# Patient Record
Sex: Female | Born: 1985
Health system: Southern US, Community
[De-identification: ages and names within clinical notes are randomized; demographics above are authoritative.]

## PROBLEM LIST (undated history)

## (undated) DIAGNOSIS — N926 Irregular menstruation, unspecified: Secondary | ICD-10-CM

## (undated) DIAGNOSIS — J45909 Unspecified asthma, uncomplicated: Secondary | ICD-10-CM

## (undated) DIAGNOSIS — N941 Unspecified dyspareunia: Secondary | ICD-10-CM

## (undated) DIAGNOSIS — E559 Vitamin D deficiency, unspecified: Secondary | ICD-10-CM

## (undated) DIAGNOSIS — F419 Anxiety disorder, unspecified: Secondary | ICD-10-CM

## (undated) DIAGNOSIS — D649 Anemia, unspecified: Secondary | ICD-10-CM

## (undated) DIAGNOSIS — R0602 Shortness of breath: Secondary | ICD-10-CM

## (undated) DIAGNOSIS — G43909 Migraine, unspecified, not intractable, without status migrainosus: Secondary | ICD-10-CM

## (undated) DIAGNOSIS — D4959 Neoplasm of unspecified behavior of other genitourinary organ: Secondary | ICD-10-CM

## (undated) DIAGNOSIS — D219 Benign neoplasm of connective and other soft tissue, unspecified: Secondary | ICD-10-CM

## (undated) DIAGNOSIS — E538 Deficiency of other specified B group vitamins: Secondary | ICD-10-CM

## (undated) DIAGNOSIS — R51 Headache: Secondary | ICD-10-CM

## (undated) DIAGNOSIS — Z87898 Personal history of other specified conditions: Secondary | ICD-10-CM

## (undated) DIAGNOSIS — IMO0002 Reserved for concepts with insufficient information to code with codable children: Secondary | ICD-10-CM

## (undated) HISTORY — DX: Benign neoplasm of connective and other soft tissue, unspecified: D21.9

## (undated) HISTORY — DX: Shortness of breath: R06.02

## (undated) HISTORY — DX: Personal history of other specified conditions: Z87.898

## (undated) HISTORY — DX: Vitamin D deficiency, unspecified: E55.9

## (undated) HISTORY — DX: Unspecified dyspareunia: N94.10

## (undated) HISTORY — DX: Deficiency of other specified B group vitamins: E53.8

## (undated) HISTORY — DX: Unspecified asthma, uncomplicated: J45.909

## (undated) HISTORY — DX: Anxiety disorder, unspecified: F41.9

## (undated) HISTORY — DX: Migraine, unspecified, not intractable, without status migrainosus: G43.909

## (undated) HISTORY — PX: HYSTEROSCOPY: SHX211

## (undated) HISTORY — DX: Reserved for concepts with insufficient information to code with codable children: IMO0002

## (undated) HISTORY — DX: Anemia, unspecified: D64.9

## (undated) HISTORY — DX: Irregular menstruation, unspecified: N92.6

---

## 2006-05-10 ENCOUNTER — Emergency Department: Payer: Self-pay | Admitting: Emergency Medicine

## 2006-08-10 ENCOUNTER — Emergency Department: Payer: Self-pay | Admitting: Emergency Medicine

## 2007-03-16 ENCOUNTER — Emergency Department: Payer: Self-pay | Admitting: Emergency Medicine

## 2008-04-06 ENCOUNTER — Emergency Department: Payer: Self-pay | Admitting: Emergency Medicine

## 2010-11-15 DIAGNOSIS — IMO0002 Reserved for concepts with insufficient information to code with codable children: Secondary | ICD-10-CM

## 2010-11-15 HISTORY — DX: Reserved for concepts with insufficient information to code with codable children: IMO0002

## 2010-12-20 ENCOUNTER — Other Ambulatory Visit: Payer: Self-pay | Admitting: Internal Medicine

## 2010-12-20 ENCOUNTER — Other Ambulatory Visit: Payer: Self-pay | Admitting: Obstetrics and Gynecology

## 2010-12-20 DIAGNOSIS — R1900 Intra-abdominal and pelvic swelling, mass and lump, unspecified site: Secondary | ICD-10-CM

## 2011-01-07 ENCOUNTER — Ambulatory Visit
Admission: RE | Admit: 2011-01-07 | Discharge: 2011-01-07 | Disposition: A | Discharge status: Home / Self Care | Payer: 59 | Source: Ambulatory Visit | Attending: Obstetrics and Gynecology | Admitting: Obstetrics and Gynecology

## 2011-01-07 DIAGNOSIS — R1900 Intra-abdominal and pelvic swelling, mass and lump, unspecified site: Secondary | ICD-10-CM

## 2011-01-07 MED ORDER — GADOBENATE DIMEGLUMINE 529 MG/ML IV SOLN
14.0000 mL | Freq: Once | INTRAVENOUS | Status: AC | PRN
  Administered 2011-01-07: 14 mL via INTRAVENOUS

## 2011-01-27 ENCOUNTER — Other Ambulatory Visit: Payer: Self-pay

## 2011-01-31 ENCOUNTER — Inpatient Hospital Stay (HOSPITAL_COMMUNITY)
Admission: AD | Admit: 2011-01-31 | Discharge: 2011-01-31 | Disposition: A | Discharge status: Home / Self Care | Payer: 59 | Source: Ambulatory Visit | Attending: Obstetrics and Gynecology | Admitting: Obstetrics and Gynecology

## 2011-01-31 ENCOUNTER — Encounter (HOSPITAL_COMMUNITY): Payer: Self-pay | Admitting: *Deleted

## 2011-01-31 ENCOUNTER — Other Ambulatory Visit: Payer: Self-pay | Admitting: Obstetrics and Gynecology

## 2011-01-31 DIAGNOSIS — D649 Anemia, unspecified: Secondary | ICD-10-CM | POA: Insufficient documentation

## 2011-01-31 DIAGNOSIS — R5383 Other fatigue: Secondary | ICD-10-CM

## 2011-01-31 DIAGNOSIS — D259 Leiomyoma of uterus, unspecified: Secondary | ICD-10-CM | POA: Insufficient documentation

## 2011-01-31 HISTORY — DX: Headache: R51

## 2011-01-31 HISTORY — DX: Neoplasm of unspecified behavior of other genitourinary organ: D49.59

## 2011-01-31 LAB — LIPASE, BLOOD: Lipase: 17 U/L (ref 11–59)

## 2011-01-31 LAB — COMPREHENSIVE METABOLIC PANEL
ALT: 51 U/L — ABNORMAL HIGH (ref 0–35)
CO2: 26 mEq/L (ref 19–32)
Calcium: 9.1 mg/dL (ref 8.4–10.5)
Creatinine, Ser: 0.87 mg/dL (ref 0.50–1.10)
GFR calc Af Amer: >90 mL/min (ref >90)
GFR calc non Af Amer: >90 mL/min (ref >90)
Glucose, Bld: 105 mg/dL — ABNORMAL HIGH (ref 70–99)

## 2011-01-31 LAB — DIFFERENTIAL
Eosinophils Relative: 2 % (ref 0–5)
Lymphocytes Relative: 35 % (ref 12–46)
Lymphs Abs: 2.2 10*3/uL (ref 0.7–4.0)
Monocytes Absolute: 0.1 10*3/uL (ref 0.1–1.0)

## 2011-01-31 LAB — CBC
HCT: 19.7 % — ABNORMAL LOW (ref 36.0–46.0)
Hemoglobin: 6.8 g/dL — CL (ref 12.0–15.0)
MCV: 109.4 fL — ABNORMAL HIGH (ref 78.0–100.0)
RBC: 1.8 MIL/uL — ABNORMAL LOW (ref 3.87–5.11)
WBC: 6.4 10*3/uL (ref 4.0–10.5)

## 2011-01-31 LAB — POCT PREGNANCY, URINE: Preg Test, Ur: NEGATIVE

## 2011-01-31 LAB — FOLATE: Folate: 5.5 ng/mL

## 2011-01-31 MED ORDER — LEUPROLIDE ACETATE (3 MONTH) 11.25 MG IM KIT
11.2500 mg | PACK | Freq: Once | INTRAMUSCULAR | Status: AC
Start: 2011-01-31 — End: 2011-01-31
  Administered 2011-01-31: 11.25 mg via INTRAMUSCULAR
  Filled 2011-01-31: qty 11.25

## 2011-01-31 MED ORDER — LACTATED RINGERS IV BOLUS (SEPSIS)
500.0000 mL | Freq: Once | INTRAVENOUS | Status: AC
  Administered 2011-01-31: 500 mL via INTRAVENOUS

## 2011-01-31 MED ORDER — LACTATED RINGERS IV SOLN
INTRAVENOUS | Status: DC
  Administered 2011-01-31: 12:00:00 via INTRAVENOUS

## 2011-01-31 MED ORDER — LEUPROLIDE ACETATE (3 MONTH) 11.25 MG IM KIT: 11.2500 mg | PACK | Freq: Once | INTRAMUSCULAR | Status: DC

## 2011-01-31 NOTE — ED Provider Notes (Addendum)
History   25yo well known by me in the office presents today after being seeing yesterday in office by Henreitta Leber, PA with c/o body aches prior to hysteroscopy and fatigue, especially with exertion and prolonged bleeding since October.  Bleeding has not been heavy but has been chronic.  She normally changes her pad 2-3 times per day.  Pt has also felt a little nausea and reported constipation until yesterday when she had a normal BM.  She reports positive flatus and is eating ok.  Denies fever, chills, nausea today or any bleeding today.  She had a negative pregnancy test in the office about 1-2 wks ago and has not had sex since August.  She had a normal pap smear and negative GC/CT in October.  W/u in office revealed normal TSH in November and Hct of approx 30.  An MRI revealed a 6cm fundal fibroid.  She c/o pain when she is bleeding.  The last bleeding episode she had was over the weekend but still only reported changing her pad 2-3x each day.  Chief Complaint  Patient presents with  . Anemia   HPI  OB History    Grav Para Term Preterm Abortions TAB SAB Ect Mult Living   0               Past Medical History  Diagnosis Date  . 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 Headache (reports no aura but will confirm with Neuro in March)     Past Surgical History  Procedure Date  . Hysteroscopy 01/19/11    History reviewed. No pertinent family history.  History  Substance Use Topics  . Smoking status: Never Smoker   . Smokeless tobacco: Not on file  . Alcohol Use: No    Allergies: No Known Allergies  Prescriptions prior to admission  Medication Sig Dispense Refill  . baclofen (LIORESAL) 10 MG tablet Take 10 mg by mouth 2 (two) times daily as needed. Muscle spasms       . chlorproMAZINE (THORAZINE) 25 MG tablet Take 25-50 mg by mouth daily as needed. For severe migraines       . HYDROcodone-acetaminophen (VICODIN) 2.5-500 MG per tablet Take 1 tablet by  mouth every 6 (six) hours as needed. For pain       . ibuprofen (ADVIL,MOTRIN) 800 MG tablet Take 800 mg by mouth every 8 (eight) hours as needed. For pain       . levonorgestrel-ethinyl estradiol (SEASONALE,INTROVALE,JOLESSA) 0.15-0.03 MG tablet Take 1 tablet by mouth daily.        . promethazine (PHENERGAN) 25 MG tablet Take 25 mg by mouth every 6 (six) hours as needed. nausea       . topiramate (TOPAMAX) 25 MG tablet Take 75 mg by mouth daily.          ROS As above.  Denies CP or SOB.  Physical Exam   Blood pressure 119/74, pulse 108, temperature 99.3 F (37.4 C), temperature source Oral, resp. rate 18, height 5' 6.5" (1.689 m), weight 73.211 kg (161 lb 6.4 oz), SpO2 98.00%.  Physical Exam Lungs CTA bil CV RRR ABD soft, nt, nabs EXT no calf tenderness  MAU Course  Procedures IVF Check labs UPT Depo Lupron   Assessment and Plan  25yo G0 with Symptomatic Anemia and Fundal fibroid.  Pt offered blood transfusion and different options for management.  The risks, benefits and alternatives discussed.  Pt says she has a class today  at 5:30p and wants to go but she wants the transfusion because she is symptomatic but she wants to return in am for it.  I informed patient that I did not think it was a good idea to drive by herself and recommended against it.  I even offered getting one unit today and the remaining two units tomorrow.  However, she would rather just do all three tomorrow and says she will be fine.  I informed her with the symptoms she is having she could possibly become light headed or dizzy at the wheel but still declines any transfusions for today.  She would like to get depo lupron however and reeval in about 3months for possible surgical management.  Initially discussed micronor for mgmt but pt would like to optimize her condition to be able to take her courses for this semester and reeval in a few months.  Pt has f/u in office schedule for 02/15/11 and instructed to keep  that appt.  Side effects of depo lupron reviewed including but not limited to bleeding, headaches, hot flashes, blood clots, mood swings, amenorrhea.... Also, it is not birth control and she should use condoms or some other birth control method and stop her seasonale.  Rec OTC iron BID.  Barrett Holthaus Y 01/31/2011, 1:25 PM   02/01/11 vitamin B12 was low at 95. Folate was normal.  Hepatitis panel still pending.  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  When pt presents for blood transfusion, I will also give of vitamin B12 after discussion and recommendations by pharmacist for dosing until pt can be seen by hematologist.

## 2011-01-31 NOTE — Progress Notes (Signed)
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  Had hysteroscopy on 01/19/11.  Still having bleeding since then.  Called the office yesterday for weakness and fatigue, had blood work drawn, was called this am and told results were abnormal and she was anemic and to come to MAU.  Pt does not know any of the results.

## 2011-01-31 NOTE — Progress Notes (Signed)
Patient states she has had bleeding since October. Was seen at [facility] 1-7 for lab work. Patient states she was called and told to come to MAU. Patient has been feeling tired and weak, Has some cramping on and off but none today. Was diagnosed with an ovarian tumor but has not had a discussion with Dr. Su Hilt about it.

## 2011-02-01 ENCOUNTER — Observation Stay (HOSPITAL_COMMUNITY)
Admission: AD | Admit: 2011-02-01 | Discharge: 2011-02-02 | Disposition: A | Discharge status: Home / Self Care | Payer: 59 | Source: Ambulatory Visit | Attending: Obstetrics and Gynecology | Admitting: Obstetrics and Gynecology

## 2011-02-01 ENCOUNTER — Encounter (HOSPITAL_COMMUNITY): Payer: Self-pay | Admitting: *Deleted

## 2011-02-01 ENCOUNTER — Other Ambulatory Visit: Payer: Self-pay | Admitting: Obstetrics and Gynecology

## 2011-02-01 DIAGNOSIS — D649 Anemia, unspecified: Principal | ICD-10-CM | POA: Insufficient documentation

## 2011-02-01 DIAGNOSIS — D518 Other vitamin B12 deficiency anemias: Secondary | ICD-10-CM | POA: Insufficient documentation

## 2011-02-01 LAB — TYPE AND SCREEN
Antibody Screen: NEGATIVE
Unit division: 0
Unit division: 0

## 2011-02-01 LAB — HEPATITIS PANEL, ACUTE: Hep A IgM: NEGATIVE

## 2011-02-01 LAB — CBC
MCV: 108.6 fL — ABNORMAL HIGH (ref 78.0–100.0)
Platelets: 197 10*3/uL (ref 150–400)
RBC: 1.74 MIL/uL — ABNORMAL LOW (ref 3.87–5.11)
WBC: 7.6 10*3/uL (ref 4.0–10.5)

## 2011-02-01 LAB — PREPARE RBC (CROSSMATCH)

## 2011-02-01 MED ORDER — DIPHENHYDRAMINE HCL 25 MG PO CAPS
25.0000 mg | ORAL_CAPSULE | Freq: Once | ORAL | Status: AC
  Administered 2011-02-01: 25 mg via ORAL
  Filled 2011-02-01: qty 1

## 2011-02-01 MED ORDER — VITAMIN B-12 1000 MCG PO TABS
1000.0000 ug | ORAL_TABLET | Freq: Once | ORAL | Status: AC
  Administered 2011-02-01: 1000 ug via ORAL
  Filled 2011-02-01: qty 1

## 2011-02-01 MED ORDER — ACETAMINOPHEN 325 MG PO TABS
650.0000 mg | ORAL_TABLET | Freq: Once | ORAL | Status: AC
  Administered 2011-02-01: 650 mg via ORAL
  Filled 2011-02-01: qty 2

## 2011-02-01 NOTE — Progress Notes (Signed)
Pt began complaining about IV site burning 10 minutes after blood transfusion rate increased to 150cc/hr.  Heat pack applied to IV site, but did not relieve symptom.  IV site appeared normal.  Blood transfusion rate reduced to 100cc/hr.  30 minutes later pt was sleeping.

## 2011-02-02 ENCOUNTER — Other Ambulatory Visit (HOSPITAL_COMMUNITY): Payer: Self-pay | Admitting: Obstetrics and Gynecology

## 2011-02-02 NOTE — Progress Notes (Signed)
Pt discharged home... Condition stable... No equipment... Ambulated to car with Payden Bonus Toms, RN.  

## 2011-02-02 NOTE — Progress Notes (Signed)
Called Dr. Normand Sloop to report that the third unit of blood had finished transfusing. Orders given to D/C pt and have her follow up with Dr. Su Hilt as scheduled. Pt stated that she has an appointment with Dr. Su Hilt on January 23rd, 2013 at 0800. Pt verbalized understanding.

## 2011-02-06 ENCOUNTER — Telehealth: Payer: Self-pay | Admitting: Hematology and Oncology

## 2011-02-06 NOTE — Telephone Encounter (Signed)
lmonvm for pt re calling me for appt w/LO 

## 2011-02-07 ENCOUNTER — Telehealth: Payer: Self-pay | Admitting: Hematology and Oncology

## 2011-02-07 NOTE — Telephone Encounter (Signed)
lmonvm for pt re calling me for appt w/LO 

## 2011-02-08 ENCOUNTER — Telehealth: Payer: Self-pay | Admitting: Hematology and Oncology

## 2011-02-08 NOTE — Telephone Encounter (Signed)
S/w pt today re appt for 1/23 @ 10 am w/LO

## 2011-02-09 NOTE — H&P (Signed)
Patient presented to follow through with care from the previous day when she presented to [facility]U.  She was unable to stay yesterday evening.  26yo well known by me in the office presents today after being seeing yesterday in office by Henreitta Leber, PA with c/o body aches prior to hysteroscopy and fatigue, especially with exertion and prolonged bleeding since October.  Bleeding has not been heavy but has been chronic.  She normally changes her pad 2-3 times per day.  Pt has also felt a little nausea and reported constipation until yesterday when she had a normal BM.  She reports positive flatus and is eating ok.  Denies fever, chills, nausea today or any bleeding today.  She had a negative pregnancy test in the office about 1-2 wks ago and has not had sex since August.  She had a normal pap smear and negative GC/CT in October.  W/u in office revealed normal TSH in November and Hct of approx 30.  An MRI revealed a 6cm fundal fibroid.  She c/o pain when she is bleeding.  The last bleeding episode she had was over the weekend but still only reported changing her pad 2-3x each day.    Chief Complaint   Patient presents with   .  Anemia    HPI    OB History      Grav  Para  Term  Preterm  Abortions  TAB  SAB  Ect  Mult  Living     0                        Past Medical History   Diagnosis  Date   .  Possible Ovarian tumor found to be fundal fibroid on MRI  12/2010   .  Headache (reports no aura but will confirm with Neuro in March)      Past Surgical History   Procedure  Date   .  Hysteroscopy  01/19/11    History reviewed. No pertinent family history.    History   Substance Use Topics   .  Smoking status:  Never Smoker    .  Smokeless tobacco:  Not on file   .  Alcohol Use:  No    Allergies: No Known Allergies    Prescriptions prior to admission   Medication  Sig  Dispense  Refill   .  baclofen (LIORESAL) 10 MG tablet  Take 10 mg by mouth 2 (two) times daily as needed. Muscle spasms           .  chlorproMAZINE (THORAZINE) 25 MG tablet  Take 25-50 mg by mouth daily as needed. For severe migraines          .  HYDROcodone-acetaminophen (VICODIN) 2.5-500 MG per tablet  Take 1 tablet by mouth every 6 (six) hours as needed. For pain          .  ibuprofen (ADVIL,MOTRIN) 800 MG tablet  Take 800 mg by mouth every 8 (eight) hours as needed. For pain          .  levonorgestrel-ethinyl estradiol (SEASONALE,INTROVALE,JOLESSA) 0.15-0.03 MG tablet  Take 1 tablet by mouth daily.           .  promethazine (PHENERGAN) 25 MG tablet  Take 25 mg by mouth every 6 (six) hours as needed. nausea          .  topiramate (TOPAMAX) 25 MG tablet  Take 75 mg by mouth daily.  ROS As above.  Denies CP or SOB.    Physical Exam    Blood pressure 119/74, pulse 108, temperature 99.3 F (37.4 C), temperature source Oral, resp. rate 18, height 5' 6.5" (1.689 m), weight 73.211 kg (161 lb 6.4 oz), SpO2 98.00%.  Lungs CTA bil CV RRR ABD soft, nt, nabs EXT no calf tenderness    MAU Course    Procedures IVF Check labs UPT Depo Lupron  Assessment and Plan    26yo G0 with Symptomatic Anemia and Fundal fibroid.  Pt offered blood transfusion and different options for management.  The risks, benefits and alternatives discussed.  Pt says she has a class today at 5:30p and wants to go but she wants the transfusion because she is symptomatic but she wants to return in am for it.  I informed patient that I did not think it was a good idea to drive by herself and recommended against it.  I even offered getting one unit today and the remaining two units tomorrow.  However, she would rather just do all three tomorrow and says she will be fine.  I informed her with the symptoms she is having she could possibly become light headed or dizzy at the wheel but still declines any transfusions for today.  She would like to get depo lupron however and reeval in about 3months for possible surgical management.  Initially  discussed micronor for mgmt but pt would like to optimize her condition to be able to take her courses for this semester and reeval in a few months.  Pt has f/u in office schedule for 02/15/11 and instructed to keep that appt.  Side effects of depo lupron reviewed including but not limited to bleeding, headaches, hot flashes, blood clots, mood swings, amenorrhea.... Also, it is not birth control and she should use condoms or some other birth control method and stop her seasonale.  Rec OTC iron BID.   Riese Hellard Y 01/31/2011, 1:25 PM    02/01/11 vitamin B12 was low at 95. Folate was normal.  Hepatitis panel still pending.  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  When pt presents for blood transfusion, I will also give of vitamin B12 after discussion and recommendations by pharmacist for dosing until pt can be seen by hematologist.

## 2011-02-09 NOTE — Discharge Summary (Signed)
Physician Discharge Summary  Patient ID: Teresa Brennan MRN: [id number] DOB/AGE: 25/01/1985 25 y.o.  Admit date: 02/01/2011 Discharge date: 02/09/2011  Admission Diagnoses: Symptomatic Anemia and Vitamin B Deficiency  Discharge Diagnoses: s/p Blood Transfusion  Active Problems:  See admitting diagnoses   Discharged Condition: stable  Hospital Course: s/p blood transfusion  Consults: none  Significant Diagnostic Studies: none  Treatments: transfusion  Discharge Exam: Blood pressure 108/74, pulse 97, temperature 98.7 F (37.1 C), temperature source Oral, resp. rate 18, SpO2 100.00%. PE see admission note  Disposition: Home or Self Care  Discharge Orders    Future Appointments: Provider: Department: Dept Phone: Center:   02/15/2011 10:00 AM [facility] [phone] None   02/15/2011 10:30 AM Lauretta I. Odogwu, MD [facility] [phone] None     Medication List  As of 02/09/2011  3:32 PM   TAKE these medications         baclofen 10 MG tablet   Commonly known as: LIORESAL   Take 10 mg by mouth 2 (two) times daily as needed. Muscle spasms      chlorproMAZINE 25 MG tablet   Commonly known as: THORAZINE   Take 25-50 mg by mouth daily as needed. For severe migraines      HYDROcodone-acetaminophen 2.5-500 MG per tablet   Commonly known as: VICODIN   Take 1 tablet by mouth every 6 (six) hours as needed. For pain      ibuprofen 800 MG tablet   Commonly known as: ADVIL,MOTRIN   Take 800 mg by mouth every 8 (eight) hours as needed. For pain      levonorgestrel-ethinyl estradiol 0.15-0.03 MG tablet   Commonly known as: SEASONALE,INTROVALE,JOLESSA   Take 1 tablet by mouth daily.      promethazine 25 MG tablet   Commonly known as: PHENERGAN   Take 25 mg by mouth every 6 (six) hours as needed. nausea      topiramate 25 MG tablet   Commonly known as: TOPAMAX   Take 75 mg by mouth daily.             SignedPurcell Nails 02/09/2011, 3:32 PM

## 2011-02-10 ENCOUNTER — Telehealth: Payer: Self-pay | Admitting: Hematology and Oncology

## 2011-02-10 NOTE — Telephone Encounter (Signed)
Referred by Dr. Osborn Coho Dx- Anemia

## 2011-02-13 ENCOUNTER — Telehealth: Payer: Self-pay | Admitting: Hematology and Oncology

## 2011-02-13 NOTE — Telephone Encounter (Signed)
Pt called to r/s 1/23 new pt appt for 1/25 @ 1 pm due to conflicting appts.

## 2011-02-15 ENCOUNTER — Ambulatory Visit: Payer: 59

## 2011-02-15 ENCOUNTER — Ambulatory Visit: Payer: 59 | Admitting: Hematology and Oncology

## 2011-02-17 ENCOUNTER — Ambulatory Visit (HOSPITAL_BASED_OUTPATIENT_CLINIC_OR_DEPARTMENT_OTHER): Payer: 59

## 2011-02-17 ENCOUNTER — Ambulatory Visit: Payer: 59

## 2011-02-17 ENCOUNTER — Ambulatory Visit (HOSPITAL_BASED_OUTPATIENT_CLINIC_OR_DEPARTMENT_OTHER): Payer: 59 | Admitting: Hematology and Oncology

## 2011-02-17 ENCOUNTER — Telehealth: Payer: Self-pay | Admitting: Hematology and Oncology

## 2011-02-17 VITALS — BP 124/85 | HR 111 | Temp 99.7°F | Ht 66.5 in | Wt 161.7 lb

## 2011-02-17 DIAGNOSIS — D539 Nutritional anemia, unspecified: Secondary | ICD-10-CM

## 2011-02-17 DIAGNOSIS — E538 Deficiency of other specified B group vitamins: Secondary | ICD-10-CM

## 2011-02-17 DIAGNOSIS — D510 Vitamin B12 deficiency anemia due to intrinsic factor deficiency: Secondary | ICD-10-CM

## 2011-02-17 DIAGNOSIS — R7989 Other specified abnormal findings of blood chemistry: Secondary | ICD-10-CM

## 2011-02-17 DIAGNOSIS — D649 Anemia, unspecified: Secondary | ICD-10-CM

## 2011-02-17 DIAGNOSIS — R1011 Right upper quadrant pain: Secondary | ICD-10-CM

## 2011-02-17 LAB — CBC WITH DIFFERENTIAL/PLATELET
Eosinophils Absolute: 0.2 10*3/uL (ref 0.0–0.5)
LYMPH%: 33.5 % (ref 14.0–49.7)
MONO#: 0.3 10*3/uL (ref 0.1–0.9)
NEUT#: 6 10*3/uL (ref 1.5–6.5)
Platelets: 561 10*3/uL — ABNORMAL HIGH (ref 145–400)
RBC: 3.05 10*6/uL — ABNORMAL LOW (ref 3.70–5.45)
RDW: 26.2 % — ABNORMAL HIGH (ref 11.2–14.5)
WBC: 9.9 10*3/uL (ref 3.9–10.3)
lymph#: 3.3 10*3/uL (ref 0.9–3.3)

## 2011-02-17 LAB — COMPREHENSIVE METABOLIC PANEL
ALT: 19 U/L (ref 0–35)
AST: 19 U/L (ref 0–37)
CO2: 29 mEq/L (ref 19–32)
Calcium: 10 mg/dL (ref 8.4–10.5)
Chloride: 101 mEq/L (ref 96–112)
Sodium: 138 mEq/L (ref 135–145)
Total Protein: 8.2 g/dL (ref 6.0–8.3)

## 2011-02-17 LAB — MORPHOLOGY

## 2011-02-17 NOTE — Telephone Encounter (Signed)
Gv pt appt for [id number].  scheduled pt for u/s abdomen for 02/27/2011 @ 9:30am @ [facility]

## 2011-02-17 NOTE — Progress Notes (Signed)
This office note has been dictated.

## 2011-02-17 NOTE — Progress Notes (Signed)
CC:   Teresa Brennan, M.D. Teresa Glad, MD  REFERRING PHYSICIAN:  [name], M.D.  IDENTIFYING STATEMENT:  The patient is a 26 year old woman seen at request of Dr. Su Hilt with anemia.  HISTORY OF PRESENT ILLNESS:  The patient was seen by Dr. Su Hilt with complaints of generalized fatigue with progressive shortness of breath. She also had complaint of weight loss of approximately 10 pounds with lack in appetite, nausea and vomiting.  She also gives a history of right-sided upper quadrant pain, worse on inspiration.  She has had history of chronic vaginal bleeding for 3 months.  And she received an MRI of the pelvis followed by a hysteroscopy on 01/19/2011 that showed a fundal fibroid.  On presentation, hemoglobin and hematocrit was found to be 7.1 and 20.4 on November 30, 2011.  Her vitamin B12 was low at 95.  AST and ALT were increased to 72 and 45, respectively.  The patient received 3 units of packed red blood cells.  She also received a vitamin B12 injection.  The patient notes that most of her GI symptoms have resolved, and she has noted improvement in her overall well being since her blood transfusion. She was given Depo-Lupron to help control irregular period.  She also describes easy bruising. She is adopted and thus is not able to give a family history.  PAST MEDICAL HISTORY:  Anemia, fibroids.  ALLERGIES:  None.  MEDICATIONS:  Torsemide 2 tablets daily, vitamin D 1 tab 2 times a week, and ibuprofen 800 mg p.r.n.  FAMILY HISTORY:  Adopted. She has a twin sister.  SOCIAL HISTORY:  Denies alcohol or tobacco use.  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  REVIEW OF SYSTEMS:  Currently denies fever, chills, night sweats, or anorexia. Weight loss stabilized.  Denies significant pain.  GI: Denies nausea, vomiting, abdominal pain, diarrhea, melena, or hematochezia. GU: Denies dysuria, hematuria, nocturia, or frequency.  Cardiovascular: Denies chest pain, PND,  orthopnea, or ankle swelling.  Respirations: Denies cough, hemoptysis, wheeze, or shortness of breath.  The rest review of systems negative.  PHYSICAL EXAM:  General: The patient is alert and oriented x3.  Vitals: Pulse 111, blood pressure 124/85, temperature 99.7, respirations 20, and weight 261 pounds.  HEENT: Head is atraumatic, normocephalic.  Sclerae anicteric.  Pupils are equal, round, and reactive to light.  Mouth moist without ulcerations, thrush, or lesions.  Neck:  Supple without adenopathy and trachea center.  Chest: Demonstrates good entry bilaterally and clear to both percussion auscultation.  Cardiovascular exam: Reveals 1st and 2nd heart sounds are present.  No added sounds or murmurs.  ABDOMEN:  Soft, nontender.  No hepatosplenomegaly.  No masses. Bowel sounds present.  Extremities: No calf tenderness.  Pulses present and symmetrical.  CNS: Nonfocal.  IMPRESSION AND PLAN:  Teresa Brennan is a 26 year old woman with anemia. She is somewhat being worked up by Dr. Su Hilt and was found to be significantly B12 deficient.  According to the notes, vitamin B12 1000 mg was given a week-and-a-half ago.  I would like to have her worked up a little further.  I will have her return to lab to obtain a CBC with diff with morphology, and will repeat vitamin B12 level.  In addition, we will obtain a methylmalonic  acid level and serum folate levels.  We will also obtain an intrinsic factor antibody level.  Of some significance is a history for heavy menses and easy bruisability that we will need to rule out for Willebrand disease  with von Willebrand panel. She is not quite sure if she has history of sickle cell disease.  We will obtain a hemoglobin electrophoresis panel.  We will also obtain thyroid function tests as she does have a history for chronic a constipation.  We will also attach an anemia panel to patient's workup. Her liver panel tests were slightly elevated, and she does  have complaint of right-sided upper quadrant pain.  She will proceed with an ultrasound of the liver, and we will obtain hepatitis panel.  I agree with Depo-Lupron to help manage her periods.  She is here with her significant other, and they both had a number of questions which were answered as to the likely cause of her anemia and possible treatments. I have her returning in a week-and-a-half time to discuss results.  In the interim, I have asked that she begin vitamin B12 1000 mcg sublingually daily.    I spent more than half the time coordinating care.    ______________________________ Laurice Record, M.D. LIO/MEDQ  D:  02/17/2011  T:  02/17/2011  Job:  [id number]

## 2011-02-17 NOTE — Progress Notes (Signed)
NO    PRIMARY    -     Goes to  Urgent Care for medical issues. Dr.    Osborn Coho    -      [facility]   -     For   Migraine.  [facility]    [facility]armacy  On   [address].  Cell     Phone       [phone].

## 2011-02-21 LAB — INTRINSIC FACTOR ANTIBODIES: Intrinsic Factor: POSITIVE — AB

## 2011-02-26 LAB — HEPATITIS PANEL, ACUTE
HCV Ab: NEGATIVE
Hep A IgM: NEGATIVE

## 2011-02-26 LAB — IRON AND TIBC
%SAT: 14 % — ABNORMAL LOW (ref 20–55)
TIBC: 345 ug/dL (ref 250–470)
UIBC: 297 ug/dL (ref 125–400)

## 2011-02-26 LAB — METHYLMALONIC ACID, SERUM: Methylmalonic Acid, Quant: 12.64 umol/L — ABNORMAL HIGH (ref <0.40)

## 2011-02-26 LAB — HEMOGLOBINOPATHY EVALUATION
Hemoglobin Other: 0 %
Hgb A: 96.4 % — ABNORMAL LOW (ref 96.8–97.8)

## 2011-02-26 LAB — FERRITIN: Ferritin: 416 ng/mL — ABNORMAL HIGH (ref 10–291)

## 2011-02-27 ENCOUNTER — Ambulatory Visit (HOSPITAL_COMMUNITY)
Admission: RE | Admit: 2011-02-27 | Discharge: 2011-02-27 | Disposition: A | Discharge status: Home / Self Care | Payer: 59 | Source: Ambulatory Visit | Attending: Hematology and Oncology | Admitting: Hematology and Oncology

## 2011-02-27 DIAGNOSIS — R1011 Right upper quadrant pain: Secondary | ICD-10-CM | POA: Insufficient documentation

## 2011-03-10 ENCOUNTER — Other Ambulatory Visit: Payer: 59 | Admitting: Lab

## 2011-03-10 ENCOUNTER — Encounter: Payer: Self-pay | Admitting: Nurse Practitioner

## 2011-03-10 ENCOUNTER — Telehealth: Payer: Self-pay | Admitting: Hematology and Oncology

## 2011-03-10 ENCOUNTER — Ambulatory Visit (HOSPITAL_BASED_OUTPATIENT_CLINIC_OR_DEPARTMENT_OTHER): Payer: 59 | Admitting: Hematology and Oncology

## 2011-03-10 ENCOUNTER — Encounter: Payer: Self-pay | Admitting: Hematology and Oncology

## 2011-03-10 VITALS — BP 124/79 | HR 112 | Temp 98.6°F | Ht 66.5 in | Wt 159.0 lb

## 2011-03-10 DIAGNOSIS — E538 Deficiency of other specified B group vitamins: Secondary | ICD-10-CM

## 2011-03-10 DIAGNOSIS — D649 Anemia, unspecified: Secondary | ICD-10-CM | POA: Insufficient documentation

## 2011-03-10 DIAGNOSIS — D510 Vitamin B12 deficiency anemia due to intrinsic factor deficiency: Secondary | ICD-10-CM

## 2011-03-10 DIAGNOSIS — D539 Nutritional anemia, unspecified: Secondary | ICD-10-CM

## 2011-03-10 DIAGNOSIS — Z87898 Personal history of other specified conditions: Secondary | ICD-10-CM

## 2011-03-10 HISTORY — DX: Personal history of other specified conditions: Z87.898

## 2011-03-10 MED ORDER — CYANOCOBALAMIN 1000 MCG/ML IJ SOLN
1000.0000 ug | Freq: Once | INTRAMUSCULAR | Status: AC
  Administered 2011-03-10: 1000 ug via INTRAMUSCULAR

## 2011-03-10 NOTE — Progress Notes (Signed)
Forwarded FMLA papers to [employer] in case management dept for completion.

## 2011-03-10 NOTE — Progress Notes (Signed)
CC:   Osborn Coho, M.D. [name], MD  IDENTIFYING STATEMENT:  The patient is a 26 year old woman who presents to discuss results.  INTERVAL HISTORY:  The patient was referred for generalized fatigue and was found to have profound anemia.  Hemoglobin and hematocrit on presentation at Dr. Su Hilt office was 7.1 and 20.4.  Her B12 level was also low at 95.  She received 3 units of packed red blood cells.  She also had a history of irregular menses and is felt to have fibroids. She did describe easy bruising and right upper quadrant pain.  She was evaluated with the following results.  Hemoglobin and hematocrit 10.9 and 32.2 respectively following packed RBC transfusions; platelets 561, white cell count 9.9; peripheral smear reveals macrocytic red blood cells with a few teardrop cells and target cells.  There are few hypersegmented white blood cells; BUN and creatinine 13 and 0.87, respectively; B12 level 293; methylmalonic acid 12.64; ferritin 416; iron 48, TIBC 345, saturation 14%; Factor VII 192%. Von Willebrand antigen and activity levels were 130% and 99% respectively.  The patient received an ultrasound of the abdomen that showed no gallstones, gallbladder wall thickening or focal lesions within the liver.  MEDICATIONS:  Reviewed and updated.  PAST MEDICAL HISTORY/FAMILY HISTORY/SOCIAL HISTORY:  Unchanged.  REVIEW OF SYSTEMS:  Ten point review of systems negative.  PHYSICAL EXAM:  The patient is a well-appearing, well-nourished woman in no distress.  Vitals:  Pulse 112, blood pressure 124/79, temperature 98.6, respirations 20, weight 159 pounds.  HEENT:  Head is atraumatic, normocephalic.  Sclerae anicteric.  Mouth moist.  Neck:  Supple.  Chest: Clear.  CVS:  Unremarkable.  Abdomen:  Soft, nontender.  Bowel sounds present.  Extremities:  No calf tenderness.  Lymph:  There is no adenopathy.  CNS:  Nonfocal.  LAB DATA:  As above.  In addition, white cell count 9.9,  sodium 138, potassium 3.6, chloride 101, CO2 29, glucose 87, t bilirubin 0.5, alkaline phosphatase 79, AST 19, ALT 19, calcium 10.  Intrinsic factor antibody was positive.  Folate 8.  IMPRESSION/PLAN:  Ms. Kuehnle is a pleasant 26 year old woman who has pernicious anemia.  We spent more than half the time going through her results and discussing the management.  The patient is already taking oral vitamin B12, but I suggested she receive monthly B12 injections at dose of 1000 mg.  She will find a primary care physician.  Until she does so, her injections will be scheduled through this office.  I also will have her return in 4 months' time with labs.   ______________________________ Laurice Record, M.D. LIO/MEDQ  D:  03/10/2011  T:  03/10/2011  Job:  [id number]

## 2011-03-10 NOTE — Progress Notes (Signed)
This office note has been dictated.

## 2011-03-10 NOTE — Telephone Encounter (Signed)
Gv pt appt for [id number] °

## 2011-03-10 NOTE — Progress Notes (Signed)
Put fmla papers on nurse's desk °

## 2011-03-31 ENCOUNTER — Encounter: Payer: Self-pay | Admitting: Hematology and Oncology

## 2011-03-31 NOTE — Progress Notes (Signed)
A nurse found patient's signed fmla forms in the injection room and brought them to me; I faxed them to [employer] @ [phone].

## 2011-04-05 ENCOUNTER — Ambulatory Visit (HOSPITAL_BASED_OUTPATIENT_CLINIC_OR_DEPARTMENT_OTHER): Payer: 59

## 2011-04-05 ENCOUNTER — Ambulatory Visit: Payer: 59

## 2011-04-05 VITALS — BP 113/78 | HR 82 | Temp 97.5°F

## 2011-04-05 DIAGNOSIS — E538 Deficiency of other specified B group vitamins: Secondary | ICD-10-CM

## 2011-04-05 MED ORDER — CYANOCOBALAMIN 1000 MCG/ML IJ SOLN
1000.0000 ug | Freq: Once | INTRAMUSCULAR | Status: AC
  Administered 2011-04-05: 1000 ug via INTRAMUSCULAR

## 2011-05-10 ENCOUNTER — Ambulatory Visit (HOSPITAL_BASED_OUTPATIENT_CLINIC_OR_DEPARTMENT_OTHER): Payer: 59

## 2011-05-10 VITALS — BP 112/83 | HR 92 | Temp 98.5°F

## 2011-05-10 DIAGNOSIS — E538 Deficiency of other specified B group vitamins: Secondary | ICD-10-CM

## 2011-05-10 MED ORDER — CYANOCOBALAMIN 1000 MCG/ML IJ SOLN
1000.0000 ug | Freq: Once | INTRAMUSCULAR | Status: AC
  Administered 2011-05-10: 1000 ug via INTRAMUSCULAR

## 2011-06-07 ENCOUNTER — Telehealth: Payer: Self-pay | Admitting: *Deleted

## 2011-06-07 ENCOUNTER — Ambulatory Visit: Payer: 59

## 2011-06-07 NOTE — Telephone Encounter (Signed)
Pt  FTKA for B12 injection today.   Spoke with pt at home and was informed that pt forgot.  Gave pt appt at 845 am  06/08/11  For B12 injection.   Pt stated she would be coming for appt.

## 2011-06-08 ENCOUNTER — Ambulatory Visit (HOSPITAL_BASED_OUTPATIENT_CLINIC_OR_DEPARTMENT_OTHER): Payer: 59

## 2011-06-08 VITALS — BP 104/73 | HR 98 | Temp 97.5°F

## 2011-06-08 DIAGNOSIS — E538 Deficiency of other specified B group vitamins: Secondary | ICD-10-CM

## 2011-06-08 MED ORDER — CYANOCOBALAMIN 1000 MCG/ML IJ SOLN
1000.0000 ug | Freq: Once | INTRAMUSCULAR | Status: AC
  Administered 2011-06-08: 1000 ug via INTRAMUSCULAR

## 2011-07-05 ENCOUNTER — Other Ambulatory Visit (HOSPITAL_BASED_OUTPATIENT_CLINIC_OR_DEPARTMENT_OTHER): Payer: 59

## 2011-07-05 ENCOUNTER — Telehealth: Payer: Self-pay | Admitting: Obstetrics and Gynecology

## 2011-07-05 DIAGNOSIS — E538 Deficiency of other specified B group vitamins: Secondary | ICD-10-CM

## 2011-07-05 DIAGNOSIS — D539 Nutritional anemia, unspecified: Secondary | ICD-10-CM

## 2011-07-05 DIAGNOSIS — D510 Vitamin B12 deficiency anemia due to intrinsic factor deficiency: Secondary | ICD-10-CM

## 2011-07-05 LAB — CBC WITH DIFFERENTIAL/PLATELET
Basophils Absolute: 0.1 10*3/uL (ref 0.0–0.1)
EOS%: 1.6 % (ref 0.0–7.0)
Eosinophils Absolute: 0.1 10*3/uL (ref 0.0–0.5)
HGB: 14.1 g/dL (ref 11.6–15.9)
LYMPH%: 30.4 % (ref 14.0–49.7)
MCH: 27.9 pg (ref 25.1–34.0)
MCV: 82.7 fL (ref 79.5–101.0)
MONO%: 7.2 % (ref 0.0–14.0)
NEUT#: 3 10*3/uL (ref 1.5–6.5)
NEUT%: 59.8 % (ref 38.4–76.8)
Platelets: 271 10*3/uL (ref 145–400)

## 2011-07-05 LAB — BASIC METABOLIC PANEL
CO2: 25 mEq/L (ref 19–32)
Chloride: 104 mEq/L (ref 96–112)
Creatinine, Ser: 0.89 mg/dL (ref 0.50–1.10)
Potassium: 4.1 mEq/L (ref 3.5–5.3)

## 2011-07-05 LAB — IRON AND TIBC
%SAT: 19 % — ABNORMAL LOW (ref 20–55)
TIBC: 310 ug/dL (ref 250–470)
UIBC: 250 ug/dL (ref 125–400)

## 2011-07-05 LAB — FERRITIN: Ferritin: 106 ng/mL (ref 10–291)

## 2011-07-05 NOTE — Telephone Encounter (Signed)
TRIAGE/RETURN CALL °

## 2011-07-05 NOTE — Telephone Encounter (Signed)
Pt has not had a f/u appointment since depo lupron in January. Scheduled f/u with Dr Su Hilt. Pt voiced understanding.

## 2011-07-12 ENCOUNTER — Ambulatory Visit (HOSPITAL_BASED_OUTPATIENT_CLINIC_OR_DEPARTMENT_OTHER): Payer: 59 | Admitting: Hematology and Oncology

## 2011-07-12 ENCOUNTER — Telehealth: Payer: Self-pay | Admitting: Hematology and Oncology

## 2011-07-12 ENCOUNTER — Encounter: Payer: Self-pay | Admitting: Hematology and Oncology

## 2011-07-12 VITALS — BP 115/79 | HR 96 | Temp 98.8°F | Ht 66.5 in | Wt 170.3 lb

## 2011-07-12 DIAGNOSIS — D539 Nutritional anemia, unspecified: Secondary | ICD-10-CM

## 2011-07-12 DIAGNOSIS — E538 Deficiency of other specified B group vitamins: Secondary | ICD-10-CM

## 2011-07-12 MED ORDER — CYANOCOBALAMIN 1000 MCG/ML IJ SOLN
1000.0000 ug | Freq: Once | INTRAMUSCULAR | Status: AC
  Administered 2011-07-12: 1000 ug via INTRAMUSCULAR

## 2011-07-12 NOTE — Patient Instructions (Signed)
Teresa Brennan  [id number]  [facility] Discharge Instructions  RECOMMENDATIONS MADE BY THE CONSULTANT AND ANY TEST RESULTS WILL BE SENT TO YOUR REFERRING DOCTOR.   EXAM FINDINGS BY MD TODAY AND SIGNS AND SYMPTOMS TO REPORT TO CLINIC OR PRIMARY MD:   Your current list of medications are: Current Outpatient Prescriptions  Medication Sig Dispense Refill  . baclofen (LIORESAL) 10 MG tablet Take 10 mg by mouth 2 (two) times daily as needed. Muscle spasms       . chlorproMAZINE (THORAZINE) 25 MG tablet Take 25-50 mg by mouth daily as needed. For severe migraines       . HYDROcodone-acetaminophen (VICODIN) 5-500 MG per tablet Take 1 tablet by mouth every 6 (six) hours as needed. Take 1 - 2 tabs q  4 - 6 hrs PRN.      Marland Kitchen ibuprofen (ADVIL,MOTRIN) 800 MG tablet Take 800 mg by mouth every 8 (eight) hours as needed. For pain       . promethazine (PHENERGAN) 25 MG tablet Take 25 mg by mouth every 6 (six) hours as needed. nausea       . topiramate (TOPAMAX) 25 MG tablet Take 75 mg by mouth daily.           INSTRUCTIONS GIVEN AND DISCUSSED:   SPECIAL INSTRUCTIONS/FOLLOW-UP:  See above.  I acknowledge that I have been informed and understand all the instructions given to me and received a copy. I do not have any more questions at this time, but understand that I may call the [facility] at [phone] during business hours should I have any further questions or need assistance in obtaining follow-up care.

## 2011-07-12 NOTE — Telephone Encounter (Signed)
Gv pt appts for [id number]

## 2011-07-12 NOTE — Progress Notes (Signed)
CC:   Osborn Coho, M.D. [name], MD  IDENTIFYING STATEMENT:  The patient is Teresa 26 year old Brennan with Teresa vitamin B12 deficiency who presents for followup.  INTERVAL HISTORY:  The patient continues to receive vitamin B12 injections monthly here at [facility].  Notes marked improvement in energy levels.  She is also supplementing oral B12.  She has no current complaints.  Lab results are below.  MEDICATIONS:  Vitamin B12 1000 mcg q. month.  Rest of medicines reviewed and updated.  ALLERGIES:  None.  PHYSICAL EXAMINATION:  Alert and oriented x3.  Vitals:  Pulse 96, blood pressure 115/79, temperature 98.8, respirations 20, weight 170.3 pounds. HEENT:  Head is atraumatic, normocephalic.  Sclerae anicteric.  Mouth moist.  Neck:  Supple.  Chest:  Clear.  CVS:  Unremarkable.  Abdomen: Soft, nontender.  Bowel sounds present.  LABORATORY DATA:  07/05/2011:  White cell count 5.1, hemoglobin 14.1, hematocrit 41.6, platelets 271, iron 60, TIBC 310, saturation 19%, ferritin 106, sodium 137, potassium 4.1, chloride 104, CO2 of 25, BUN 11, creatinine 0.89, glucose 88, calcium 9.3, serum folate 5.3.  IMPRESSION AND PLAN:  Teresa Brennan  is Teresa 26 year old Brennan with pernicious anemia.  She will continue to receive monthly B12 injections.  To achieve better absorption, I will have the patient begin sublingual oral B12 at 1000 mg daily.  She follows up in 4 months' time.    ______________________________ Laurice Record, M.D. LIO/MEDQ  D:  07/12/2011  T:  07/12/2011  Job:  [id number]

## 2011-07-12 NOTE — Progress Notes (Signed)
This office note has been dictated.

## 2011-08-04 ENCOUNTER — Ambulatory Visit (INDEPENDENT_AMBULATORY_CARE_PROVIDER_SITE_OTHER): Payer: 59 | Admitting: Obstetrics and Gynecology

## 2011-08-04 ENCOUNTER — Encounter: Payer: Self-pay | Admitting: Obstetrics and Gynecology

## 2011-08-04 VITALS — BP 104/76 | Resp 14 | Ht 67.0 in | Wt 168.0 lb

## 2011-08-04 DIAGNOSIS — N839 Noninflammatory disorder of ovary, fallopian tube and broad ligament, unspecified: Secondary | ICD-10-CM

## 2011-08-04 DIAGNOSIS — N949 Unspecified condition associated with female genital organs and menstrual cycle: Secondary | ICD-10-CM

## 2011-08-04 DIAGNOSIS — N838 Other noninflammatory disorders of ovary, fallopian tube and broad ligament: Secondary | ICD-10-CM

## 2011-08-04 DIAGNOSIS — N926 Irregular menstruation, unspecified: Secondary | ICD-10-CM

## 2011-08-04 DIAGNOSIS — D259 Leiomyoma of uterus, unspecified: Secondary | ICD-10-CM

## 2011-08-04 DIAGNOSIS — N915 Oligomenorrhea, unspecified: Secondary | ICD-10-CM

## 2011-08-04 DIAGNOSIS — N898 Other specified noninflammatory disorders of vagina: Secondary | ICD-10-CM

## 2011-08-04 DIAGNOSIS — D219 Benign neoplasm of connective and other soft tissue, unspecified: Secondary | ICD-10-CM

## 2011-08-04 LAB — POCT WET PREP (WET MOUNT): pH: 5.5

## 2011-08-04 LAB — PROLACTIN: Prolactin: 9.1 ng/mL

## 2011-08-04 LAB — TSH: TSH: 0.543 u[IU]/mL (ref 0.350–4.500)

## 2011-08-04 LAB — POCT URINE PREGNANCY: Preg Test, Ur: NEGATIVE

## 2011-08-04 MED ORDER — TINIDAZOLE 500 MG PO TABS: 2.0000 g | ORAL_TABLET | Freq: Every day | ORAL | Status: AC

## 2011-08-04 MED ORDER — MEDROXYPROGESTERONE ACETATE 10 MG PO TABS: 10.0000 mg | ORAL_TABLET | Freq: Every day | ORAL | Status: DC

## 2011-08-04 NOTE — Progress Notes (Signed)
Pt reports no cycle since Depo lupron in January.  No other complaints.  Reports h/o nl cycles but she has been on some kind of hormonal mgmt since high school.  Filed Vitals:   08/04/11 0908  BP: 104/76  Resp: 14   ROS: noncontributory  Pelvic exam:  VULVA: normal appearing vulva with no masses, tenderness or lesions,  VAGINA: normal appearing vagina with normal color and discharge, no lesions, + discharge with odor CERVIX: normal appearing cervix without discharge or lesions,  UTERUS: uterus is normal size, shape, consistency and nontender,  ADNEXA: normal adnexa in size, nontender and no masses.  Results for orders placed in visit on 08/04/11  POCT WET PREP (WET MOUNT)      Component Value Range   Source Wet Prep POC       WBC, Wet Prep HPF POC       Bacteria Wet Prep HPF POC many     BACTERIA WET PREP MORPHOLOGY POC       Clue Cells Wet Prep HPF POC Many     CLUE CELLS WET PREP WHIFF POC Positive Whiff     Yeast Wet Prep HPF POC None     KOH Wet Prep POC       Trichomonas Wet Prep HPF POC none     pH 5.5    POCT URINE PREGNANCY      Component Value Range   Preg Test, Ur Negative      A/P Check UPT ?PCOs vs depo lupron still affecting cycle Check TSH, Prl, testosterone (free and total) Wet prep - BV -  tindamax

## 2011-08-16 ENCOUNTER — Ambulatory Visit: Payer: 59

## 2011-08-16 ENCOUNTER — Telehealth: Payer: Self-pay | Admitting: *Deleted

## 2011-08-16 NOTE — Telephone Encounter (Signed)
Attempted to call patient about missed appointment.  No answer machine to leave message.  Will let Dr Lonell Face nurse know of this.

## 2011-08-22 ENCOUNTER — Telehealth: Payer: Self-pay | Admitting: Nurse Practitioner

## 2011-08-22 NOTE — Telephone Encounter (Signed)
Spoke with patient's sister, Selena Batten.  Let her know we were trying to reach patient- but patient's phone just rings and then goes to a fast busy signal.  Selena Batten stated patient's phone was "weird and it just does that."  Requested that [employer] know that we are trying to get in touch with her.  Selena Batten stated she would text patient and let her know.  When patient calls back- need to inform patient per Dr. Dalene Carrow- pt needs to keep monthly Vit B12 appointments.  If she cannot keep appointments we would be happy to teach her to give injections to herself and prescribe B12 from pharmacy.

## 2011-08-23 ENCOUNTER — Telehealth: Payer: Self-pay | Admitting: Hematology and Oncology

## 2011-08-23 NOTE — Telephone Encounter (Signed)
Pt called to r/s 7/24 inj to 8/2 @ 8:15 am.

## 2011-08-25 ENCOUNTER — Ambulatory Visit: Payer: 59

## 2011-08-25 NOTE — Progress Notes (Signed)
Patient called @ 8am today and she is having trouble getting here today and trying to come in later.  Asked patient to call and let the injection RN know  when she could come in. At 1500 called twice to patient's home (only # on file).  Both times the phone rang approxmately  5-6 time then went to busy signal.

## 2011-08-30 ENCOUNTER — Encounter: Payer: 59 | Admitting: Obstetrics and Gynecology

## 2011-08-30 ENCOUNTER — Other Ambulatory Visit: Payer: 59

## 2011-09-13 ENCOUNTER — Ambulatory Visit: Payer: 59

## 2011-09-13 ENCOUNTER — Telehealth: Payer: Self-pay | Admitting: *Deleted

## 2011-09-13 NOTE — Telephone Encounter (Signed)
Called pt at home about missed injection appointment. Spoke to patient.  Rescheduled to Friday 09/15/2011.

## 2011-09-15 ENCOUNTER — Telehealth: Payer: Self-pay | Admitting: *Deleted

## 2011-09-15 ENCOUNTER — Ambulatory Visit: Payer: 59

## 2011-09-15 NOTE — Telephone Encounter (Signed)
Patient No Showed for injection appt today after being reschedule from missed appointment 2 days ago.  She has no showed for last 3-4 injection appointments.   Dr Dalene Carrow notified.

## 2011-09-30 ENCOUNTER — Other Ambulatory Visit: Payer: Self-pay | Admitting: Obstetrics and Gynecology

## 2011-10-03 ENCOUNTER — Ambulatory Visit (INDEPENDENT_AMBULATORY_CARE_PROVIDER_SITE_OTHER): Payer: 59

## 2011-10-03 ENCOUNTER — Other Ambulatory Visit: Payer: 59

## 2011-10-03 ENCOUNTER — Other Ambulatory Visit: Payer: Self-pay | Admitting: Obstetrics and Gynecology

## 2011-10-03 ENCOUNTER — Encounter: Payer: Self-pay | Admitting: Obstetrics and Gynecology

## 2011-10-03 ENCOUNTER — Encounter: Payer: 59 | Admitting: Obstetrics and Gynecology

## 2011-10-03 ENCOUNTER — Ambulatory Visit (INDEPENDENT_AMBULATORY_CARE_PROVIDER_SITE_OTHER): Payer: 59 | Admitting: Obstetrics and Gynecology

## 2011-10-03 VITALS — BP 108/70 | Wt 170.0 lb

## 2011-10-03 DIAGNOSIS — N838 Other noninflammatory disorders of ovary, fallopian tube and broad ligament: Secondary | ICD-10-CM

## 2011-10-03 DIAGNOSIS — D510 Vitamin B12 deficiency anemia due to intrinsic factor deficiency: Secondary | ICD-10-CM | POA: Insufficient documentation

## 2011-10-03 DIAGNOSIS — N926 Irregular menstruation, unspecified: Secondary | ICD-10-CM

## 2011-10-03 DIAGNOSIS — N898 Other specified noninflammatory disorders of vagina: Secondary | ICD-10-CM

## 2011-10-03 DIAGNOSIS — D259 Leiomyoma of uterus, unspecified: Secondary | ICD-10-CM

## 2011-10-03 DIAGNOSIS — D219 Benign neoplasm of connective and other soft tissue, unspecified: Secondary | ICD-10-CM

## 2011-10-03 DIAGNOSIS — N915 Oligomenorrhea, unspecified: Secondary | ICD-10-CM

## 2011-10-03 DIAGNOSIS — N949 Unspecified condition associated with female genital organs and menstrual cycle: Secondary | ICD-10-CM

## 2011-10-03 DIAGNOSIS — N839 Noninflammatory disorder of ovary, fallopian tube and broad ligament, unspecified: Secondary | ICD-10-CM

## 2011-10-03 LAB — POCT WET PREP (WET MOUNT)
Trichomonas Wet Prep HPF POC: NEGATIVE
pH: 5.5

## 2011-10-03 MED ORDER — TINIDAZOLE 500 MG PO TABS: 2.0000 g | ORAL_TABLET | Freq: Every day | ORAL | Status: AC

## 2011-10-03 NOTE — Progress Notes (Signed)
Ultrasound today uterus 4.2 x 4.9 x 3.9 cm endometrial thickness 4.1 cm resolution of right ovarian mass noted with a simple cystic structure in. Medial to the left ovary likely representing a parovarian cyst measuring 4.3 cm and a pedunculated fibroid measuring 6.6 cm which is stable no adnexal masses normal endometrium  C/o recurrent BV uses vaginal ph OTC insert  Filed Vitals:   10/03/11 1454  BP: 108/70    ROS: noncontributory  Pelvic exam:  VULVA: normal appearing vulva with no masses, tenderness or lesions,  VAGINA: normal appearing vagina with normal color and discharge, no lesions, CERVIX: normal appearing cervix without discharge or lesions,  UTERUS: uterus is normal size, shape, consistency and nontender,  ADNEXA: normal adnexa in size, nontender and no masses.  A/P Wet prep - BV - Rx Tindamax AEX Rec try rephresh and if no improvement try boric acid 600mg  capsules pv 2x/wk.

## 2011-10-03 NOTE — Addendum Note (Signed)
Addended by: Marla Roe A on: 10/03/2011 04:04 PM   Modules accepted: Orders

## 2011-10-03 NOTE — Addendum Note (Signed)
Addended by: Osborn Coho on: 10/03/2011 04:33 PM   Modules accepted: Orders

## 2011-10-11 ENCOUNTER — Ambulatory Visit (HOSPITAL_BASED_OUTPATIENT_CLINIC_OR_DEPARTMENT_OTHER): Payer: 59

## 2011-10-11 VITALS — BP 117/78 | HR 112 | Temp 99.4°F

## 2011-10-11 DIAGNOSIS — E538 Deficiency of other specified B group vitamins: Secondary | ICD-10-CM

## 2011-10-11 MED ORDER — CYANOCOBALAMIN 1000 MCG/ML IJ SOLN
1000.0000 ug | Freq: Once | INTRAMUSCULAR | Status: AC
  Administered 2011-10-11: 1000 ug via INTRAMUSCULAR

## 2011-11-08 ENCOUNTER — Telehealth: Payer: Self-pay | Admitting: *Deleted

## 2011-11-08 ENCOUNTER — Ambulatory Visit (HOSPITAL_BASED_OUTPATIENT_CLINIC_OR_DEPARTMENT_OTHER): Payer: 59

## 2011-11-08 VITALS — BP 109/67 | HR 96 | Temp 97.7°F

## 2011-11-08 DIAGNOSIS — E538 Deficiency of other specified B group vitamins: Secondary | ICD-10-CM

## 2011-11-08 MED ORDER — CYANOCOBALAMIN 1000 MCG/ML IJ SOLN
1000.0000 ug | Freq: Once | INTRAMUSCULAR | Status: AC
  Administered 2011-11-08: 1000 ug via INTRAMUSCULAR

## 2011-11-08 NOTE — Telephone Encounter (Signed)
Called patients home about missed appointment.  Left message for her to call back and reschedule appointment.

## 2011-11-08 NOTE — Telephone Encounter (Signed)
Teresa Brennan returned call.  Appointment rescheduled to later today.  Patient forgot appointment.

## 2011-12-06 ENCOUNTER — Other Ambulatory Visit (HOSPITAL_BASED_OUTPATIENT_CLINIC_OR_DEPARTMENT_OTHER): Payer: 59 | Admitting: Lab

## 2011-12-06 DIAGNOSIS — D539 Nutritional anemia, unspecified: Secondary | ICD-10-CM

## 2011-12-06 DIAGNOSIS — E538 Deficiency of other specified B group vitamins: Secondary | ICD-10-CM

## 2011-12-06 LAB — CBC WITH DIFFERENTIAL/PLATELET
BASO%: 1.4 % (ref 0.0–2.0)
MCHC: 34.5 g/dL (ref 31.5–36.0)
MONO#: 0.4 10*3/uL (ref 0.1–0.9)
RBC: 4.35 10*6/uL (ref 3.70–5.45)
WBC: 3.5 10*3/uL — ABNORMAL LOW (ref 3.9–10.3)
lymph#: 1.1 10*3/uL (ref 0.9–3.3)

## 2011-12-08 LAB — FERRITIN: Ferritin: 44 ng/mL (ref 10–291)

## 2011-12-08 LAB — IRON AND TIBC
%SAT: 33 % (ref 20–55)
Iron: 93 ug/dL (ref 42–145)
TIBC: 284 ug/dL (ref 250–470)
UIBC: 191 ug/dL (ref 125–400)

## 2011-12-08 LAB — VITAMIN B12: Vitamin B-12: 324 pg/mL (ref 211–911)

## 2011-12-12 ENCOUNTER — Ambulatory Visit (HOSPITAL_BASED_OUTPATIENT_CLINIC_OR_DEPARTMENT_OTHER): Payer: 59 | Admitting: Hematology and Oncology

## 2011-12-12 ENCOUNTER — Encounter: Payer: Self-pay | Admitting: Hematology and Oncology

## 2011-12-12 ENCOUNTER — Telehealth: Payer: Self-pay | Admitting: Hematology and Oncology

## 2011-12-12 VITALS — BP 105/71 | HR 97 | Temp 99.0°F | Resp 16 | Ht 67.0 in | Wt 164.7 lb

## 2011-12-12 DIAGNOSIS — D510 Vitamin B12 deficiency anemia due to intrinsic factor deficiency: Secondary | ICD-10-CM

## 2011-12-12 DIAGNOSIS — E538 Deficiency of other specified B group vitamins: Secondary | ICD-10-CM

## 2011-12-12 DIAGNOSIS — D539 Nutritional anemia, unspecified: Secondary | ICD-10-CM

## 2011-12-12 MED ORDER — POLYSACCHARIDE IRON COMPLEX 150 MG PO CAPS: 150.0000 mg | ORAL_CAPSULE | Freq: Every day | ORAL | Status: DC

## 2011-12-12 NOTE — Patient Instructions (Addendum)
Teresa Brennan  [id number]   [facility] - AFTER VISIT SUMMARY   **RECOMMENDATIONS MADE BY THE CONSULTANT AND ANY TEST    RESULTS WILL BE SENT TO YOUR REFERRING DOCTORS.   YOUR EXAM FINDINGS, LABS AND RESULTS WERE DISCUSSED BY YOUR MD TODAY.  YOU CAN GO TO THE [facility] WEB SITE FOR INSTRUCTIONS ON HOW TO ASSESS MY CHART FOR ADDITIONAL INFORMATION AS NEEDED.  Your Updated drug allergies are: Allergies as of 12/12/2011  . (No Known Allergies)    Your current list of medications are: Current Outpatient Prescriptions  Medication Sig Dispense Refill  . baclofen (LIORESAL) 10 MG tablet Take 10 mg by mouth 2 (two) times daily as needed. Muscle spasms       . chlorproMAZINE (THORAZINE) 25 MG tablet Take 25-50 mg by mouth daily as needed. For severe migraines       . HYDROcodone-acetaminophen (VICODIN) 5-500 MG per tablet Take 1 tablet by mouth every 6 (six) hours as needed. Take 1 - 2 tabs q  4 - 6 hrs PRN.      Marland Kitchen ibuprofen (ADVIL,MOTRIN) 800 MG tablet       . topiramate (TOPAMAX) 25 MG tablet Take 75 mg by mouth daily.        . vitamin B-12 (CYANOCOBALAMIN) 100 MCG tablet Take 50 mcg by mouth daily. Vit b -12 injections. JO         INSTRUCTIONS GIVEN AND DISCUSSED:  See attached schedule   SPECIAL INSTRUCTIONS/FOLLOW-UP:  See above.  I acknowledge that I have been informed and understand all the instructions given to me and received a copy.I know to contact the clinic, my physician, or go to the emergency Department if any problems should occur.   I do not have any more questions at this time, but understand that I may call the [facility] at [phone] during business hours should I have any further questions or need assistance in obtaining follow-up care.

## 2011-12-12 NOTE — Progress Notes (Signed)
This office note has been dictated.

## 2011-12-12 NOTE — Progress Notes (Signed)
CC:   Osborn Coho, M.D. [name], MD  IDENTIFYING STATEMENT:  The patient is a 26 year old woman with vitamin B12 deficiency who presents for followup.  HISTORY OF PRESENT ILLNESS:  The patient has no current concerns.  She reports that she is receiving B12 injections here at [facility].  In addition to that, she is also supplementing her diet with sublingual B12 1000 mg daily.  Her energy levels are great despite having to work full-time and go to school.  Dr. Su Hilt has her off the birth control and she has noted her periods are more sporadic.  No evidence of menorrhagia at this time.  MEDICATIONS: 1. Vitamin B12 1000 mcg q.monthly. 2. Oral sublingual vitamin B12. 3. Rest of medicines reviewed and updated.  ALLERGIES:  None.  PHYSICAL EXAM:  General:  The patient is alert and oriented x3.  Vitals: Pulse 97, blood pressure 105/31, temperature 99, respirations 16, weight 161.7 pounds.  HEENT:  Head is atraumatic, normocephalic.  Sclerae anicteric.  Mouth moist.  Chest:  Clear.  CVS:  Unremarkable.  Abdomen: Soft, nontender.  Bowel sounds present.  Extremities:  No edema.  LAB DATA:  On 12/06/2011 white cell count 3.5, hemoglobin 13, hematocrit 37.6, platelets 253.  Vitamin B12 324, iron 93, TIBC 284, saturation 33%.  Ferritin 44.  IMPRESSION AND PLAN:  Ms. Klemme is a 26 year old woman with: 1. Pernicious anemia for which she receives monthly vitamin B12     injections.  In addition, she is also taking sublingual B12 daily.     She will continue the current regimen. 2. Iron deficiency anemia.  I have asked her to begin prescription     iron in the form of NuIron daily. 3. She follows up in 4 months' time.    ______________________________ Laurice Record, M.D. LIO/MEDQ  D:  12/12/2011  T:  12/12/2011  Job:  [id number]

## 2011-12-12 NOTE — Telephone Encounter (Signed)
Gave pt appt for lab/ injection every month until March 2014 then see MD

## 2012-01-03 ENCOUNTER — Ambulatory Visit (HOSPITAL_BASED_OUTPATIENT_CLINIC_OR_DEPARTMENT_OTHER): Payer: 59

## 2012-01-03 ENCOUNTER — Other Ambulatory Visit (HOSPITAL_BASED_OUTPATIENT_CLINIC_OR_DEPARTMENT_OTHER): Payer: 59 | Admitting: Lab

## 2012-01-03 VITALS — BP 110/73 | HR 91 | Temp 97.8°F

## 2012-01-03 DIAGNOSIS — D539 Nutritional anemia, unspecified: Secondary | ICD-10-CM

## 2012-01-03 DIAGNOSIS — E538 Deficiency of other specified B group vitamins: Secondary | ICD-10-CM

## 2012-01-03 DIAGNOSIS — D510 Vitamin B12 deficiency anemia due to intrinsic factor deficiency: Secondary | ICD-10-CM

## 2012-01-03 LAB — CBC WITH DIFFERENTIAL/PLATELET
HCT: 41 % (ref 34.8–46.6)
LYMPH%: 34.6 % (ref 14.0–49.7)
MCH: 29.4 pg (ref 25.1–34.0)
MCHC: 34.1 g/dL (ref 31.5–36.0)
MONO#: 0.6 10*3/uL (ref 0.1–0.9)
MONO%: 9.4 % (ref 0.0–14.0)
NEUT%: 53.2 % (ref 38.4–76.8)
RBC: 4.76 10*6/uL (ref 3.70–5.45)
RDW: 12.8 % (ref 11.2–14.5)
WBC: 6 10*3/uL (ref 3.9–10.3)
lymph#: 2.1 10*3/uL (ref 0.9–3.3)

## 2012-01-03 LAB — BASIC METABOLIC PANEL (CC13)
BUN: 14 mg/dL (ref 7.0–26.0)
CO2: 26 mEq/L (ref 22–29)
Calcium: 9.5 mg/dL (ref 8.4–10.4)
Glucose: 85 mg/dl (ref 70–99)
Sodium: 141 mEq/L (ref 136–145)

## 2012-01-03 MED ORDER — CYANOCOBALAMIN 1000 MCG/ML IJ SOLN
1000.0000 ug | Freq: Once | INTRAMUSCULAR | Status: AC
  Administered 2012-01-03: 1000 ug via INTRAMUSCULAR

## 2012-01-30 ENCOUNTER — Other Ambulatory Visit: Payer: Self-pay | Admitting: Family

## 2012-01-30 DIAGNOSIS — D649 Anemia, unspecified: Secondary | ICD-10-CM

## 2012-01-30 DIAGNOSIS — E538 Deficiency of other specified B group vitamins: Secondary | ICD-10-CM

## 2012-01-30 DIAGNOSIS — D510 Vitamin B12 deficiency anemia due to intrinsic factor deficiency: Secondary | ICD-10-CM

## 2012-01-31 ENCOUNTER — Encounter: Payer: Self-pay | Admitting: Hematology and Oncology

## 2012-01-31 ENCOUNTER — Other Ambulatory Visit: Payer: 59 | Admitting: Lab

## 2012-01-31 ENCOUNTER — Telehealth: Payer: Self-pay

## 2012-01-31 ENCOUNTER — Ambulatory Visit: Payer: 59

## 2012-01-31 NOTE — Progress Notes (Signed)
Per Tiffany- Nurse, patient had cancelled because she no longer had insurance. I called and spoke with the patient and she is no longer employed. She has applied for unemployment but had heard nothing back. She gets help from her aunt right now. She has not applied for Medicaid. I advised her of our financial assistance, but she has to file for medicaid also. I advised her to come by here and pick up the forms to get filled out, so she can get her injections. She said she would. She would qualified for 100% indigent with letter of support until possible medicaid.

## 2012-01-31 NOTE — Telephone Encounter (Signed)
Called and spoke with pt re: FTKA today.  Pt states she informed the nurse last time that she would not have insurance coming up.  Informed pt will speak with financial counselors, and office will call her back.  Raquel, Artist,  will call and speak with pt.

## 2012-02-24 ENCOUNTER — Telehealth: Payer: Self-pay | Admitting: Hematology and Oncology

## 2012-03-06 ENCOUNTER — Ambulatory Visit: Payer: 59

## 2012-03-06 ENCOUNTER — Other Ambulatory Visit: Payer: 59 | Admitting: Lab

## 2012-04-03 ENCOUNTER — Other Ambulatory Visit: Payer: 59 | Admitting: Lab

## 2012-04-03 ENCOUNTER — Ambulatory Visit: Payer: 59

## 2012-04-05 ENCOUNTER — Other Ambulatory Visit: Payer: 59

## 2012-04-09 ENCOUNTER — Ambulatory Visit: Payer: 59 | Admitting: Hematology and Oncology

## 2012-09-27 ENCOUNTER — Telehealth: Payer: Self-pay | Admitting: Internal Medicine

## 2012-09-27 NOTE — Telephone Encounter (Signed)
Returned pt's call re getting back on schedule for lb/fu. Per pt she stopped coming last year due to no ins. Former LO pt scheduled for 9/24 lb/CP @ 2:30pm.

## 2012-10-16 ENCOUNTER — Other Ambulatory Visit: Payer: Self-pay | Admitting: Internal Medicine

## 2012-10-16 ENCOUNTER — Telehealth: Payer: Self-pay | Admitting: Internal Medicine

## 2012-10-16 ENCOUNTER — Encounter: Payer: Self-pay | Admitting: Internal Medicine

## 2012-10-16 ENCOUNTER — Other Ambulatory Visit (HOSPITAL_BASED_OUTPATIENT_CLINIC_OR_DEPARTMENT_OTHER): Payer: 59

## 2012-10-16 ENCOUNTER — Ambulatory Visit (HOSPITAL_BASED_OUTPATIENT_CLINIC_OR_DEPARTMENT_OTHER): Payer: 59 | Admitting: Internal Medicine

## 2012-10-16 VITALS — BP 95/71 | HR 76 | Temp 98.8°F | Resp 18 | Ht 67.0 in | Wt 148.8 lb

## 2012-10-16 DIAGNOSIS — D509 Iron deficiency anemia, unspecified: Secondary | ICD-10-CM

## 2012-10-16 DIAGNOSIS — D649 Anemia, unspecified: Secondary | ICD-10-CM

## 2012-10-16 DIAGNOSIS — E538 Deficiency of other specified B group vitamins: Secondary | ICD-10-CM

## 2012-10-16 DIAGNOSIS — D510 Vitamin B12 deficiency anemia due to intrinsic factor deficiency: Secondary | ICD-10-CM

## 2012-10-16 LAB — COMPREHENSIVE METABOLIC PANEL (CC13)
ALT: 10 U/L (ref 0–55)
AST: 18 U/L (ref 5–34)
Alkaline Phosphatase: 72 U/L (ref 40–150)
BUN: 6.5 mg/dL — ABNORMAL LOW (ref 7.0–26.0)
CO2: 23 mEq/L (ref 22–29)
Calcium: 9.1 mg/dL (ref 8.4–10.4)
Chloride: 108 mEq/L (ref 98–109)
Creatinine: 0.9 mg/dL (ref 0.6–1.1)
Glucose: 83 mg/dl (ref 70–140)

## 2012-10-16 LAB — CBC WITH DIFFERENTIAL/PLATELET
BASO%: 1.5 % (ref 0.0–2.0)
Basophils Absolute: 0.1 10*3/uL (ref 0.0–0.1)
EOS%: 1.8 % (ref 0.0–7.0)
HCT: 38.4 % (ref 34.8–46.6)
HGB: 12.6 g/dL (ref 11.6–15.9)
MCHC: 32.8 g/dL (ref 31.5–36.0)
MCV: 83.7 fL (ref 79.5–101.0)
MONO#: 0.5 10*3/uL (ref 0.1–0.9)
MONO%: 9.4 % (ref 0.0–14.0)
RBC: 4.59 10*6/uL (ref 3.70–5.45)
RDW: 12.5 % (ref 11.2–14.5)
WBC: 5.7 10*3/uL (ref 3.9–10.3)
lymph#: 2.8 10*3/uL (ref 0.9–3.3)

## 2012-10-16 LAB — IRON AND TIBC CHCC
Iron: 35 ug/dL — ABNORMAL LOW (ref 41–142)
TIBC: 374 ug/dL (ref 236–444)
UIBC: 339 ug/dL (ref 120–384)

## 2012-10-16 LAB — FERRITIN CHCC: Ferritin: 15 ng/ml (ref 9–269)

## 2012-10-16 LAB — FOLATE: Folate: 14.5 ng/mL

## 2012-10-16 MED ORDER — CYANOCOBALAMIN 1000 MCG/ML IJ SOLN
1000.0000 ug | Freq: Once | INTRAMUSCULAR | Status: AC
  Administered 2012-10-16: 1000 ug via INTRAMUSCULAR

## 2012-10-16 MED ORDER — CYANOCOBALAMIN 1000 MCG/ML IJ SOLN
INTRAMUSCULAR | Status: AC
  Filled 2012-10-16: qty 1

## 2012-10-16 NOTE — Telephone Encounter (Signed)
gv adn printeda ppt sched and avs fo rpt for Jan 2015...Marland Kitchenpt and I s/w robyn to see if mthly inj can be precribed for home. Robyn to advised pt after speaking with Dr. Rosie Fate

## 2012-10-16 NOTE — Telephone Encounter (Signed)
I called the patient to inform her of her low ferritin of 15 and iron level is low as well.  I recommended that she take ferrous sulfate or iron supplementation twice daily with orange juice.  We will recheck the labs in about one month.

## 2012-10-16 NOTE — Patient Instructions (Signed)
Iron Deficiency Anemia There are many types of anemia. Iron deficiency anemia is the most common. Iron deficiency anemia is a decrease in the number of red blood cells caused by too little iron. Without enough iron, your body does not produce enough hemoglobin. Hemoglobin is a substance in red blood cells that carries oxygen to the body's tissues. Iron deficiency anemia may leave you tired and short of breath. CAUSES   Lack of iron in the diet.  This may be seen in infants and children, because there is little iron in milk.  This may be seen in adults who do not eat enough iron-rich foods.  This may be seen in pregnant or breastfeeding women who do not take iron supplements. There is a much higher need for iron intake at these times.  Poor absorption of iron, as seen with intestinal disorders.  Intestinal bleeding.  Heavy periods. SYMPTOMS  Mild anemia may not be noticeable. Symptoms may include:  Fatigue.  Headache.  Pale skin.  Weakness.  Shortness of breath.  Dizziness.  Cold hands and feet.  Fast or irregular heartbeat. DIAGNOSIS  Diagnosis requires a thorough evaluation and physical exam by your caregiver.  Blood tests are generally used to confirm iron deficiency anemia.  Additional tests may be done to find the underlying cause of your anemia. These may include:  Testing for blood in the stool (fecal occult blood test).  A procedure to see inside the colon and rectum (colonoscopy).  A procedure to see inside the esophagus and stomach (endoscopy). TREATMENT   Correcting the cause of the iron deficiency is the first step.  Medicines, such as oral contraceptives, can make heavy menstrual flows lighter.  Antibiotics and other medicines can be used to treat peptic ulcers.  Surgery may be needed to remove a bleeding polyp, tumor, or fibroid.  Often, iron supplements (ferrous sulfate) are taken.  For the best iron absorption, take these supplements with an  empty stomach.  You may need to take the supplements with food if you cannot tolerate them on an empty stomach. Vitamin C improves the absorption of iron. Your caregiver may recommend taking your iron tablets with a glass of orange juice or vitamin C supplement.  Milk and antacids should not be taken at the same time as iron supplements. They may interfere with the absorption of iron.  Iron supplements can cause constipation. A stool softener is often recommended.  Pregnant and breastfeeding women will need to take extra iron, because their normal diet usually will not provide the required amount.  Patients who cannot tolerate iron by mouth can take it through a vein (intravenously) or by an injection into the muscle. HOME CARE INSTRUCTIONS   Ask your dietitian for help with diet questions.  Take iron and vitamins as directed by your caregiver.  Eat a diet rich in iron. Eat liver, lean beef, whole-grain bread, eggs, dried fruit, and dark green leafy vegetables. SEEK IMMEDIATE MEDICAL CARE IF:   You have a fainting episode. Do not drive yourself. Call your local emergency services (911 in [place] if no other help is available.  You have chest pain, nausea, or vomiting.  You develop severe or increased shortness of breath with activities.  You develop weakness or increased thirst.  You have a rapid heartbeat.  You develop unexplained sweating or become lightheaded when getting up from a chair or bed. MAKE SURE YOU:   Understand these instructions.  Will watch your condition.  Will get help right away   if you are not doing well or get worse. Document Released: 01/07/2000 Document Revised: 04/03/2011 Document Reviewed: 05/18/2009 [facility] Patient Information 2014 [place], [place]. Pernicious Anemia Pernicious anemia may be an immune system illness. It causes the production of antibodies to cells of the stomach (parietal cells), and proteins produced by the stomach, which are needed  to absorb vitamin B12. The result of this illness is the body does not absorb enough B12 from the diet. This leads to lessened red blood cell production which causes anemia. Vitamin B12 is needed for making red blood cells and keeping the nervous system healthy. Not enough vitamin B12 in your system slowly affects sensory and motor nerves. This causes problems with your nervous system (neurological) to develop over time. Neurological effects of vitamin B12 deficiency may be seen before anemia is diagnosed. This affects both men and women, between ages 72 and 67. The anemia also affects the bowel, the heart and vascular systems and cannot be prevented. CAUSES  Pernicious anemia is due to a lack of substance called intrinsic factor. This is a substance made by cells in the stomach. It makes it possible to absorb vitamin B12. The reason for the lack of this substance is unknown but it may be autoimmune, genetic, or both. SYMPTOMS  The following problems may be seen with this illness:  Problems develop slowly.  Rapid heart rate.  Nausea, appetite loss, and weight loss.  Difficulty maintaining proper balance.  Yellow eyes and skin.  Loss of deep tendon reflexes.  Depression.  Confusion, poor memory, and dementia.  Ringing in the ears (tinnitus).  Weakness, especially in the arms and legs.  Sore tongue.  Numbness or tingling in the hands and feet.  Pale lips, tongue, and gums.  Bleeding gums.  Shortness of breath.  Headache.  Fatigue. RISK OF VITAMIN B12 DEFICIENCY INCREASES WITH:  Diseases or surgery affecting the stomach.  Diabetes and autoimmune disorders. Autoimmune disorders are diseases where the body makes antibodies which attack your own body tissues.  Thyroid disorders.  Genetic factors, such as in people of Northern European ancestry. It is rare in African Americans and Asians.  Family history of pernicious anemia.  Age over 38.  Strict vegetarian diet or  infants breast-fed by a mother on a strict vegetarian diet.  Lack of stomach acid in older adults.  Parasitic infections and intestinal diseases.  Drugs such as H2 blockers, proton pump inhibitors, colchicine, neomycin, and aminosalicylic acid.  Alcoholism. PREVENTION  Pernicious anemia cannot be prevented but vitamin B12 deficiencies can be prevented.  For pernicious anemia, lifelong vitamin B12 therapy will help symptoms and prevent complications.  Dietary changes can prevent deficiency. B12 is mostly from animal sources so a deficiency is more likely in a vegetarian who does not eat eggs or dairy products. RELATED COMPLICATIONS  Heart failure.  Nerve damage that can not be reversed.  Gastric cancer. DIAGNOSIS  Your caregiver can determine what is wrong by:  Doing blood tests for vitamin B12 levels.  Checking for antibodies to the intrinsic factor.  Measuring the body's ability to absorb vitamin B12. TREATMENT   Life long treatment usually involves vitamin B12 replacement. Monthly vitamin B12 injections are the treatment of choice to correct vitamin B12 deficiency and may be given by the patient. This therapy corrects the anemia and it may correct the neurological complications if given early enough. About 1% of vitamin B12 is absorbed (even in the absence of intrinsic factor) so some caregivers recommend that elderly patients with  gastric atrophy take oral vitamin B12 supplements in addition to monthly injections.  Some symptoms should start to clear up in a few days after treatment begins but other symptoms may take several months.  Additionally, other conditions which may lead to a deficiency should be treated.  Stop drinking alcohol if alcoholism led to the vitamin B12 deficiency.  For other patients, the vitamin may be taken by mouth or as a nasal gel (or in addition to injections).  Iron supplements may be prescribed.  Avoid taking high amounts of folic acid. It  can mask the signs of vitamin B12 deficiency.  Activity may be limited until symptoms improve.  Eat a well-balanced diet.  People on strict vegetarian diets can change their diet or take vitamin B12 supplements for life. PROGNOSIS   When caught early, the prognosis is good. Most people will do well.  Patients with this illness have a higher incidence of cancer and polyps of the stomach.  Nervous system problems may not improve if treatment does not start soon enough. Document Released: 04/01/2003 Document Revised: 04/03/2011 Document Reviewed: 01/07/2008 [facility] Patient Information 2014 [place], [place].

## 2012-10-16 NOTE — Progress Notes (Signed)
[facility] OFFICE PROGRESS NOTE  Purcell Nails, MD [address] [place] [address]  DIAGNOSIS: Anemia - Plan: CBC with Differential, Comprehensive metabolic panel, Iron and TIBC, Ferritin  Vitamin B12 deficiency - Plan: CBC with Differential, Comprehensive metabolic panel, Iron and TIBC, Ferritin, SCHEDULING COMMUNICATION INJECTION, cyanocobalamin ((VITAMIN B-12)) injection 1,000 mcg  Pernicious anemia - Plan: CBC with Differential, Comprehensive metabolic panel, Iron and TIBC, Ferritin  CC: Anemia  CURRENT THERAPY: Monthly vitamin B12 shots; Ferrous sulfate 325 mg bid.   INTERVAL HISTORY: Teresa Brennan 27 y.o. female with a history of pernicious anemia and vitamin B12 deficiency who have been lost-to-follow up because of her change in her insurance.  She has gotten a new job that provides coverage and made a follow-up appointment.  Her last vitamin B12 shot was in November of 2013.  She report normal menses (5 tampons/ day for about 5-6 days).  She reports feeling tire more than usual.  She endorses occasional headaches as well.  She denies pica symptoms.  She also denies recent hospitalizations or emergency room visits.    MEDICAL HISTORY: Past Medical History  Diagnosis Date  . Ovarian tumor   . Headache(784.0)   . Anemia   . History of fatigue 03/10/11  . Fibroid   . Irregular menses   . Migraine   . Painful intercourse 11/15/10  . Dyspareunia, female     INTERIM HISTORY: has Vitamin B12 deficiency; Anemia; and Pernicious anemia on her problem list.    ALLERGIES:  has No Known Allergies.  MEDICATIONS: has a current medication list which includes the following prescription(s): multivitamin and cyanocobalamin.  SURGICAL HISTORY:  Past Surgical History  Procedure Laterality Date  . Hysteroscopy      REVIEW OF SYSTEMS:   Constitutional: Denies fevers, chills or abnormal weight loss Eyes: Denies blurriness of vision Ears, nose, mouth,  throat, and face: Denies mucositis or sore throat Respiratory: Denies cough, dyspnea or wheezes Cardiovascular: Denies palpitation, chest discomfort or lower extremity swelling Gastrointestinal:  Denies nausea, heartburn or change in bowel habits Skin: Denies abnormal skin rashes Lymphatics: Denies new lymphadenopathy or easy bruising Neurological:Denies numbness, tingling or new weaknesses Behavioral/Psych: Mood is stable, no new changes  All other systems were reviewed with the patient and are negative.  PHYSICAL EXAMINATION: ECOG PERFORMANCE STATUS: 0 - Asymptomatic  Blood pressure 95/71, pulse 76, temperature 98.8 F (37.1 C), temperature source Oral, resp. rate 18, height 5\' 7"  (1.702 m), weight 148 lb 12.8 oz (67.495 kg).  GENERAL:alert, no distress and comfortable SKIN: skin color, texture, turgor are normal, no rashes or significant lesions EYES: normal, Conjunctiva are pink and non-injected, sclera clear OROPHARYNX:no exudate, no erythema and lips, buccal mucosa, and tongue normal  NECK: supple, thyroid normal size, non-tender, without nodularity LYMPH:  no palpable lymphadenopathy in the cervical or supraclavicular LUNGS: clear to auscultation and percussion with normal breathing effort HEART: regular rate & rhythm and no murmurs and no lower extremity edema ABDOMEN:abdomen soft, non-tender and normal bowel sounds Musculoskeletal:no cyanosis of digits and no clubbing  NEURO: alert & oriented x 3 with fluent speech, no focal motor/sensory deficits  LABORATORY DATA: Results for orders placed in visit on 10/16/12 (from the past 48 hour(s))  CBC WITH DIFFERENTIAL     Status: Abnormal   Collection Time    10/16/12  2:55 PM      Result Value Range   WBC 5.7  3.9 - 10.3 10e3/uL   NEUT# 2.1  1.5 -  6.5 10e3/uL   HGB 12.6  11.6 - 15.9 g/dL   HCT 16.1  09.6 - 04.5 %   Platelets 282  145 - 400 10e3/uL   MCV 83.7  79.5 - 101.0 fL   MCH 27.5  25.1 - 34.0 pg   MCHC 32.8  31.5 -  36.0 g/dL   RBC 4.09  8.11 - 9.14 10e6/uL   RDW 12.5  11.2 - 14.5 %   lymph# 2.8  0.9 - 3.3 10e3/uL   MONO# 0.5  0.1 - 0.9 10e3/uL   Eosinophils Absolute 0.1  0.0 - 0.5 10e3/uL   Basophils Absolute 0.1  0.0 - 0.1 10e3/uL   NEUT% 37.4 (*) 38.4 - 76.8 %   LYMPH% 49.9 (*) 14.0 - 49.7 %   MONO% 9.4  0.0 - 14.0 %   EOS% 1.8  0.0 - 7.0 %   BASO% 1.5  0.0 - 2.0 %  FERRITIN [facility]     Status: None   Collection Time    10/16/12  2:55 PM      Result Value Range   Ferritin 15  9 - 269 ng/ml  IRON AND TIBC [facility]     Status: Abnormal   Collection Time    10/16/12  2:55 PM      Result Value Range   Iron 35 (*) 41 - 142 ug/dL   TIBC 782  956 - 213 ug/dL   UIBC 086  578 - 469 ug/dL   %SAT 9 (*) 21 - 57 %  COMPREHENSIVE METABOLIC PANEL (CC13)     Status: Abnormal   Collection Time    10/16/12  2:56 PM      Result Value Range   Sodium 140  136 - 145 mEq/L   Potassium 3.8  3.5 - 5.1 mEq/L   Chloride 108  98 - 109 mEq/L   CO2 23  22 - 29 mEq/L   Glucose 83  70 - 140 mg/dl   BUN 6.5 (*) 7.0 - 62.9 mg/dL   Creatinine 0.9  0.6 - 1.1 mg/dL   Total Bilirubin 5.28  0.20 - 1.20 mg/dL   Alkaline Phosphatase 72  40 - 150 U/L   AST 18  5 - 34 U/L   ALT 10  0 - 55 U/L   Total Protein 7.3  6.4 - 8.3 g/dL   Albumin 3.6  3.5 - 5.0 g/dL   Calcium 9.1  8.4 - 41.3 mg/dL  VITAMIN K44     Status: None   Collection Time    10/16/12  2:57 PM      Result Value Range   Vitamin B-12 486  211 - 911 pg/mL  FOLATE     Status: None   Collection Time    10/16/12  2:57 PM      Result Value Range   Folate 14.5     Comment:  Reference Ranges        Deficient:       0.4 - 3.3 ng/mL        Indeterminate:   3.4 - 5.4 ng/mL        Normal:              > 5.4 ng/mL     Iron/TIBC/Ferritin    Component Value Date/Time   IRON 35* 10/16/2012 1455   IRON 93 12/06/2011 0843   TIBC 374 10/16/2012 1455   TIBC 284 12/06/2011 0843   FERRITIN 15 10/16/2012 1455   FERRITIN 44 12/06/2011 0843  RADIOGRAPHIC STUDIES: No  results found.  ASSESSMENT: Pernicious anemia; Iron deficiency anemia.   PLAN:  1. Pernicious anemia for which she receives monthly vitamin B12 injections. She will receive a shot today and then monthly.She will continue the current regimen.  2. Iron deficiency anemia. She will resume  iron in the form of NuIron twice daily.  3. She follows up in 4 months' time.  All questions were answered. The patient knows to call the clinic with any problems, questions or concerns. We can certainly see the patient much sooner if necessary.  I spent 15 minutes counseling the patient face to face. The total time spent in the appointment was 30 minutes.    Shabazz Mckey, MD 10/17/2012 2:00 PM

## 2012-10-17 ENCOUNTER — Other Ambulatory Visit: Payer: Self-pay | Admitting: Medical Oncology

## 2012-10-17 ENCOUNTER — Encounter: Payer: Self-pay | Admitting: Medical Oncology

## 2012-10-17 MED ORDER — CYANOCOBALAMIN 1000 MCG/ML IJ SOLN: 1000.0000 ug | INTRAMUSCULAR | Status: DC

## 2012-10-17 NOTE — Telephone Encounter (Signed)
I called pt to inform her that  Dr.Chism is ok with her doing the Vit B 12 injections at home. ( pt had asked after her visit yesterday if she could do herself) Prescription was e-scribed to

## 2012-10-17 NOTE — Progress Notes (Signed)
I called pt to let her know that I e-scribed her Vit B 12 to [facility]. I asked her to call me if there is any issue and she can not afford it, we can schedule her appts to come in. She voiced understanding.

## 2012-11-21 LAB — OB RESULTS CONSOLE ANTIBODY SCREEN: Antibody Screen: NEGATIVE

## 2012-11-21 LAB — OB RESULTS CONSOLE ABO/RH: RH Type: POSITIVE

## 2012-11-21 LAB — OB RESULTS CONSOLE GC/CHLAMYDIA
CHLAMYDIA, DNA PROBE: NEGATIVE
GC PROBE AMP, GENITAL: NEGATIVE

## 2012-11-21 LAB — OB RESULTS CONSOLE HIV ANTIBODY (ROUTINE TESTING): HIV: NONREACTIVE

## 2012-11-21 LAB — OB RESULTS CONSOLE HEPATITIS B SURFACE ANTIGEN: HEP B S AG: NEGATIVE

## 2012-11-21 LAB — OB RESULTS CONSOLE RUBELLA ANTIBODY, IGM: RUBELLA: IMMUNE

## 2012-11-21 LAB — OB RESULTS CONSOLE RPR: RPR: NONREACTIVE

## 2013-02-05 ENCOUNTER — Other Ambulatory Visit: Payer: 59

## 2013-02-05 ENCOUNTER — Other Ambulatory Visit: Payer: Self-pay

## 2013-02-05 ENCOUNTER — Ambulatory Visit: Payer: 59

## 2013-02-07 ENCOUNTER — Telehealth: Payer: Self-pay | Admitting: Internal Medicine

## 2013-02-07 ENCOUNTER — Ambulatory Visit (INDEPENDENT_AMBULATORY_CARE_PROVIDER_SITE_OTHER): Payer: 59 | Admitting: Family Medicine

## 2013-02-07 VITALS — BP 108/70 | HR 93 | Temp 98.8°F | Resp 17 | Ht 67.5 in | Wt 148.0 lb

## 2013-02-07 DIAGNOSIS — Z3400 Encounter for supervision of normal first pregnancy, unspecified trimester: Secondary | ICD-10-CM

## 2013-02-07 DIAGNOSIS — B9689 Other specified bacterial agents as the cause of diseases classified elsewhere: Secondary | ICD-10-CM

## 2013-02-07 DIAGNOSIS — R1032 Left lower quadrant pain: Secondary | ICD-10-CM

## 2013-02-07 DIAGNOSIS — N760 Acute vaginitis: Secondary | ICD-10-CM

## 2013-02-07 LAB — POCT URINALYSIS DIPSTICK
BILIRUBIN UA: NEGATIVE
Glucose, UA: NEGATIVE
Ketones, UA: NEGATIVE
LEUKOCYTES UA: NEGATIVE
Nitrite, UA: NEGATIVE
PH UA: 7.5
PROTEIN UA: NEGATIVE
Spec Grav, UA: 1.015
Urobilinogen, UA: 0.2

## 2013-02-07 LAB — POCT WET PREP WITH KOH
Clue Cells Wet Prep HPF POC: 75
KOH Prep POC: NEGATIVE
RBC Wet Prep HPF POC: NEGATIVE
TRICHOMONAS UA: NEGATIVE
Yeast Wet Prep HPF POC: NEGATIVE

## 2013-02-07 LAB — POCT UA - MICROSCOPIC ONLY
Casts, Ur, LPF, POC: NEGATIVE
Crystals, Ur, HPF, POC: NEGATIVE
Mucus, UA: NEGATIVE
WBC, UR, HPF, POC: NEGATIVE
Yeast, UA: NEGATIVE

## 2013-02-07 LAB — POCT CBC
GRANULOCYTE PERCENT: 82.9 % — AB (ref 37–80)
HCT, POC: 38.5 % (ref 37.7–47.9)
Hemoglobin: 12.3 g/dL (ref 12.2–16.2)
Lymph, poc: 1.5 (ref 0.6–3.4)
MCH, POC: 27.8 pg (ref 27–31.2)
MCHC: 31.9 g/dL (ref 31.8–35.4)
MCV: 86.8 fL (ref 80–97)
MID (CBC): 0.4 (ref 0–0.9)
MPV: 8.4 fL (ref 0–99.8)
PLATELET COUNT, POC: 291 10*3/uL (ref 142–424)
POC GRANULOCYTE: 9.6 — AB (ref 2–6.9)
POC LYMPH %: 13.3 % (ref 10–50)
POC MID %: 3.8 %M (ref 0–12)
RBC: 4.43 M/uL (ref 4.04–5.48)
RDW, POC: 13.3 %
WBC: 11.6 10*3/uL — AB (ref 4.6–10.2)

## 2013-02-07 MED ORDER — METRONIDAZOLE 500 MG PO TABS: 500.0000 mg | ORAL_TABLET | Freq: Two times a day (BID) | ORAL | Status: DC

## 2013-02-07 NOTE — Patient Instructions (Addendum)
It appears that you are having round ligament pain- this is pain caused by stretching of the ligament that holds your uterus in place.  Use the flagyl twice a day for 7 days for let me know if you are not feeling better soon!

## 2013-02-07 NOTE — Progress Notes (Addendum)
[facility] [address], [place] [address] [phone]  Date:  02/07/2013   Name:  Teresa Brennan   DOB:  [birth year]   MRN:  [id number]  PCP:  Delice Lesch, MD    Chief Complaint: Abdominal Pain   History of Present Illness:  Teresa Brennan is a 28 y.o. very pleasant female patient who presents with the following:  She is here today with LLQ pain for the last few hours. She is pregnant and is due June 18th- all is going well so far with her pregnancy.  She is 18 +1 today.  G1P0.  She noted onset of the pain about 3 hours prior to her evaluation.  For the first hour it was quite severe but is now nearly gone.  No bleeding.  No loss of fluids.  She has not yet felt her baby move She has had some nausea but is taking diclegis for this.  No vomiting with this medication She is generally healthy She has had one ultrasound so far during her pregnancy.  She has noted a vague strange feeling of possible discomfort during urination but otherwise no dysuria, no hematuria No fever or chills No vaginal discharge.   She is a pt at [facility]  Patient Active Problem List   Diagnosis Date Noted  . Pernicious anemia 10/03/2011  . Vitamin B12 deficiency 03/10/2011  . Anemia 03/10/2011    Past Medical History  Diagnosis Date  . Ovarian tumor   . Headache(784.0)   . Anemia   . History of fatigue 03/10/11  . Fibroid   . Irregular menses   . Migraine   . Painful intercourse 11/15/10  . Dyspareunia, female     Past Surgical History  Procedure Laterality Date  . Hysteroscopy      History  Substance Use Topics  . Smoking status: Never Smoker   . Smokeless tobacco: Not on file  . Alcohol Use: No    No family history on file.  No Known Allergies  Medication list has been reviewed and updated.  Current Outpatient Prescriptions on File Prior to Visit  Medication Sig Dispense Refill  . cyanocobalamin (,VITAMIN B-12,) 1000 MCG/ML injection  Inject 1 mL (1,000 mcg total) into the muscle every 30 (thirty) days.  10 mL  1  . Multiple Vitamin (MULTIVITAMIN) tablet Take 1 tablet by mouth daily.       No current facility-administered medications on file prior to visit.    Review of Systems:  As per HPI- otherwise negative.   Physical Examination: Filed Vitals:   02/07/13 1429  BP: 126/78  Pulse: 93  Temp: 98.8 F (37.1 C)  Resp: 17   Filed Vitals:   02/07/13 1429  Height: 5' 7.5" (1.715 m)  Weight: 148 lb (67.132 kg)   Body mass index is 22.82 kg/(m^2). Ideal Body Weight: Weight in (lb) to have BMI = 25: 161.7  GEN: WDWN, NAD, Non-toxic, A & O x 3, looks well.  Here with a friend today HEENT: Atraumatic, Normocephalic. Neck supple. No masses, No LAD.  Bilateral TM wnl, oropharynx normal.  PEERL,EOMI.   Ears and Nose: No external deformity. CV: RRR, No M/G/R. No JVD. No thrill. No extra heart sounds. PULM: CTA B, no wheezes, crackles, rhonchi. No retractions. No resp. distress. No accessory muscle use. ABD: S, ND, +BS. No rebound. No HSM. Gravid adomen with fundus just under umbilicus.  Minimal tenderness in left adnexal region EXTR: No c/c/e NEURO Normal  gait.  PSYCH: Normally interactive. Conversant. Not depressed or anxious appearing.  Calm demeanor.  Gu: cervix is visually closed.  No CMT.  Some white discharge in vaginal vault.  FHT auscultated with doppler at 130- 140 BPM   Assessment and Plan: Supervision of normal first pregnancy - Plan: POCT UA - Microscopic Only  LLQ pain - Plan: POCT urinalysis dipstick, POCT CBC, POCT Wet Prep with KOH, GC/Chlamydia Probe Amp, Urine culture  BV (bacterial vaginosis) - Plan: metroNIDAZOLE (FLAGYL) 500 MG tablet  Spoke with Donnel Saxon, the NM on call for [facility] Ok to treat for BV with flagyl 500 BID for 7 days.  Otherwise at this time exam appears reassuring.  Suspect round ligament pain.   Precautions regarding when to seek care again: if any  recurrent severe pain or if she has any other worrisome sx she can call us, call her OB office or to to [place], [place]  Added amoxicillin for enterococcus UTI on 1/18.  She is aware.    Results for orders placed in visit on 02/07/13  GC/CHLAMYDIA PROBE AMP      Result Value Range   CT Probe RNA NEGATIVE     GC Probe RNA NEGATIVE    URINE CULTURE      Result Value Range   Culture ENTEROCOCCUS SPECIES     Colony Count >=100,000 COLONIES/ML     Organism ID, Bacteria ENTEROCOCCUS SPECIES    POCT URINALYSIS DIPSTICK      Result Value Range   Color, UA yellow     Clarity, UA clear     Glucose, UA neg     Bilirubin, UA neg     Ketones, UA neg     Spec Grav, UA 1.015     Blood, UA trace-lysed     pH, UA 7.5     Protein, UA neg     Urobilinogen, UA 0.2     Nitrite, UA neg     Leukocytes, UA Negative    POCT CBC      Result Value Range   WBC 11.6 (*) 4.6 - 10.2 K/uL   Lymph, poc 1.5  0.6 - 3.4   POC LYMPH PERCENT 13.3  10 - 50 %L   MID (cbc) 0.4  0 - 0.9   POC MID % 3.8  0 - 12 %M   POC Granulocyte 9.6 (*) 2 - 6.9   Granulocyte percent 82.9 (*) 37 - 80 %G   RBC 4.43  4.04 - 5.48 M/uL   Hemoglobin 12.3  12.2 - 16.2 g/dL   HCT, POC 38.5  37.7 - 47.9 %   MCV 86.8  80 - 97 fL   MCH, POC 27.8  27 - 31.2 pg   MCHC 31.9  31.8 - 35.4 g/dL   RDW, POC 13.3     Platelet Count, POC 291  142 - 424 K/uL   MPV 8.4  0 - 99.8 fL  POCT UA - MICROSCOPIC ONLY      Result Value Range   WBC, Ur, HPF, POC neg     RBC, urine, microscopic 1-3     Bacteria, U Microscopic trace     Mucus, UA neg     Epithelial cells, urine per micros 1-3     Crystals, Ur, HPF, POC neg     Casts, Ur, LPF, POC neg     Yeast, UA neg    POCT WET PREP WITH KOH  Result Value Range   Trichomonas, UA Negative     Clue Cells Wet Prep HPF POC 75%     Epithelial Wet Prep HPF POC TNTC     Yeast Wet Prep HPF POC negative     Bacteria Wet Prep HPF POC moderate     RBC Wet Prep HPF POC negative      WBC Wet Prep HPF POC 0-3     KOH Prep POC Negative

## 2013-02-07 NOTE — Telephone Encounter (Signed)
, °

## 2013-02-08 LAB — GC/CHLAMYDIA PROBE AMP
CT Probe RNA: NEGATIVE
GC Probe RNA: NEGATIVE

## 2013-02-09 ENCOUNTER — Telehealth: Payer: Self-pay | Admitting: Family Medicine

## 2013-02-09 DIAGNOSIS — N390 Urinary tract infection, site not specified: Principal | ICD-10-CM

## 2013-02-09 DIAGNOSIS — B952 Enterococcus as the cause of diseases classified elsewhere: Secondary | ICD-10-CM

## 2013-02-09 MED ORDER — AMOXICILLIN 875 MG PO TABS: 875.0000 mg | ORAL_TABLET | Freq: Two times a day (BID) | ORAL | Status: DC

## 2013-02-09 NOTE — Telephone Encounter (Signed)
Called and LMOM.  Her preliminary urine culture does show a UTI.  By tomorrow we should have the sensitivities back.  I will call her in the am and plan to call in abx for her.  Called her back and was able to reach her.  Went ahead and called in amox which should be an effective and also safe choice for this infection.  However she is feeling quite well and prefers to wait until the am to pick up her medication; hopefully sen will be back then as well.  Plan to call her in the morning.

## 2013-02-10 ENCOUNTER — Encounter: Payer: Self-pay | Admitting: Family Medicine

## 2013-02-10 LAB — URINE CULTURE

## 2013-02-13 ENCOUNTER — Ambulatory Visit: Payer: 59

## 2013-02-13 ENCOUNTER — Other Ambulatory Visit: Payer: 59

## 2013-02-14 ENCOUNTER — Other Ambulatory Visit: Payer: Self-pay

## 2013-02-17 ENCOUNTER — Telehealth: Payer: Self-pay | Admitting: Internal Medicine

## 2013-02-27 ENCOUNTER — Other Ambulatory Visit: Payer: 59

## 2013-02-27 ENCOUNTER — Ambulatory Visit: Payer: 59

## 2013-02-27 ENCOUNTER — Telehealth: Payer: Self-pay

## 2013-02-27 NOTE — Telephone Encounter (Signed)
lvm that today is second missed appt. Please call us so we can help her make her appt.

## 2013-02-28 ENCOUNTER — Telehealth: Payer: Self-pay | Admitting: Internal Medicine

## 2013-02-28 ENCOUNTER — Other Ambulatory Visit: Payer: Self-pay

## 2013-02-28 NOTE — Telephone Encounter (Signed)
s.w. pt and advised on Feb appt....pt ok adn aware °

## 2013-03-19 ENCOUNTER — Ambulatory Visit (HOSPITAL_BASED_OUTPATIENT_CLINIC_OR_DEPARTMENT_OTHER): Payer: 59 | Admitting: Internal Medicine

## 2013-03-19 ENCOUNTER — Telehealth: Payer: Self-pay | Admitting: Internal Medicine

## 2013-03-19 ENCOUNTER — Other Ambulatory Visit: Payer: 59

## 2013-03-19 ENCOUNTER — Other Ambulatory Visit (HOSPITAL_BASED_OUTPATIENT_CLINIC_OR_DEPARTMENT_OTHER): Payer: 59

## 2013-03-19 ENCOUNTER — Ambulatory Visit: Payer: 59

## 2013-03-19 ENCOUNTER — Encounter (INDEPENDENT_AMBULATORY_CARE_PROVIDER_SITE_OTHER): Payer: Self-pay

## 2013-03-19 VITALS — BP 110/52 | HR 102 | Temp 98.2°F | Resp 20 | Ht 67.5 in | Wt 161.1 lb

## 2013-03-19 DIAGNOSIS — D510 Vitamin B12 deficiency anemia due to intrinsic factor deficiency: Secondary | ICD-10-CM

## 2013-03-19 DIAGNOSIS — D509 Iron deficiency anemia, unspecified: Secondary | ICD-10-CM

## 2013-03-19 DIAGNOSIS — D649 Anemia, unspecified: Secondary | ICD-10-CM

## 2013-03-19 DIAGNOSIS — E538 Deficiency of other specified B group vitamins: Secondary | ICD-10-CM

## 2013-03-19 LAB — COMPREHENSIVE METABOLIC PANEL (CC13)
ALK PHOS: 64 U/L (ref 40–150)
ALT: 7 U/L (ref 0–55)
AST: 16 U/L (ref 5–34)
Albumin: 3.3 g/dL — ABNORMAL LOW (ref 3.5–5.0)
Anion Gap: 10 mEq/L (ref 3–11)
BUN: 8 mg/dL (ref 7.0–26.0)
CO2: 23 mEq/L (ref 22–29)
CREATININE: 0.7 mg/dL (ref 0.6–1.1)
Calcium: 9.4 mg/dL (ref 8.4–10.4)
Chloride: 106 mEq/L (ref 98–109)
Glucose: 85 mg/dl (ref 70–140)
POTASSIUM: 3.7 meq/L (ref 3.5–5.1)
Sodium: 138 mEq/L (ref 136–145)
Total Bilirubin: <0.2 mg/dL (ref 0.20–1.20)
Total Protein: 7 g/dL (ref 6.4–8.3)

## 2013-03-19 LAB — CBC WITH DIFFERENTIAL/PLATELET
BASO%: 0.7 % (ref 0.0–2.0)
Basophils Absolute: 0.1 10*3/uL (ref 0.0–0.1)
EOS%: 1.2 % (ref 0.0–7.0)
Eosinophils Absolute: 0.1 10*3/uL (ref 0.0–0.5)
HCT: 33.9 % — ABNORMAL LOW (ref 34.8–46.6)
HGB: 11.2 g/dL — ABNORMAL LOW (ref 11.6–15.9)
LYMPH#: 2.5 10*3/uL (ref 0.9–3.3)
LYMPH%: 22.1 % (ref 14.0–49.7)
MCH: 27.6 pg (ref 25.1–34.0)
MCHC: 32.9 g/dL (ref 31.5–36.0)
MCV: 83.8 fL (ref 79.5–101.0)
MONO#: 0.6 10*3/uL (ref 0.1–0.9)
MONO%: 5.2 % (ref 0.0–14.0)
NEUT#: 8 10*3/uL — ABNORMAL HIGH (ref 1.5–6.5)
NEUT%: 70.8 % (ref 38.4–76.8)
Platelets: 278 10*3/uL (ref 145–400)
RBC: 4.05 10*6/uL (ref 3.70–5.45)
RDW: 12.7 % (ref 11.2–14.5)
WBC: 11.3 10*3/uL — ABNORMAL HIGH (ref 3.9–10.3)

## 2013-03-19 NOTE — Telephone Encounter (Signed)
gave pt appt for labs and MD for July 2015

## 2013-03-19 NOTE — Patient Instructions (Signed)
High-Fiber Diet Fiber is found in fruits, vegetables, and grains. A high-fiber diet encourages the addition of more whole grains, legumes, fruits, and vegetables in your diet. The recommended amount of fiber for adult males is 38 g per day. For adult females, it is 25 g per day. Pregnant and lactating women should get 28 g of fiber per day. If you have a digestive or bowel problem, ask your caregiver for advice before adding high-fiber foods to your diet. Eat a variety of high-fiber foods instead of only a select few type of foods.  PURPOSE  To increase stool bulk.  To make bowel movements more regular to prevent constipation.  To lower cholesterol.  To prevent overeating. WHEN IS THIS DIET USED?  It may be used if you have constipation and hemorrhoids.  It may be used if you have uncomplicated diverticulosis (intestine condition) and irritable bowel syndrome.  It may be used if you need help with weight management.  It may be used if you want to add it to your diet as a protective measure against atherosclerosis, diabetes, and cancer. SOURCES OF FIBER  Whole-grain breads and cereals.  Fruits, such as apples, oranges, bananas, berries, prunes, and pears.  Vegetables, such as green peas, carrots, sweet potatoes, beets, broccoli, cabbage, spinach, and artichokes.  Legumes, such split peas, soy, lentils.  Almonds. FIBER CONTENT IN FOODS Starches and Grains / Dietary Fiber (g)  Cheerios, 1 cup / 3 g  Corn Flakes cereal, 1 cup / 0.7 g  Rice crispy treat cereal, 1 cup / 0.3 g  Instant oatmeal (cooked),  cup / 2 g  Frosted wheat cereal, 1 cup / 5.1 g  Brown, long-grain rice (cooked), 1 cup / 3.5 g  White, long-grain rice (cooked), 1 cup / 0.6 g  Enriched macaroni (cooked), 1 cup / 2.5 g Legumes / Dietary Fiber (g)  Baked beans (canned, plain, or vegetarian),  cup / 5.2 g  Kidney beans (canned),  cup / 6.8 g  Pinto beans (cooked),  cup / 5.5 g Breads and Crackers  / Dietary Fiber (g)  Plain or honey graham crackers, 2 squares / 0.7 g  Saltine crackers, 3 squares / 0.3 g  Plain, salted pretzels, 10 pieces / 1.8 g  Whole-wheat bread, 1 slice / 1.9 g  White bread, 1 slice / 0.7 g  Raisin bread, 1 slice / 1.2 g  Plain bagel, 3 oz / 2 g  Flour tortilla, 1 oz / 0.9 g  Corn tortilla, 1 small / 1.5 g  Hamburger or hotdog bun, 1 small / 0.9 g Fruits / Dietary Fiber (g)  Apple with skin, 1 medium / 4.4 g  Sweetened applesauce,  cup / 1.5 g  Banana,  medium / 1.5 g  Grapes, 10 grapes / 0.4 g  Orange, 1 small / 2.3 g  Raisin, 1.5 oz / 1.6 g  Melon, 1 cup / 1.4 g Vegetables / Dietary Fiber (g)  Green beans (canned),  cup / 1.3 g  Carrots (cooked),  cup / 2.3 g  Broccoli (cooked),  cup / 2.8 g  Peas (cooked),  cup / 4.4 g  Mashed potatoes,  cup / 1.6 g  Lettuce, 1 cup / 0.5 g  Corn (canned),  cup / 1.6 g  Tomato,  cup / 1.1 g Document Released: 01/09/2005 Document Revised: 07/11/2011 Document Reviewed: 04/13/2011 [facility] Patient Information 2014 [place], [place]. Fiber Content in Foods Drinking plenty of fluids and consuming foods high in fiber can help  with constipation. See the list below for the fiber content of some common foods. Starches and Grains / Dietary Fiber (g)  Cheerios, 1 cup / 3 g  Kellogg's Corn Flakes, 1 cup / 0.7 g  Rice Krispies, 1  cup / 0.3 g  Quaker Oat Life Cereal,  cup / 2.1 g  Oatmeal, instant (cooked),  cup / 2 g  Kellogg's Frosted Mini Wheats, 1 cup / 5.1 g  Rice, brown, long-grain (cooked), 1 cup / 3.5 g  Rice, white, long-grain (cooked), 1 cup / 0.6 g  Macaroni, cooked, enriched, 1 cup / 2.5 g Legumes / Dietary Fiber (g)  Beans, baked, canned, plain or vegetarian,  cup / 5.2 g  Beans, kidney, canned,  cup / 6.8 g  Beans, pinto, dried (cooked),  cup / 7.7 g  Beans, pinto, canned,  cup / 5.5 g Breads and Crackers / Dietary Fiber (g)  Graham crackers, plain or  honey, 2 squares / 0.7 g  Saltine crackers, 3 squares / 0.3 g  Pretzels, plain, salted, 10 pieces / 1.8 g  Bread, whole-wheat, 1 slice / 1.9 g  Bread, white, 1 slice / 0.7 g  Bread, raisin, 1 slice / 1.2 g  Bagel, plain, 3 oz / 2 g  Tortilla, flour, 1 oz / 0.9 g  Tortilla, corn, 1 small / 1.5 g  Bun, hamburger or hotdog, 1 small / 0.9 g Fruits / Dietary Fiber (g)  Apple, raw with skin, 1 medium / 4.4 g  Applesauce, sweetened,  cup / 1.5 g  Banana,  medium / 1.5 g  Grapes, 10 grapes / 0.4 g  Orange, 1 small / 2.3 g  Raisin, 1.5 oz / 1.6 g  Melon, 1 cup / 1.4 g Vegetables / Dietary Fiber (g)  Green beans, canned,  cup / 1.3 g  Carrots (cooked),  cup / 2.3 g  Broccoli (cooked),  cup / 2.8 g  Peas, frozen (cooked),  cup / 4.4 g  Potatoes, mashed,  cup / 1.6 g  Lettuce, 1 cup / 0.5 g  Corn, canned,  cup / 1.6 g  Tomato,  cup / 1.1 g Document Released: 05/28/2006 Document Revised: 04/03/2011 Document Reviewed: 07/23/2006 [employer] Patient Information 2014 [place], [place].

## 2013-03-20 LAB — IRON AND TIBC CHCC
%SAT: 7 % — ABNORMAL LOW (ref 21–57)
Iron: 31 ug/dL — ABNORMAL LOW (ref 41–142)
TIBC: 434 ug/dL (ref 236–444)
UIBC: 403 ug/dL — AB (ref 120–384)

## 2013-03-20 LAB — FERRITIN CHCC: FERRITIN: 14 ng/mL (ref 9–269)

## 2013-03-20 NOTE — Progress Notes (Signed)
[place] [address] [place] [address]  DIAGNOSIS: Anemia - Plan: CBC with Differential, Ferritin, Iron and TIBC, CBC with Differential, Ferritin, Iron and TIBC  Pernicious anemia  Chief Complaint: Anemia  CURRENT THERAPY: Monthly vitamin B12 shots; Ferrous sulfate 325 mg daily.   INTERVAL HISTORY: Teresa Brennan 28 y.o. female with a history of pernicious anemia and vitamin B12 deficiency who have been lost-to-follow up because of her change in her insurance.  She has gotten a new job that provides coverage and made a follow-up appointment.  Her last vitamin B12 shot was in September 24,2014.  She report that she is now 84-months prenant and expecting in mid-June. She reports worsening constipation while on her daily oral iron.   She stopped taking iron nearly one month ago. She denies pica symptoms.  She also denies recent hospitalizations or emergency room visits.    MEDICAL HISTORY: Past Medical History  Diagnosis Date  . Ovarian tumor   . Headache(784.0)   . Anemia   . History of fatigue 03/10/11  . Fibroid   . Irregular menses   . Migraine   . Painful intercourse 11/15/10  . Dyspareunia, female     INTERIM HISTORY: has Vitamin B12 deficiency; Anemia; and Pernicious anemia on her problem list.    ALLERGIES:  has No Known Allergies.  MEDICATIONS: has a current medication list which includes the following prescription(s): cyanocobalamin and prenatal vitamin w/fe, fa.  SURGICAL HISTORY:  Past Surgical History  Procedure Laterality Date  . Hysteroscopy      REVIEW OF SYSTEMS:   Constitutional: Denies fevers, chills or abnormal weight loss Eyes: Denies blurriness of vision Ears, nose, mouth, throat, and face: Denies mucositis or sore throat Respiratory: Denies cough, dyspnea or wheezes Cardiovascular: Denies palpitation, chest discomfort or lower extremity swelling Gastrointestinal:  Denies  nausea, heartburn or change in bowel habits Skin: Denies abnormal skin rashes Lymphatics: Denies new lymphadenopathy or easy bruising Neurological:Denies numbness, tingling or new weaknesses Behavioral/Psych: Mood is stable, no new changes  All other systems were reviewed with the patient and are negative.  PHYSICAL EXAMINATION: ECOG PERFORMANCE STATUS: 0 - Asymptomatic  Blood pressure 110/52, pulse 102, temperature 98.2 F (36.8 C), temperature source Oral, resp. rate 20, height 5' 7.5" (1.715 m), weight 161 lb 1.6 oz (73.074 kg), last menstrual period 10/08/2012, SpO2 100.00%.  GENERAL:alert, no distress and comfortable; well developed and well nourished.  SKIN: skin color, texture, turgor are normal, no rashes or significant lesions; Minor facial scarring from acne.  EYES: normal, Conjunctiva are pink and non-injected, sclera clear OROPHARYNX:no exudate, no erythema and lips, buccal mucosa, and tongue normal  NECK: supple, thyroid normal size, non-tender, without nodularity LYMPH:  no palpable lymphadenopathy in the cervical or supraclavicular LUNGS: clear to auscultation and percussion with normal breathing effort HEART: regular rate & rhythm and no murmurs and no lower extremity edema ABDOMEN:abdomen soft, non-tender and normal bowel sounds; Protuberant with a 5 month pregnancy.  Musculoskeletal:no cyanosis of digits and no clubbing  NEURO: alert & oriented x 3 with fluent speech, no focal motor/sensory deficits  LABORATORY DATA: Results for orders placed in visit on 03/19/13 (from the past 48 hour(s))  CBC WITH DIFFERENTIAL     Status: Abnormal   Collection Time    03/19/13  3:42 PM      Result Value Ref Range   WBC 11.3 (*) 3.9 - 10.3 10e3/uL   NEUT# 8.0 (*) 1.5 -  6.5 10e3/uL   HGB 11.2 (*) 11.6 - 15.9 g/dL   HCT 33.9 (*) 34.8 - 46.6 %   Platelets 278  145 - 400 10e3/uL   MCV 83.8  79.5 - 101.0 fL   MCH 27.6  25.1 - 34.0 pg   MCHC 32.9  31.5 - 36.0 g/dL   RBC 4.05  3.70  - 5.45 10e6/uL   RDW 12.7  11.2 - 14.5 %   lymph# 2.5  0.9 - 3.3 10e3/uL   MONO# 0.6  0.1 - 0.9 10e3/uL   Eosinophils Absolute 0.1  0.0 - 0.5 10e3/uL   Basophils Absolute 0.1  0.0 - 0.1 10e3/uL   NEUT% 70.8  38.4 - 76.8 %   LYMPH% 22.1  14.0 - 49.7 %   MONO% 5.2  0.0 - 14.0 %   EOS% 1.2  0.0 - 7.0 %   BASO% 0.7  0.0 - 2.0 %  COMPREHENSIVE METABOLIC PANEL (VO35)     Status: Abnormal   Collection Time    03/19/13  3:42 PM      Result Value Ref Range   Sodium 138  136 - 145 mEq/L   Potassium 3.7  3.5 - 5.1 mEq/L   Chloride 106  98 - 109 mEq/L   CO2 23  22 - 29 mEq/L   Glucose 85  70 - 140 mg/dl   BUN 8.0  7.0 - 26.0 mg/dL   Creatinine 0.7  0.6 - 1.1 mg/dL   Total Bilirubin <0.20  0.20 - 1.20 mg/dL   Alkaline Phosphatase 64  40 - 150 U/L   AST 16  5 - 34 U/L   ALT 7  0 - 55 U/L   Total Protein 7.0  6.4 - 8.3 g/dL   Albumin 3.3 (*) 3.5 - 5.0 g/dL   Calcium 9.4  8.4 - 10.4 mg/dL   Anion Gap 10  3 - 11 mEq/L    Iron/TIBC/Ferritin    Component Value Date/Time   IRON 35* 10/16/2012 1455   IRON 93 12/06/2011 0843   TIBC 374 10/16/2012 1455   TIBC 284 12/06/2011 0843   FERRITIN 15 10/16/2012 1455   FERRITIN 44 12/06/2011 0843    RADIOGRAPHIC STUDIES: No results found.  ASSESSMENT: Pernicious anemia; Iron deficiency anemia.   PLAN:  1. Pernicious anemia for which she receives monthly vitamin B12 injections. She will receive a shot  monthly. She will continue the current regimen. We will check her vitamin B12 level. Her MCV remains normacytic.  2. Iron deficiency anemia. She will resume  iron in the form of NuIron twice daily. We counseled her extensively to use fiber supplementation and metamucil while on iron.  Her constipation is also a common problem associated with her pregnancy.  Her iron studies are pending. We will repeat her iron indices in 2 months. She was provided a handout on symptoms of anemia and high fiber diet.  3. She follows up in 4 months' time for iron studies  post delivery of her first child.   All questions were answered. The patient knows to call the clinic with any problems, questions or concerns. We can certainly see the patient much sooner if necessary.  I spent 10 minutes counseling the patient face to face. The total time spent in the appointment was 15 minutes.    Demarqus Jocson, MD 03/20/2013 8:42 AM

## 2013-04-21 ENCOUNTER — Ambulatory Visit (INDEPENDENT_AMBULATORY_CARE_PROVIDER_SITE_OTHER): Payer: 59 | Admitting: Family Medicine

## 2013-04-21 VITALS — BP 108/64 | HR 104 | Temp 98.5°F | Resp 20 | Ht 66.0 in | Wt 165.0 lb

## 2013-04-21 DIAGNOSIS — Z331 Pregnant state, incidental: Secondary | ICD-10-CM

## 2013-04-21 DIAGNOSIS — J3489 Other specified disorders of nose and nasal sinuses: Secondary | ICD-10-CM

## 2013-04-21 DIAGNOSIS — R509 Fever, unspecified: Secondary | ICD-10-CM

## 2013-04-21 DIAGNOSIS — R0981 Nasal congestion: Secondary | ICD-10-CM

## 2013-04-21 DIAGNOSIS — Z3490 Encounter for supervision of normal pregnancy, unspecified, unspecified trimester: Secondary | ICD-10-CM

## 2013-04-21 DIAGNOSIS — D649 Anemia, unspecified: Secondary | ICD-10-CM

## 2013-04-21 LAB — POCT INFLUENZA A/B
Influenza A, POC: NEGATIVE
Influenza B, POC: NEGATIVE

## 2013-04-21 MED ORDER — AMOXICILLIN 875 MG PO TABS: 875.0000 mg | ORAL_TABLET | Freq: Two times a day (BID) | ORAL | Status: DC

## 2013-04-21 NOTE — Progress Notes (Addendum)
Subjective:   This chart was scribed for Teresa Haber, MD by Forrestine Him, [facility] Scribe. This patient was seen in room 1 and the patient's care was started 8:00 PM.    Patient ID: Teresa Brennan, female    DOB: Apr 01, 1985, 28 y.o.   MRN: [id number]  Chief Complaint  Patient presents with   Cough    3 days, around a kid who had flu   Headache   Shortness of Breath   Sore Throat   Nasal Congestion     HPI  HPI Comments: Teresa Brennan 6 months gestation is a 28 y.o. Female who presents to [facility] complaining of a cough x 3 days that is progressively worsening. She also reports sore throat, HA, SOB, nasal congestion, and a fever (this has now resolved). She has tried OTC pregnancy safe medications with mild temporary improvement. Pt states she has been around her little cousin this past weekend and states he was recently diagnosed with the Flu. She denies getting a Flu vaccination this season. At this time she denies any abdominal pain, nausea, vomiting, or diarrhea.  A fetal heartbeat was detected using a fetal heart doppler.  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  Past Medical History  Diagnosis Date   Ovarian tumor    Headache(784.0)    Anemia    History of fatigue 03/10/11   Fibroid    Irregular menses    Migraine    Painful intercourse 11/15/10   Dyspareunia, female     History   Social History   Marital Status: Single    Spouse Name: N/A    Number of Children: N/A   Years of Education: N/A   Occupational History   Not on file.   Social History Main Topics   Smoking status: Never Smoker    Smokeless tobacco: Not on file   Alcohol Use: No   Drug Use: No   Sexual Activity: Yes    Birth Control/ Protection: Pill   Other Topics Concern   Not on file   Social History Narrative   No narrative on file    Review of Systems  Constitutional: Positive for fever. Negative for  chills.  HENT: Positive for congestion and sore throat.   Respiratory: Positive for shortness of breath.   Gastrointestinal: Negative for nausea, vomiting and diarrhea.  Skin: Negative for rash.  Neurological: Positive for headaches.  Psychiatric/Behavioral: Negative for confusion.    Triage Vitals: BP 108/64   Pulse 104   Temp(Src) 98.5 F (36.9 C)   Resp 20   Ht 5\' 6"  (1.676 m)   Wt 165 lb (74.844 kg)   BMI 26.64 kg/m2   SpO2 100%   LMP 10/08/2012   Objective:  Physical Exam  Nursing note and vitals reviewed. Constitutional: She is oriented to person, place, and time. She appears well-developed and well-nourished.  HENT:  Head: Normocephalic and atraumatic.  Eyes: EOM are normal.  Neck: Normal range of motion.  Cardiovascular: Normal rate.   Pulmonary/Chest: Effort normal.  Musculoskeletal: Normal range of motion.  Neurological: She is alert and oriented to person, place, and time.  Skin: Skin is warm and dry.  Psychiatric: She has a normal mood and affect. Her behavior is normal.   Using Doppler we identified the fetal heart beat which is about 1:30 beats per minute.  Results for orders placed in visit on 03/19/13  CBC WITH DIFFERENTIAL  Result Value Ref Range   WBC 11.3 (*) 3.9 - 10.3 10e3/uL   NEUT# 8.0 (*) 1.5 - 6.5 10e3/uL   HGB 11.2 (*) 11.6 - 15.9 g/dL   HCT 33.9 (*) 34.8 - 46.6 %   Platelets 278  145 - 400 10e3/uL   MCV 83.8  79.5 - 101.0 fL   MCH 27.6  25.1 - 34.0 pg   MCHC 32.9  31.5 - 36.0 g/dL   RBC 4.05  3.70 - 5.45 10e6/uL   RDW 12.7  11.2 - 14.5 %   lymph# 2.5  0.9 - 3.3 10e3/uL   MONO# 0.6  0.1 - 0.9 10e3/uL   Eosinophils Absolute 0.1  0.0 - 0.5 10e3/uL   Basophils Absolute 0.1  0.0 - 0.1 10e3/uL   NEUT% 70.8  38.4 - 76.8 %   LYMPH% 22.1  14.0 - 49.7 %   MONO% 5.2  0.0 - 14.0 %   EOS% 1.2  0.0 - 7.0 %   BASO% 0.7  0.0 - 2.0 %  COMPREHENSIVE METABOLIC PANEL (KN39)      Result Value Ref Range   Sodium 138  136 - 145 mEq/L   Potassium 3.7  3.5  - 5.1 mEq/L   Chloride 106  98 - 109 mEq/L   CO2 23  22 - 29 mEq/L   Glucose 85  70 - 140 mg/dl   BUN 8.0  7.0 - 26.0 mg/dL   Creatinine 0.7  0.6 - 1.1 mg/dL   Total Bilirubin <0.20  0.20 - 1.20 mg/dL   Alkaline Phosphatase 64  40 - 150 U/L   AST 16  5 - 34 U/L   ALT 7  0 - 55 U/L   Total Protein 7.0  6.4 - 8.3 g/dL   Albumin 3.3 (*) 3.5 - 5.0 g/dL   Calcium 9.4  8.4 - 10.4 mg/dL   Anion Gap 10  3 - 11 mEq/L  IRON AND TIBC [facility]      Result Value Ref Range   Iron 31 (*) 41 - 142 ug/dL   TIBC 434  236 - 444 ug/dL   UIBC 403 (*) 120 - 384 ug/dL   %SAT 7 (*) 21 - 57 %  FERRITIN [facility]      Result Value Ref Range   Ferritin 14  9 - 269 ng/ml   Results for orders placed in visit on 04/21/13  POCT INFLUENZA A/B      Result Value Ref Range   Influenza A, POC Negative     Influenza B, POC Negative       Assessment & Plan:  Fever, unspecified - Plan: POCT Influenza A/B  Sinus congestion - Plan: POCT Influenza A/B  Pregnancy  Anemia  amox 875 bid x 10 days for sinus infection  Signed, Teresa Haber, MD   I personally performed the services described in this documentation, which was scribed in my presence. The recorded information has been reviewed and is accurate.

## 2013-04-21 NOTE — Patient Instructions (Signed)

## 2013-05-16 ENCOUNTER — Other Ambulatory Visit: Payer: 59

## 2013-05-16 ENCOUNTER — Telehealth: Payer: Self-pay | Admitting: Internal Medicine

## 2013-05-16 NOTE — Telephone Encounter (Signed)
returned pt call and s.w. pt to r/s todays lab to 4.27.14...done...pt aware of new d.t

## 2013-05-19 ENCOUNTER — Telehealth: Payer: Self-pay | Admitting: Internal Medicine

## 2013-05-19 ENCOUNTER — Other Ambulatory Visit: Payer: 59

## 2013-05-19 NOTE — Telephone Encounter (Signed)
returned pt call to r/s appt...lvm for pt to call back with d.t she need to r/s to

## 2013-06-18 LAB — OB RESULTS CONSOLE GBS: GBS: POSITIVE

## 2013-07-14 ENCOUNTER — Encounter (HOSPITAL_COMMUNITY): Payer: Self-pay | Admitting: *Deleted

## 2013-07-14 ENCOUNTER — Telehealth (HOSPITAL_COMMUNITY): Payer: Self-pay | Admitting: *Deleted

## 2013-07-14 NOTE — Telephone Encounter (Signed)
Preadmission screen  

## 2013-07-17 ENCOUNTER — Inpatient Hospital Stay (HOSPITAL_COMMUNITY)
Admission: RE | Admit: 2013-07-17 | Discharge: 2013-07-21 | DRG: 765 | Disposition: A | Discharge status: Home / Self Care | Payer: 59 | Source: Ambulatory Visit | Attending: Obstetrics and Gynecology | Admitting: Obstetrics and Gynecology

## 2013-07-17 VITALS — BP 100/64 | HR 74 | Temp 98.4°F | Resp 19 | Ht 66.0 in | Wt 176.0 lb

## 2013-07-17 DIAGNOSIS — D649 Anemia, unspecified: Secondary | ICD-10-CM | POA: Diagnosis present

## 2013-07-17 DIAGNOSIS — O3410 Maternal care for benign tumor of corpus uteri, unspecified trimester: Secondary | ICD-10-CM

## 2013-07-17 DIAGNOSIS — O480 Post-term pregnancy: Secondary | ICD-10-CM | POA: Diagnosis present

## 2013-07-17 DIAGNOSIS — O99892 Other specified diseases and conditions complicating childbirth: Secondary | ICD-10-CM | POA: Diagnosis present

## 2013-07-17 DIAGNOSIS — D689 Coagulation defect, unspecified: Secondary | ICD-10-CM | POA: Diagnosis present

## 2013-07-17 DIAGNOSIS — Z22330 Carrier of Group B streptococcus: Secondary | ICD-10-CM

## 2013-07-17 DIAGNOSIS — R87610 Atypical squamous cells of undetermined significance on cytologic smear of cervix (ASC-US): Secondary | ICD-10-CM | POA: Insufficient documentation

## 2013-07-17 DIAGNOSIS — D259 Leiomyoma of uterus, unspecified: Secondary | ICD-10-CM | POA: Diagnosis present

## 2013-07-17 DIAGNOSIS — O34599 Maternal care for other abnormalities of gravid uterus, unspecified trimester: Secondary | ICD-10-CM | POA: Diagnosis present

## 2013-07-17 DIAGNOSIS — Z98891 History of uterine scar from previous surgery: Secondary | ICD-10-CM

## 2013-07-17 DIAGNOSIS — D279 Benign neoplasm of unspecified ovary: Secondary | ICD-10-CM | POA: Diagnosis present

## 2013-07-17 DIAGNOSIS — R87810 Cervical high risk human papillomavirus (HPV) DNA test positive: Secondary | ICD-10-CM | POA: Diagnosis present

## 2013-07-17 DIAGNOSIS — O9902 Anemia complicating childbirth: Secondary | ICD-10-CM | POA: Diagnosis present

## 2013-07-17 DIAGNOSIS — O9912 Other diseases of the blood and blood-forming organs and certain disorders involving the immune mechanism complicating childbirth: Secondary | ICD-10-CM | POA: Diagnosis present

## 2013-07-17 DIAGNOSIS — D4959 Neoplasm of unspecified behavior of other genitourinary organ: Secondary | ICD-10-CM | POA: Diagnosis present

## 2013-07-17 DIAGNOSIS — O9989 Other specified diseases and conditions complicating pregnancy, childbirth and the puerperium: Secondary | ICD-10-CM

## 2013-07-17 LAB — CBC
HEMATOCRIT: 30.2 % — AB (ref 36.0–46.0)
HEMOGLOBIN: 10 g/dL — AB (ref 12.0–15.0)
MCH: 24.4 pg — ABNORMAL LOW (ref 26.0–34.0)
MCHC: 33.1 g/dL (ref 30.0–36.0)
MCV: 73.8 fL — ABNORMAL LOW (ref 78.0–100.0)
Platelets: 258 10*3/uL (ref 150–400)
RBC: 4.09 MIL/uL (ref 3.87–5.11)
RDW: 14.7 % (ref 11.5–15.5)
WBC: 11.2 10*3/uL — AB (ref 4.0–10.5)

## 2013-07-17 LAB — TYPE AND SCREEN
ABO/RH(D): O POS
Antibody Screen: NEGATIVE

## 2013-07-17 MED ORDER — MISOPROSTOL 25 MCG QUARTER TABLET
25.0000 ug | ORAL_TABLET | ORAL | Status: DC | PRN
Start: 2013-07-17 — End: 2013-07-18
  Administered 2013-07-17 – 2013-07-18 (×2): 25 ug via VAGINAL
  Filled 2013-07-17 (×2): qty 0.25

## 2013-07-17 MED ORDER — OXYCODONE-ACETAMINOPHEN 5-325 MG PO TABS: 1.0000 | ORAL_TABLET | ORAL | Status: DC | PRN

## 2013-07-17 MED ORDER — LACTATED RINGERS IV SOLN
INTRAVENOUS | Status: DC
  Administered 2013-07-17 – 2013-07-18 (×4): via INTRAVENOUS

## 2013-07-17 MED ORDER — ACETAMINOPHEN 325 MG PO TABS: 650.0000 mg | ORAL_TABLET | ORAL | Status: DC | PRN

## 2013-07-17 MED ORDER — TERBUTALINE SULFATE 1 MG/ML IJ SOLN: 0.2500 mg | Freq: Once | INTRAMUSCULAR | Status: AC | PRN

## 2013-07-17 MED ORDER — LACTATED RINGERS IV SOLN: 500.0000 mL | INTRAVENOUS | Status: DC | PRN

## 2013-07-17 MED ORDER — OXYTOCIN 40 UNITS IN LACTATED RINGERS INFUSION - SIMPLE MED: 62.5000 mL/h | INTRAVENOUS | Status: DC

## 2013-07-17 MED ORDER — PENICILLIN G POTASSIUM 5000000 UNITS IJ SOLR
5.0000 10*6.[IU] | Freq: Once | INTRAVENOUS | Status: AC
  Administered 2013-07-18: 5 10*6.[IU] via INTRAVENOUS
  Filled 2013-07-17: qty 5

## 2013-07-17 MED ORDER — ONDANSETRON HCL 4 MG/2ML IJ SOLN: 4.0000 mg | Freq: Four times a day (QID) | INTRAMUSCULAR | Status: DC | PRN

## 2013-07-17 MED ORDER — ZOLPIDEM TARTRATE 5 MG PO TABS
5.0000 mg | ORAL_TABLET | Freq: Every evening | ORAL | Status: DC | PRN
  Administered 2013-07-18: 5 mg via ORAL
  Filled 2013-07-17: qty 1

## 2013-07-17 MED ORDER — CITRIC ACID-SODIUM CITRATE 334-500 MG/5ML PO SOLN
30.0000 mL | ORAL | Status: DC | PRN
  Administered 2013-07-18: 30 mL via ORAL
  Filled 2013-07-17: qty 15

## 2013-07-17 MED ORDER — PENICILLIN G POTASSIUM 5000000 UNITS IJ SOLR
2.5000 10*6.[IU] | INTRAVENOUS | Status: DC
  Administered 2013-07-18: 2.5 10*6.[IU] via INTRAVENOUS
  Filled 2013-07-17 (×5): qty 2.5

## 2013-07-17 MED ORDER — IBUPROFEN 600 MG PO TABS: 600.0000 mg | ORAL_TABLET | Freq: Four times a day (QID) | ORAL | Status: DC | PRN

## 2013-07-17 MED ORDER — LIDOCAINE HCL (PF) 1 % IJ SOLN: 30.0000 mL | INTRAMUSCULAR | Status: DC | PRN

## 2013-07-17 MED ORDER — OXYTOCIN BOLUS FROM INFUSION: 500.0000 mL | INTRAVENOUS | Status: DC

## 2013-07-17 NOTE — H&P (Signed)
Teresa Brennan is a 28 y.o. female, G1P0 at [redacted]w[redacted]d, presenting for IOL for PD. Pt reports some mild cramping.  Denies VB, LOF, recent fever, resp or GI c/o's, UTI or PIH s/s. GFM. Desires epidural.  Patient Active Problem List   Diagnosis Date Noted  . Uterine fibroid 07/17/2013  . 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 Cervical high risk HPV (human papillomavirus) test positive 07/17/2013  . Post-dates pregnancy 07/17/2013  . Pernicious anemia 10/03/2011  . Vitamin B12 deficiency 03/10/2011  . Anemia 03/10/2011    History of present pregnancy: Patient entered care at 8 weeks.   EDC of 07/10/13 was established by LMP.   Anatomy scan:  19 weeks, with normal findings and an anterior placenta.  Uterine fibroid noted. Additional [place] evaluations:   [redacted]w[redacted]d for lagging [facility] - BPP = 8/8, Fluid 60th%ile [redacted]w[redacted]d for S<D - EFW 16.9%ile, vtx, AFI 35th%ile, abd circ <2.3%ile.   Significant prenatal events:  none   Last evaluation:  07/17/13 at [redacted]w[redacted]d   OB History   Grav Para Term Preterm Abortions TAB SAB Ect Mult Living   1              Past Medical History  Diagnosis Date  . Ovarian tumor   . Headache(784.0)   . Anemia   . History of fatigue 03/10/11  . Fibroid   . Irregular menses   . Migraine   . Painful intercourse 11/15/10  . Dyspareunia, female   . Asthma    Past Surgical History  Procedure Laterality Date  . Hysteroscopy     Family History: family history is not on file. Social History:  reports that she has never smoked. She does not have any smokeless tobacco history on file. She reports that she does not drink alcohol or use illicit drugs.   Prenatal Transfer Tool  Maternal Diabetes: No Genetic Screening: Normal Maternal Ultrasounds/Referrals: Normal Fetal Ultrasounds or other Referrals:  None Maternal Substance Abuse:  No Significant Maternal Medications:  None Significant Maternal Lab Results: Lab values include: Group B Strep  positive    ROS: see HPI above, all other systems are negative   No Known Allergies     Temperature 98.6 F (37 C), temperature source Oral, resp. rate 18, last menstrual period 10/03/2012.  Chest clear Heart RRR without murmur Abd gravid, NT Ext: WNL  FHR: Cat I UCs:  Irregular  Prenatal labs: ABO, Rh: O/Positive/-- (10/30 0000) Antibody: Negative (10/30 0000) Rubella:   Immune RPR: Nonreactive (10/30 0000)  HBsAg: Negative (10/30 0000)  HIV: Non-reactive (10/30 0000)  GBS: Positive (05/27 0000) Sickle cell/Hgb electrophoresis:  n/a Pap:  01/29/13 ASCUS with HRHPV - Colpo done 04/14/13 GC:  neg Chlamydia:  neg Genetic screenings:  Quand WNL Glucola:  123 Other:  04/14/13 Amylase, lipase and Hepatic function panel WNL    Assessment IUP at [redacted]w[redacted]d IOL for PD GBS pos Bishop score - 4 AC lag noted on 37 week u/s  Admit to BS per c/w Dr. Mancel Bale Routine L&D orders PCN for GBS prophylaxis per protocol Cytotec IOL per protocol   Emilee Hero, MSN 07/17/2013, 8:09 PM

## 2013-07-17 NOTE — Plan of Care (Signed)
Problem: Consults Goal: Birthing Suites Patient Information Press F2 to bring up selections list  Outcome: Completed/Met Date Met:  07/17/13  Pt > [redacted] weeks EGA

## 2013-07-17 NOTE — Plan of Care (Signed)
Problem: Consults Goal: Birthing Suites Patient Information Press F2 to bring up selections list   Pt > [redacted] weeks EGA     

## 2013-07-18 ENCOUNTER — Inpatient Hospital Stay (HOSPITAL_COMMUNITY): Payer: 59 | Admitting: Anesthesiology

## 2013-07-18 ENCOUNTER — Encounter (HOSPITAL_COMMUNITY)
Admission: RE | Disposition: A | Discharge status: Home / Self Care | Payer: Self-pay | Source: Ambulatory Visit | Attending: Obstetrics and Gynecology

## 2013-07-18 ENCOUNTER — Other Ambulatory Visit: Payer: 59

## 2013-07-18 ENCOUNTER — Encounter (HOSPITAL_COMMUNITY): Payer: 59 | Admitting: Anesthesiology

## 2013-07-18 ENCOUNTER — Encounter (HOSPITAL_COMMUNITY): Payer: Self-pay

## 2013-07-18 DIAGNOSIS — O480 Post-term pregnancy: Secondary | ICD-10-CM | POA: Diagnosis not present

## 2013-07-18 LAB — RPR

## 2013-07-18 SURGERY — Surgical Case
Anesthesia: Epidural

## 2013-07-18 MED ORDER — FENTANYL CITRATE 0.05 MG/ML IJ SOLN
100.0000 ug | INTRAMUSCULAR | Status: DC | PRN
  Administered 2013-07-18 (×2): 100 ug via INTRAVENOUS
  Filled 2013-07-18 (×2): qty 2

## 2013-07-18 MED ORDER — SCOPOLAMINE 1 MG/3DAYS TD PT72: 1.0000 | MEDICATED_PATCH | Freq: Once | TRANSDERMAL | Status: DC

## 2013-07-18 MED ORDER — TETANUS-DIPHTH-ACELL PERTUSSIS 5-2.5-18.5 LF-MCG/0.5 IM SUSP: 0.5000 mL | Freq: Once | INTRAMUSCULAR | Status: DC

## 2013-07-18 MED ORDER — SIMETHICONE 80 MG PO CHEW
80.0000 mg | CHEWABLE_TABLET | Freq: Three times a day (TID) | ORAL | Status: DC
  Administered 2013-07-19 – 2013-07-21 (×6): 80 mg via ORAL
  Filled 2013-07-18 (×7): qty 1

## 2013-07-18 MED ORDER — NALBUPHINE HCL 10 MG/ML IJ SOLN
5.0000 mg | INTRAMUSCULAR | Status: DC | PRN
  Administered 2013-07-18 – 2013-07-19 (×2): 10 mg via SUBCUTANEOUS
  Filled 2013-07-18: qty 1

## 2013-07-18 MED ORDER — PHENYLEPHRINE 40 MCG/ML (10ML) SYRINGE FOR IV PUSH (FOR BLOOD PRESSURE SUPPORT)
PREFILLED_SYRINGE | INTRAVENOUS | Status: AC
  Filled 2013-07-18: qty 5

## 2013-07-18 MED ORDER — SENNOSIDES-DOCUSATE SODIUM 8.6-50 MG PO TABS
2.0000 | ORAL_TABLET | ORAL | Status: DC
  Administered 2013-07-20 (×2): 2 via ORAL
  Filled 2013-07-18 (×2): qty 2

## 2013-07-18 MED ORDER — FENTANYL 2.5 MCG/ML BUPIVACAINE 1/10 % EPIDURAL INFUSION (WH - ANES)
14.0000 mL/h | INTRAMUSCULAR | Status: DC | PRN
  Administered 2013-07-18: 14 mL/h via EPIDURAL
  Filled 2013-07-18: qty 125

## 2013-07-18 MED ORDER — KETOROLAC TROMETHAMINE 30 MG/ML IJ SOLN: 30.0000 mg | Freq: Four times a day (QID) | INTRAMUSCULAR | Status: AC | PRN

## 2013-07-18 MED ORDER — LACTATED RINGERS IV SOLN
INTRAVENOUS | Status: DC
Start: 2013-07-18 — End: 2013-07-21
  Administered 2013-07-18 – 2013-07-19 (×2): via INTRAVENOUS

## 2013-07-18 MED ORDER — CEFAZOLIN SODIUM-DEXTROSE 2-3 GM-% IV SOLR
INTRAVENOUS | Status: AC
  Filled 2013-07-18: qty 50

## 2013-07-18 MED ORDER — MORPHINE SULFATE 0.5 MG/ML IJ SOLN
INTRAMUSCULAR | Status: AC
  Filled 2013-07-18: qty 10

## 2013-07-18 MED ORDER — PHENYLEPHRINE 40 MCG/ML (10ML) SYRINGE FOR IV PUSH (FOR BLOOD PRESSURE SUPPORT)
80.0000 ug | PREFILLED_SYRINGE | INTRAVENOUS | Status: DC | PRN
  Filled 2013-07-18: qty 10

## 2013-07-18 MED ORDER — TERBUTALINE SULFATE 1 MG/ML IJ SOLN
0.2500 mg | Freq: Once | INTRAMUSCULAR | Status: AC
  Administered 2013-07-18: 0.25 mg via SUBCUTANEOUS

## 2013-07-18 MED ORDER — DIPHENHYDRAMINE HCL 25 MG PO CAPS
25.0000 mg | ORAL_CAPSULE | ORAL | Status: DC | PRN
  Administered 2013-07-19: 25 mg via ORAL
  Filled 2013-07-18 (×2): qty 1

## 2013-07-18 MED ORDER — MORPHINE SULFATE (PF) 0.5 MG/ML IJ SOLN
INTRAMUSCULAR | Status: DC | PRN
  Administered 2013-07-18: 1 mg via INTRAVENOUS
  Administered 2013-07-18: 4 mg via EPIDURAL

## 2013-07-18 MED ORDER — LACTATED RINGERS IV SOLN
INTRAVENOUS | Status: DC
  Administered 2013-07-18: 11:00:00 via INTRAUTERINE

## 2013-07-18 MED ORDER — EPHEDRINE 5 MG/ML INJ: 10.0000 mg | INTRAVENOUS | Status: DC | PRN

## 2013-07-18 MED ORDER — METOCLOPRAMIDE HCL 5 MG/ML IJ SOLN
10.0000 mg | Freq: Once | INTRAMUSCULAR | Status: DC | PRN
Start: 2013-07-18 — End: 2013-07-18

## 2013-07-18 MED ORDER — TERBUTALINE SULFATE 1 MG/ML IJ SOLN
INTRAMUSCULAR | Status: AC
  Filled 2013-07-18: qty 1

## 2013-07-18 MED ORDER — LANOLIN HYDROUS EX OINT: 1.0000 "application " | TOPICAL_OINTMENT | CUTANEOUS | Status: DC | PRN

## 2013-07-18 MED ORDER — MEASLES, MUMPS & RUBELLA VAC ~~LOC~~ INJ: 0.5000 mL | INJECTION | Freq: Once | SUBCUTANEOUS | Status: DC

## 2013-07-18 MED ORDER — SIMETHICONE 80 MG PO CHEW: 80.0000 mg | CHEWABLE_TABLET | ORAL | Status: DC | PRN

## 2013-07-18 MED ORDER — ONDANSETRON HCL 4 MG/2ML IJ SOLN
INTRAMUSCULAR | Status: DC | PRN
  Administered 2013-07-18: 4 mg via INTRAVENOUS

## 2013-07-18 MED ORDER — IBUPROFEN 600 MG PO TABS
600.0000 mg | ORAL_TABLET | Freq: Four times a day (QID) | ORAL | Status: DC
  Administered 2013-07-19 – 2013-07-21 (×9): 600 mg via ORAL
  Filled 2013-07-18 (×9): qty 1

## 2013-07-18 MED ORDER — KETOROLAC TROMETHAMINE 60 MG/2ML IM SOLN
60.0000 mg | Freq: Once | INTRAMUSCULAR | Status: AC | PRN
  Administered 2013-07-18: 60 mg via INTRAMUSCULAR

## 2013-07-18 MED ORDER — METOCLOPRAMIDE HCL 5 MG/ML IJ SOLN
10.0000 mg | Freq: Three times a day (TID) | INTRAMUSCULAR | Status: DC | PRN
  Administered 2013-07-18: 10 mg via INTRAVENOUS
  Filled 2013-07-18: qty 2

## 2013-07-18 MED ORDER — FENTANYL CITRATE 0.05 MG/ML IJ SOLN
25.0000 ug | INTRAMUSCULAR | Status: DC | PRN
  Administered 2013-07-18: 50 ug via INTRAVENOUS

## 2013-07-18 MED ORDER — NALOXONE HCL 0.4 MG/ML IJ SOLN: 0.4000 mg | INTRAMUSCULAR | Status: DC | PRN

## 2013-07-18 MED ORDER — TERBUTALINE SULFATE 1 MG/ML IJ SOLN
INTRAMUSCULAR | Status: AC
  Administered 2013-07-18: 0.25 mg
  Filled 2013-07-18: qty 1

## 2013-07-18 MED ORDER — MEDROXYPROGESTERONE ACETATE 150 MG/ML IM SUSP: 150.0000 mg | INTRAMUSCULAR | Status: DC | PRN

## 2013-07-18 MED ORDER — WITCH HAZEL-GLYCERIN EX PADS: 1.0000 "application " | MEDICATED_PAD | CUTANEOUS | Status: DC | PRN

## 2013-07-18 MED ORDER — KETOROLAC TROMETHAMINE 30 MG/ML IJ SOLN
30.0000 mg | Freq: Four times a day (QID) | INTRAMUSCULAR | Status: AC | PRN
  Administered 2013-07-18: 30 mg via INTRAVENOUS
  Filled 2013-07-18: qty 1

## 2013-07-18 MED ORDER — OXYTOCIN 10 UNIT/ML IJ SOLN
40.0000 [IU] | INTRAVENOUS | Status: DC | PRN
  Administered 2013-07-18: 40 [IU] via INTRAVENOUS

## 2013-07-18 MED ORDER — PHENYLEPHRINE HCL 10 MG/ML IJ SOLN
INTRAMUSCULAR | Status: DC | PRN
  Administered 2013-07-18 (×4): 80 ug via INTRAVENOUS
  Administered 2013-07-18 (×2): 40 ug via INTRAVENOUS

## 2013-07-18 MED ORDER — DIPHENHYDRAMINE HCL 50 MG/ML IJ SOLN: 25.0000 mg | INTRAMUSCULAR | Status: DC | PRN

## 2013-07-18 MED ORDER — MEPERIDINE HCL 25 MG/ML IJ SOLN: 6.2500 mg | INTRAMUSCULAR | Status: DC | PRN

## 2013-07-18 MED ORDER — CEFAZOLIN SODIUM-DEXTROSE 2-3 GM-% IV SOLR
INTRAVENOUS | Status: DC | PRN
  Administered 2013-07-18: 2 g via INTRAVENOUS

## 2013-07-18 MED ORDER — LIDOCAINE HCL (PF) 1 % IJ SOLN
INTRAMUSCULAR | Status: DC | PRN
  Administered 2013-07-18 (×4): 4 mL

## 2013-07-18 MED ORDER — SODIUM CHLORIDE 0.9 % IR SOLN
Status: DC | PRN
  Administered 2013-07-18: 1000 mL

## 2013-07-18 MED ORDER — MENTHOL 3 MG MT LOZG: 1.0000 | LOZENGE | OROMUCOSAL | Status: DC | PRN

## 2013-07-18 MED ORDER — PHENYLEPHRINE 40 MCG/ML (10ML) SYRINGE FOR IV PUSH (FOR BLOOD PRESSURE SUPPORT): 80.0000 ug | PREFILLED_SYRINGE | INTRAVENOUS | Status: DC | PRN

## 2013-07-18 MED ORDER — KETOROLAC TROMETHAMINE 60 MG/2ML IM SOLN
INTRAMUSCULAR | Status: AC
  Administered 2013-07-18: 60 mg via INTRAMUSCULAR
  Filled 2013-07-18: qty 2

## 2013-07-18 MED ORDER — SIMETHICONE 80 MG PO CHEW
80.0000 mg | CHEWABLE_TABLET | ORAL | Status: DC
  Administered 2013-07-19 – 2013-07-20 (×3): 80 mg via ORAL
  Filled 2013-07-18 (×3): qty 1

## 2013-07-18 MED ORDER — FENTANYL CITRATE 0.05 MG/ML IJ SOLN
INTRAMUSCULAR | Status: AC
  Filled 2013-07-18: qty 2

## 2013-07-18 MED ORDER — SODIUM BICARBONATE 8.4 % IV SOLN
INTRAVENOUS | Status: DC | PRN
  Administered 2013-07-18 (×3): 5 mL via EPIDURAL

## 2013-07-18 MED ORDER — ONDANSETRON HCL 4 MG/2ML IJ SOLN
INTRAMUSCULAR | Status: AC
  Filled 2013-07-18: qty 2

## 2013-07-18 MED ORDER — NALBUPHINE HCL 10 MG/ML IJ SOLN
5.0000 mg | INTRAMUSCULAR | Status: DC | PRN
  Administered 2013-07-18: 10 mg via INTRAVENOUS
  Filled 2013-07-18: qty 1

## 2013-07-18 MED ORDER — LACTATED RINGERS IV SOLN: 500.0000 mL | Freq: Once | INTRAVENOUS | Status: DC

## 2013-07-18 MED ORDER — DIBUCAINE 1 % RE OINT: 1.0000 "application " | TOPICAL_OINTMENT | RECTAL | Status: DC | PRN

## 2013-07-18 MED ORDER — OXYTOCIN 10 UNIT/ML IJ SOLN
INTRAMUSCULAR | Status: AC
  Filled 2013-07-18: qty 4

## 2013-07-18 MED ORDER — SODIUM CHLORIDE 0.9 % IJ SOLN: 3.0000 mL | INTRAMUSCULAR | Status: DC | PRN

## 2013-07-18 MED ORDER — EPHEDRINE 5 MG/ML INJ
10.0000 mg | INTRAVENOUS | Status: DC | PRN
  Filled 2013-07-18: qty 4

## 2013-07-18 MED ORDER — TERBUTALINE SULFATE 1 MG/ML IJ SOLN: 0.2500 mg | Freq: Once | INTRAMUSCULAR | Status: DC

## 2013-07-18 MED ORDER — DIPHENHYDRAMINE HCL 50 MG/ML IJ SOLN: 12.5000 mg | INTRAMUSCULAR | Status: DC | PRN

## 2013-07-18 MED ORDER — PRENATAL MULTIVITAMIN CH
1.0000 | ORAL_TABLET | Freq: Every day | ORAL | Status: DC
  Administered 2013-07-19 – 2013-07-21 (×3): 1 via ORAL
  Filled 2013-07-18 (×3): qty 1

## 2013-07-18 MED ORDER — MEPERIDINE HCL 25 MG/ML IJ SOLN
INTRAMUSCULAR | Status: AC
  Filled 2013-07-18: qty 1

## 2013-07-18 MED ORDER — LACTATED RINGERS IV SOLN
INTRAVENOUS | Status: DC | PRN
  Administered 2013-07-18 (×2): via INTRAVENOUS

## 2013-07-18 MED ORDER — NALBUPHINE HCL 10 MG/ML IJ SOLN
INTRAMUSCULAR | Status: AC
  Administered 2013-07-19: 10 mg via SUBCUTANEOUS
  Filled 2013-07-18: qty 1

## 2013-07-18 MED ORDER — NALOXONE HCL 1 MG/ML IJ SOLN
1.0000 ug/kg/h | INTRAVENOUS | Status: DC | PRN
  Filled 2013-07-18: qty 2

## 2013-07-18 MED ORDER — ONDANSETRON HCL 4 MG/2ML IJ SOLN
4.0000 mg | Freq: Three times a day (TID) | INTRAMUSCULAR | Status: DC | PRN
  Administered 2013-07-18: 4 mg via INTRAVENOUS
  Filled 2013-07-18: qty 2

## 2013-07-18 SURGICAL SUPPLY — 40 items
CLAMP CORD UMBIL (MISCELLANEOUS) IMPLANT
CLOTH BEACON ORANGE TIMEOUT ST (SAFETY) ×3 IMPLANT
CONTAINER PREFILL 10% NBF 15ML (MISCELLANEOUS) IMPLANT
DRAIN JACKSON PRT FLT 7MM (DRAIN) IMPLANT
DRAPE LG THREE QUARTER DISP (DRAPES) IMPLANT
DRSG OPSITE POSTOP 4X10 (GAUZE/BANDAGES/DRESSINGS) ×3 IMPLANT
DURAPREP 26ML APPLICATOR (WOUND CARE) ×3 IMPLANT
ELECT REM PT RETURN 9FT ADLT (ELECTROSURGICAL) ×3
ELECTRODE REM PT RTRN 9FT ADLT (ELECTROSURGICAL) ×1 IMPLANT
EVACUATOR SILICONE 100CC (DRAIN) IMPLANT
EXTRACTOR VACUUM M CUP 4 TUBE (SUCTIONS) IMPLANT
EXTRACTOR VACUUM M CUP 4' TUBE (SUCTIONS)
GLOVE BIOGEL PI IND STRL 8.5 (GLOVE) ×1 IMPLANT
GLOVE BIOGEL PI INDICATOR 8.5 (GLOVE) ×2
GLOVE ECLIPSE 8.0 STRL XLNG CF (GLOVE) ×6 IMPLANT
GOWN STRL REUS W/TWL LRG LVL3 (GOWN DISPOSABLE) ×9 IMPLANT
KIT ABG SYR 3ML LUER SLIP (SYRINGE) IMPLANT
NEEDLE HYPO 25X1 1.5 SAFETY (NEEDLE) ×3 IMPLANT
NEEDLE HYPO 25X5/8 SAFETYGLIDE (NEEDLE) IMPLANT
PACK C SECTION WH (CUSTOM PROCEDURE TRAY) ×3 IMPLANT
PAD ABD 7.5X8 STRL (GAUZE/BANDAGES/DRESSINGS) ×3 IMPLANT
PAD OB MATERNITY 4.3X12.25 (PERSONAL CARE ITEMS) ×3 IMPLANT
RINGERS IRRIG 1000ML POUR BTL (IV SOLUTION) ×3 IMPLANT
SPONGE GAUZE 4X4 12PLY (GAUZE/BANDAGES/DRESSINGS) ×3 IMPLANT
STAPLER VISISTAT 35W (STAPLE) IMPLANT
SUT MNCRL AB 3-0 PS2 27 (SUTURE) IMPLANT
SUT PLAIN 0 NONE (SUTURE) IMPLANT
SUT SILK 3 0 FS 1X18 (SUTURE) IMPLANT
SUT VIC AB 0 CT1 27 (SUTURE) ×4
SUT VIC AB 0 CT1 27XBRD ANBCTR (SUTURE) ×2 IMPLANT
SUT VIC AB 2-0 CTX 36 (SUTURE) ×6 IMPLANT
SUT VIC AB 3-0 CT1 27 (SUTURE)
SUT VIC AB 3-0 CT1 TAPERPNT 27 (SUTURE) IMPLANT
SUT VIC AB 3-0 SH 27 (SUTURE)
SUT VIC AB 3-0 SH 27X BRD (SUTURE) IMPLANT
SYR CONTROL 10ML LL (SYRINGE) ×3 IMPLANT
TAPE CLOTH SURG 4X10 WHT LF (GAUZE/BANDAGES/DRESSINGS) ×3 IMPLANT
TOWEL OR 17X24 6PK STRL BLUE (TOWEL DISPOSABLE) ×3 IMPLANT
TRAY FOLEY CATH 14FR (SET/KITS/TRAYS/PACK) ×3 IMPLANT
WATER STERILE IRR 1000ML POUR (IV SOLUTION) ×3 IMPLANT

## 2013-07-18 NOTE — Lactation Note (Signed)
This note was copied from the chart of Teresa Brennan. Lactation Consultation Note  Patient Name: Teresa Brennan [id number] Date: 07/18/2013 Reason for consult: Initial assessment of this mom and baby at 6 hours postpartum. Patient is not feeling well at time of [place] visit.  FOB assisting with baby's care and LC provided [facility] brochure and reviewed information with him, but complete "initial visit" will be needed when mom is feeling better.   LC provided [employer] [facility] brochure and reviewed [facility] services and list of community and web site resources.     Maternal Data Formula Feeding for Exclusion: No Infant to breast within first hour of birth: No Breastfeeding delayed due to:: Maternal status Has patient been taught Hand Expression?:  (not yet documented; mom not feeling well at time of [place] visit) Does the patient have breastfeeding experience prior to this delivery?: No  Feeding Feeding Type: Breast Milk  LATCH Score/Interventions Latch: Repeated attempts needed to sustain latch, nipple held in mouth throughout feeding, stimulation needed to elicit sucking reflex. Intervention(s): Adjust position;Assist with latch;Breast compression  Audible Swallowing: None  Type of Nipple: Everted at rest and after stimulation  Comfort (Breast/Nipple): Soft / non-tender     Hold (Positioning): No assistance needed to correctly position infant at breast.  LATCH Score: 7 (previous feeding assessment, per RN)  Lactation Tools Discussed/Used    [facility] Resource information provided to FOB  Consult Status Consult Status: Follow-up Date: 07/19/13 Follow-up type: In-patient    Junious Dresser [facility] 07/18/2013, 7:34 PM

## 2013-07-18 NOTE — Transfer of Care (Signed)
Immediate Anesthesia Transfer of Care Note  Patient: Teresa Brennan  Procedure(s) Performed: Procedure(s): CESAREAN SECTION (N/A)  Patient Location: PACU  Anesthesia Type:Epidural  Level of Consciousness: awake, alert  and oriented  Airway & Oxygen Therapy: Patient Spontanous Breathing  Post-op Assessment: Report given to PACU RN and Post -op Vital signs reviewed and stable  Post vital signs: Reviewed and stable  Complications: No apparent anesthesia complications

## 2013-07-18 NOTE — Consult Note (Signed)
[facility] [place]  Delivery Note:  C-section       07/18/2013  12:53 PM  I was called to the operating room at the request of the patient's obstetrician (Dr. Raphael Gibney) due to nonreassuring fetal heart tones in the setting of labor.  PRENATAL HX:  28 y/o G1P0 at 45 and 1/[redacted] weeks gestation admitted yesterday for IOL.  Her pregnancy has been complicated by a uterine fibroid and she is GBS +.  She received adequate penicillin treatment prior to delivery.  During labor the fetus had heart rate decelerations and was then delivered by a stat c-section when fetal HR declined to 70 bpm then was undetectable.  At delivery, a nuchal cord was noted.    DELIVERY:   Infant was vigorous at delivery, requiring no resuscitation other than standard warming, drying and stimulation.  APGARs 8 and 9.  Exam notable for a cut along the right ear but is otherwise within normal limits.  After 5 minutes, baby left with nurse to assist parents with skin-to-skin care.   _____________________ Electronically Signed By: Clinton Gallant, MD Neonatology

## 2013-07-18 NOTE — Progress Notes (Signed)
cx 2/60/0, vertex FHT: Cat 2 Terb given again. AROM: Clear. IUPC placed. Begin amnioinfusion. CS discussed. R and B reviewed.  Dr. Raphael Gibney

## 2013-07-18 NOTE — Anesthesia Postprocedure Evaluation (Signed)
  Anesthesia Post-op Note  Anesthesia Post Note  Patient: Teresa Brennan  Procedure(s) Performed: Procedure(s) (LRB): CESAREAN SECTION (N/A)  Anesthesia type: Epidural  Patient location: PACU  Post pain: Pain level controlled  Post assessment: Post-op Vital signs reviewed  Last Vitals:  Filed Vitals:   07/18/13 1515  BP:   Pulse:   Temp:   Resp: 22    Post vital signs: stable  Level of consciousness: awake  Complications: No apparent anesthesia complications

## 2013-07-18 NOTE — Anesthesia Preprocedure Evaluation (Addendum)
Anesthesia Evaluation  Patient identified by MRN, date of birth, ID band Patient awake    Reviewed: Allergy & Precautions, H&P , NPO status , Patient's Chart, lab work & pertinent test results, reviewed documented beta blocker date and time   History of Anesthesia Complications Negative for: history of anesthetic complications  Airway Mallampati: III TM Distance: >3 FB Neck ROM: full    Dental  (+) Teeth Intact   Pulmonary asthma ,  breath sounds clear to auscultation        Cardiovascular negative cardio ROS  Rhythm:regular Rate:Normal     Neuro/Psych  Headaches, negative psych ROS   GI/Hepatic negative GI ROS, Neg liver ROS,   Endo/Other  negative endocrine ROS  Renal/GU negative Renal ROS  Female GU complaint     Musculoskeletal   Abdominal   Peds  Hematology  (+) Blood dyscrasia (pernicious anemia), anemia ,   Anesthesia Other Findings   Reproductive/Obstetrics (+) Pregnancy (fetal bradycardia --> STAT C/S)                          Anesthesia Physical Anesthesia Plan  ASA: II and emergent  Anesthesia Plan: Epidural   Post-op Pain Management:    Induction:   Airway Management Planned:   Additional Equipment:   Intra-op Plan:   Post-operative Plan:   Informed Consent: I have reviewed the patients History and Physical, chart, labs and discussed the procedure including the risks, benefits and alternatives for the proposed anesthesia with the patient or authorized representative who has indicated his/her understanding and acceptance.     Plan Discussed with: Surgeon and CRNA  Anesthesia Plan Comments:        Anesthesia Quick Evaluation

## 2013-07-18 NOTE — Progress Notes (Addendum)
  Subjective: Pt is more uncomfortable with UCs.  Asking for IV pain medication.  FOB at bedside and supportive.   Objective: BP 122/84  Pulse 81  Temp(Src) 98.2 F (36.8 C) (Oral)  Resp 18  Ht 5\' 6"  (1.676 m)  Wt 176 lb (79.833 kg)  BMI 28.42 kg/m2  LMP 10/03/2012      FHT: Category II - bolus, position change UC:   regular, every 1.5-2.5 minutes  SVE:   Dilation: 1 Effacement (%): 50 Station: -2 Exam by:: Oxley CNM   Induction of labor due to Post term,  2nd dose of Cytotec at [employer]  Labor: UCs Q 1.5-2.5 min, will hold 3rd dose of Cytotec at this time Preeclampsia: BPs WNL  Fetal Wellbeing: Category II  Pain Control: Fentanyl  I/D: GBS pos; PCN; Intact; Afebrile    Anticipated MOD: NSVD    OXLEY, JENNIFER 04/26/2013, 7:23 AM

## 2013-07-18 NOTE — Op Note (Signed)
OPERATIVE NOTE  Patient's Name: Teresa Brennan  Date of Birth: 1985/03/07   Medical Records Number: [id number]   Date of Operation: 07/18/2013   Preoperative diagnosis:  [redacted]w[redacted]d weeks gestation  primary cesarean section for fetal intolerance to labor  Anemia  Postoperative diagnosis:  [redacted]w[redacted]d weeks gestation  primary cesarean section for fetal intolerance to labor  Anemia  Nuchal cord  Right ovarian solid mass  Procedure:  Stat Low Transverse Cesarean Section  Removal of right ovarian mass  Surgeon:  Gildardo Cranker, M.D.  Assistant:  Farrel Gordon, certified nurse midwife  Anesthesia:  Epidural  Disposition:  Teresa Brennan is a 28 y.o. female who presents at [redacted]w[redacted]d weeks gestation. The patient has been followed at the [facility]. The patient was admitted for induction of labor. The patient was noted to have a category 2 fetal heart rate tracing. An intrauterine pressure catheter was placed and an amnioinfusion was performed. She was given terbutaline. The patient continued to have decelerations that were concerning in spite of our resuscitation efforts. The decision was made to proceed with a stat cesarean section. Fetal heart tones were noted to be 70 beats per minute in the operating room. She understands the indications for her procedure and she accepts the risk of, but not limited to, anesthetic complications, bleeding, infections, and possible damage to the surrounding organs.  Findings:  A  female (Teresa Brennan) was delivered from a occiput anterior position.  The Apgar scores were 8/9. A nuchal cord was present. The uterus, fallopian tubes, and left ovary was normal for the gravid state. The right ovary contained a 5 cm solid mass of uncertain etiology.  Procedure:  The patient was taken to the operating room where it was determined that the epidural she had for her labor would  be adequate for cesarean delivery. The patient's abdomen was prepped with Betadine.  A Foley catheter was previously placed in the bladder. The patient was sterilely draped. A low transverse incision was made in the abdomen and carried sharply through the subcutaneous tissue, the fascia, and the anterior peritoneum. An incision was made in the lower uterine segment. The incision was extended in a low transverse fashion. The membranes were ruptured. The fetal head was delivered without difficulty. The mouth and nose were suctioned. The remainder of the infant was then delivered. The cord was clamped and cut. The infant was handed to the awaiting pediatric team. The placenta was removed. The uterine cavity was cleaned of amniotic fluid, clotted blood, and membranes. The uterine incision was closed using a running locking suture of 2-0 Vicryl. An imbricating suture of 2-0 Vicryl was placed. The pelvis was vigorously irrigated. Hemostasis was adequate. A 5 cm solid mass was noted in the right ovary. An incision was made in the capsule of the ovary. Sharp and blunt dissection were used to remove the solid mass completely. The base of the ovary and then the capsule of the ovary were closed using 4-0 Monocryl. Hemostasis was adequate. The pelvis was irrigated. The anterior peritoneum and the abdominal musculature were closed using 2-0 Vicryl. The fascia was closed using a running suture of 0 Vicryl followed by 3 interrupted sutures of 0 Vicryl. The subcutaneous layer was closed using interrupted sutures of 2-0 Vicryl. The skin was reapproximated using a subcuticular suture of 3-0 Monocryl. Sponge, needle, and instrument counts were correct on 2 occasions. The estimated blood loss for the procedure was 1000 cc's. The  patient tolerated her procedure well. She was transported to the recovery room in stable condition. The infant was taken to the full-term nursery in stable condition. The placenta and the mass in the right  ovary were sent to pathology.  Gildardo Cranker, M.D.

## 2013-07-18 NOTE — Anesthesia Procedure Notes (Signed)
Epidural Patient location during procedure: OB Start time: 07/18/2013 11:08 AM  Staffing Performed by: anesthesiologist   Preanesthetic Checklist Completed: patient identified, site marked, surgical consent, pre-op evaluation, timeout performed, IV checked, risks and benefits discussed and monitors and equipment checked  Epidural Patient position: sitting Prep: site prepped and draped and DuraPrep Patient monitoring: continuous pulse ox and blood pressure Approach: midline Injection technique: LOR air  Needle:  Needle type: Tuohy  Needle gauge: 17 G Needle length: 9 cm and 9 Needle insertion depth: 6 cm Catheter type: closed end flexible Catheter size: 19 Gauge Catheter at skin depth: 11 cm Test dose: negative  Assessment Events: blood not aspirated, injection not painful, no injection resistance, negative IV test and no paresthesia  Additional Notes Discussed risk of headache, infection, bleeding, nerve injury and failed or incomplete block.  Patient voices understanding and wishes to proceed.  Epidural placed on first attempt with mild difficulty due to positioning. No paresthesia.  Patient tolerated procedure well with no apparent complications.  Charlton Haws, MDReason for block:procedure for pain

## 2013-07-18 NOTE — Progress Notes (Signed)
Tachysytole noted with Cat II tracing - terbutaline given

## 2013-07-18 NOTE — Progress Notes (Signed)
  Subjective: Pt is comfortable just noting minor cramping.  Ambien just given, pt trying to sleep.  2nd dose of Cytotec placed.  Family at bedside and supportive.  Objective: BP 101/51  Pulse 79  Temp(Src) 97.9 F (36.6 C) (Oral)  Resp 18  Ht 5\' 6"  (1.676 m)  Wt 176 lb (79.833 kg)  BMI 28.42 kg/m2  LMP 10/03/2012      FHT: Category I UC:   irregular, mild  SVE:   Deferred   Induction of labor due to postterm,  2nd dose of Cytotec at 0101  Labor: Progressing normally  Preeclampsia: BPs WNL  Fetal Wellbeing: Category I  Pain Control: Labor support without medications  I/D: GBS pos; PCN; Intact; Afebrile  Anticipated MOD: NSVD    OXLEY, JENNIFER 04/26/2013, 7:23 AM

## 2013-07-19 LAB — CBC
HCT: 23.5 % — ABNORMAL LOW (ref 36.0–46.0)
Hemoglobin: 7.7 g/dL — ABNORMAL LOW (ref 12.0–15.0)
MCH: 24.4 pg — ABNORMAL LOW (ref 26.0–34.0)
MCHC: 32.8 g/dL (ref 30.0–36.0)
MCV: 74.4 fL — ABNORMAL LOW (ref 78.0–100.0)
Platelets: 205 10*3/uL (ref 150–400)
RBC: 3.16 MIL/uL — ABNORMAL LOW (ref 3.87–5.11)
RDW: 14.6 % (ref 11.5–15.5)
WBC: 11.5 10*3/uL — ABNORMAL HIGH (ref 4.0–10.5)

## 2013-07-19 MED ORDER — OXYCODONE-ACETAMINOPHEN 5-325 MG PO TABS
1.0000 | ORAL_TABLET | ORAL | Status: DC | PRN
  Administered 2013-07-19 – 2013-07-20 (×4): 1 via ORAL
  Filled 2013-07-19 (×4): qty 1

## 2013-07-19 NOTE — Lactation Note (Signed)
This note was copied from the chart of Teresa Brennan. Lactation Consultation Note  Follow up consult:  Difficulty latching. Mother has some swelling around areola but recently was able to latch with assistance from [facility]. Reviewed breast massage and hand expression with teachback. Able to express small drops of colostrum on both breasts. Setup DEBP to stimulate mother's milk supply.  Reviewed cleaning, milk storage, foley cup and syringe. Encouraged mother to post pump 4-6 times a day during waking hours and give baby back what is pumped. Suggested mother call if she needs further assistance with bf.   Patient Name: Teresa Brennan KPTWS'F Date: 07/19/2013 Reason for consult: Follow-up assessment   Maternal Data    Feeding Feeding Type: Breast Fed Length of feed: 8 min  LATCH Score/Interventions Latch: Grasps breast easily, tongue down, lips flanged, rhythmical sucking. Intervention(s): Skin to skin;Teach feeding cues;Waking techniques Intervention(s): Adjust position;Assist with latch;Breast massage;Breast compression  Audible Swallowing: None Intervention(s): Skin to skin;Hand expression Intervention(s): Hand expression;Skin to skin;Alternate breast massage  Type of Nipple: Everted at rest and after stimulation  Comfort (Breast/Nipple): Soft / non-tender     Hold (Positioning): Assistance needed to correctly position infant at breast and maintain latch. Intervention(s): Breastfeeding basics reviewed;Support Pillows;Skin to skin  LATCH Score: 7  Lactation Tools Discussed/Used     Consult Status Consult Status: Follow-up Date: 07/20/13 Follow-up type: In-patient    Vivianne Master [facility] 07/19/2013, 5:15 PM

## 2013-07-19 NOTE — Addendum Note (Signed)
Addendum created 07/19/13 0933 by Lenox Ponds, CRNA   Modules edited: Notes Section   Notes Section:  File: [id number]

## 2013-07-19 NOTE — Progress Notes (Signed)
Subjective: Postpartum Day 1: Cesarean Delivery due to NRFHRT. Right ovarian mass removed during c-section. Patient up ad lib, reports no syncope or dizziness. Feeling much better today; no N/V, pain well controlled. +Flatus, no BM. Feeding:  Trying to breastfeed. Waiting to see lactation consultant. Contraceptive plan: Nor-Q-D.  Objective: Vital signs in last 24 hours: Temp:  [97.4 F (36.3 C)-98.6 F (37 C)] 98.6 F (37 C) (06/27 0958) Pulse Rate:  [72-114] 83 (06/27 1110) Resp:  [17-26] 17 (06/27 0958) BP: (94-128)/(53-82) 120/81 mmHg (06/27 1129) SpO2:  [98 %-100 %] 100 % (06/27 0540)  Physical Exam:  General: alert Lungs: CTAB CV: RRR Lochia: appropriate Uterine Fundus: firm Abdomen:  + bowel sounds, NTND Incision: no significant drainage, honeycomb dressing in place and c/d/i DVT Evaluation: No evidence of DVT seen on physical exam. Negative Homan's sign.    Recent Labs  07/17/13 2010 07/19/13 0648  HGB 10.0* 7.7*  HCT 30.2* 23.5*  WBC 11.2* 11.5*    Assessment/Plan: Status post Cesarean section day 1. Doing well postoperatively.  Anemia - hemodynamically stable. Continue current care. Anticipate d/c in 24-48 hrs.    Farrel Gordon CNM, MS 07/19/2013, 2:37 PM

## 2013-07-19 NOTE — Anesthesia Postprocedure Evaluation (Signed)
Anesthesia Post Note  Patient: Teresa Brennan  Procedure(s) Performed: Procedure(s) (LRB): CESAREAN SECTION (N/A)  Anesthesia type: Epidural  Patient location: Mother/Baby  Post pain: Pain level controlled  Post assessment: Post-op Vital signs reviewed  Last Vitals:  Filed Vitals:   07/19/13 0540  BP: 102/72  Pulse: 76  Temp: 36.6 C  Resp: 18    Post vital signs: Reviewed  Level of consciousness:alert  Complications: No apparent anesthesia complications

## 2013-07-20 DIAGNOSIS — Z98891 History of uterine scar from previous surgery: Secondary | ICD-10-CM

## 2013-07-20 LAB — CBC
HEMATOCRIT: 23.7 % — AB (ref 36.0–46.0)
Hemoglobin: 7.7 g/dL — ABNORMAL LOW (ref 12.0–15.0)
MCH: 24.4 pg — ABNORMAL LOW (ref 26.0–34.0)
MCHC: 32.5 g/dL (ref 30.0–36.0)
MCV: 75.2 fL — AB (ref 78.0–100.0)
PLATELETS: 201 10*3/uL (ref 150–400)
RBC: 3.15 MIL/uL — ABNORMAL LOW (ref 3.87–5.11)
RDW: 14.9 % (ref 11.5–15.5)
WBC: 10.4 10*3/uL (ref 4.0–10.5)

## 2013-07-20 MED ORDER — FERROUS SULFATE 325 (65 FE) MG PO TABS
325.0000 mg | ORAL_TABLET | Freq: Two times a day (BID) | ORAL | Status: DC
  Administered 2013-07-21: 325 mg via ORAL
  Filled 2013-07-20: qty 1

## 2013-07-20 NOTE — Progress Notes (Signed)
Subjective:  Postpartum Day 2: Cesarean Delivery  Patient reports incisional pain, tolerating PO and no problems voiding.    Objective: Vital signs in last 24 hours: Temp:  [98.2 F (36.8 C)-98.6 F (37 C)] 98.2 F (36.8 C) (06/27 1810) Pulse Rate:  [83-93] 93 (06/27 1810) Resp:  [16-18] 18 (06/27 1810) BP: (96-121)/(63-81) 121/73 mmHg (06/27 1810)  Physical Exam:  General: no distress Lochia: appropriate Uterine Fundus: firm Incision: healing well DVT Evaluation: No evidence of DVT seen on physical exam.   Recent Labs  07/17/13 2010 07/19/13 0648  HGB 10.0* 7.7*  HCT 30.2* 23.5*    Assessment/Plan: Status post Cesarean section. Doing well postoperatively.  Continue current care.  STRINGER,ARTHUR V 07/20/2013, 10:07 AM

## 2013-07-20 NOTE — Progress Notes (Addendum)
In to see patient--doing well. Up without syncope or dizziness. Hx anemia, requiring blood transfusion in past--"I remember how I felt before transfusion, don't feel that way now". Declines transfusion today.  Plan: Check orthostatics CBC today Fe BID.  Donnel Saxon, CNM 07/20/13 9a

## 2013-07-21 ENCOUNTER — Encounter (HOSPITAL_COMMUNITY): Payer: Self-pay | Admitting: Obstetrics and Gynecology

## 2013-07-21 MED ORDER — NORETHINDRONE 0.35 MG PO TABS
1.0000 | ORAL_TABLET | Freq: Every day | ORAL | Status: DC
Start: 2013-07-21 — End: 2014-10-21

## 2013-07-21 MED ORDER — OXYCODONE-ACETAMINOPHEN 5-325 MG PO TABS: 1.0000 | ORAL_TABLET | ORAL | Status: DC | PRN

## 2013-07-21 MED ORDER — DOCUSATE SODIUM 100 MG PO CAPS
100.0000 mg | ORAL_CAPSULE | Freq: Two times a day (BID) | ORAL | Status: DC
Start: 2013-07-21 — End: 2014-02-01

## 2013-07-21 MED ORDER — IBUPROFEN 600 MG PO TABS: 600.0000 mg | ORAL_TABLET | Freq: Four times a day (QID) | ORAL | Status: DC | PRN

## 2013-07-21 MED ORDER — FERROUS SULFATE 325 (65 FE) MG PO TABS: 325.0000 mg | ORAL_TABLET | Freq: Two times a day (BID) | ORAL | Status: DC

## 2013-07-21 MED ORDER — SIMETHICONE 80 MG PO CHEW: 80.0000 mg | CHEWABLE_TABLET | Freq: Three times a day (TID) | ORAL | Status: DC

## 2013-07-21 NOTE — Discharge Instructions (Addendum)
Anemia, Nonspecific Anemia is a condition in which the concentration of red blood cells or hemoglobin in the blood is below normal. Hemoglobin is a substance in red blood cells that carries oxygen to the tissues of the body. Anemia results in not enough oxygen reaching these tissues.  CAUSES  Common causes of anemia include:   Excessive bleeding. Bleeding may be internal or external. This includes excessive bleeding from periods (in women) or from the intestine.   Poor nutrition.   Chronic kidney, thyroid, and liver disease.  Bone marrow disorders that decrease red blood cell production.  Cancer and treatments for cancer.  HIV, AIDS, and their treatments.  Spleen problems that increase red blood cell destruction.  Blood disorders.  Excess destruction of red blood cells due to infection, medicines, and autoimmune disorders. SIGNS AND SYMPTOMS   Minor weakness.   Dizziness.   Headache.  Palpitations.   Shortness of breath, especially with exercise.   Paleness.  Cold sensitivity.  Indigestion.  Nausea.  Difficulty sleeping.  Difficulty concentrating. Symptoms may occur suddenly or they may develop slowly.  DIAGNOSIS  Additional blood tests are often needed. These help your health care provider determine the best treatment. Your health care provider will check your stool for blood and look for other causes of blood loss.  TREATMENT  Treatment varies depending on the cause of the anemia. Treatment can include:   Supplements of iron, vitamin D14, or folic acid.   Hormone medicines.   A blood transfusion. This may be needed if blood loss is severe.   Hospitalization. This may be needed if there is significant continual blood loss.   Dietary changes.  Spleen removal. HOME CARE INSTRUCTIONS Keep all follow-up appointments. It often takes many weeks to correct anemia, and having your health care provider check on your condition and your response to  treatment is very important. SEEK IMMEDIATE MEDICAL CARE IF:   You develop extreme weakness, shortness of breath, or chest pain.   You become dizzy or have trouble concentrating.  You develop heavy vaginal bleeding.   You develop a rash.   You have bloody or black, tarry stools.   You faint.   You vomit up blood.   You vomit repeatedly.   You have abdominal pain.  You have a fever or persistent symptoms for more than 2-3 days.   You have a fever and your symptoms suddenly get worse.   You are dehydrated.  MAKE SURE YOU:  Understand these instructions.  Will watch your condition.  Will get help right away if you are not doing well or get worse. Document Released: 02/17/2004 Document Revised: 09/11/2012 Document Reviewed: 07/05/2012 [facility] Patient Information 2015 [place], [place]. This information is not intended to replace advice given to you by your health care provider. Make sure you discuss any questions you have with your health care provider. Postpartum Care After Cesarean Delivery After you deliver your newborn (postpartum period), the usual stay in the hospital is 24-72 hours. If there were problems with your labor or delivery, or if you have other medical problems, you might be in the hospital longer.  While you are in the hospital, you will receive help and instructions on how to care for yourself and your newborn during the postpartum period.  While you are in the hospital:  It is normal for you to have pain or discomfort from the incision in your abdomen. Be sure to tell your nurses when you are having pain, where the pain is  located, and what makes the pain worse.  If you are breastfeeding, you may feel uncomfortable contractions of your uterus for a couple of weeks. This is normal. The contractions help your uterus get back to normal size.  It is normal to have some bleeding after delivery.  For the first 1-3 days after delivery, the flow is red  and the amount may be similar to a period.  It is common for the flow to start and stop.  In the first few days, you may pass some small clots. Let your nurses know if you begin to pass large clots or your flow increases.  Do not  flush blood clots down the toilet before having the nurse look at them.  During the next 3-10 days after delivery, your flow should become more watery and pink or brown-tinged in color.  Ten to fourteen days after delivery, your flow should be a small amount of yellowish-white discharge.  The amount of your flow will decrease over the first few weeks after delivery. Your flow may stop in 6-8 weeks. Most women have had their flow stop by 12 weeks after delivery.  You should change your sanitary pads frequently.  Wash your hands thoroughly with soap and water for at least 20 seconds after changing pads, using the toilet, or before holding or feeding your newborn.  Your intravenous (IV) tubing will be removed when you are drinking enough fluids.  The urine drainage tube (urinary catheter) that was inserted before delivery may be removed within 6-8 hours after delivery or when feeling returns to your legs. You should feel like you need to empty your bladder within the first 6-8 hours after the catheter has been removed.  In case you become weak, lightheaded, or faint, call your nurse before you get out of bed for the first time and before you take a shower for the first time.  Within the first few days after delivery, your breasts may begin to feel tender and full. This is called engorgement. Breast tenderness usually goes away within 48-72 hours after engorgement occurs. You may also notice milk leaking from your breasts. If you are not breastfeeding, do not stimulate your breasts. Breast stimulation can make your breasts produce more milk.  Spending as much time as possible with your newborn is very important. During this time, you and your newborn can feel close  and get to know each other. Having your newborn stay in your room (rooming in) will help to strengthen the bond with your newborn. It will give you time to get to know your newborn and become comfortable caring for your newborn.  Your hormones change after delivery. Sometimes the hormone changes can temporarily cause you to feel sad or tearful. These feelings should not last more than a few days. If these feelings last longer than that, you should talk to your caregiver.  If desired, talk to your caregiver about methods of family planning or contraception.  Talk to your caregiver about immunizations. Your caregiver may want you to have the following immunizations before leaving the hospital:  Tetanus, diphtheria, and pertussis (Tdap) or tetanus and diphtheria (Td) immunization. It is very important that you and your family (including grandparents) or others caring for your newborn are up-to-date with the Tdap or Td immunizations. The Tdap or Td immunization can help protect your newborn from getting ill.  Rubella immunization.  Varicella (chickenpox) immunization.  Influenza immunization. You should receive this annual immunization if you did not receive the  immunization during your pregnancy. Document Released: 10/04/2011 Document Reviewed: 10/04/2011 [facility] Patient Information 2015 [place]. This information is not intended to replace advice given to you by your health care provider. Make sure you discuss any questions you have with your health care provider. Contraception Choices Contraception (birth control) is the use of any methods or devices to prevent pregnancy. Below are some methods to help avoid pregnancy. HORMONAL METHODS   Contraceptive implant. This is a thin, plastic tube containing progesterone hormone. It does not contain estrogen hormone. Your health care provider inserts the tube in the inner part of the upper arm. The tube can remain in place for up to 3 years. After  3 years, the implant must be removed. The implant prevents the ovaries from releasing an egg (ovulation), thickens the cervical mucus to prevent sperm from entering the uterus, and thins the lining of the inside of the uterus.  Progesterone-only injections. These injections are given every 3 months by your health care provider to prevent pregnancy. This synthetic progesterone hormone stops the ovaries from releasing eggs. It also thickens cervical mucus and changes the uterine lining. This makes it harder for sperm to survive in the uterus.  Birth control pills. These pills contain estrogen and progesterone hormone. They work by preventing the ovaries from releasing eggs (ovulation). They also cause the cervical mucus to thicken, preventing the sperm from entering the uterus. Birth control pills are prescribed by a health care provider.Birth control pills can also be used to treat heavy periods.  Minipill. This type of birth control pill contains only the progesterone hormone. They are taken every day of each month and must be prescribed by your health care provider.  Birth control patch. The patch contains hormones similar to those in birth control pills. It must be changed once a week and is prescribed by a health care provider.  Vaginal ring. The ring contains hormones similar to those in birth control pills. It is left in the vagina for 3 weeks, removed for 1 week, and then a new one is put back in place. The patient must be comfortable inserting and removing the ring from the vagina.A health care provider's prescription is necessary.  Emergency contraception. Emergency contraceptives prevent pregnancy after unprotected sexual intercourse. This pill can be taken right after sex or up to 5 days after unprotected sex. It is most effective the sooner you take the pills after having sexual intercourse. Most emergency contraceptive pills are available without a prescription. Check with your pharmacist.  Do not use emergency contraception as your only form of birth control. BARRIER METHODS   Female condom. This is a thin sheath (latex or rubber) that is worn over the penis during sexual intercourse. It can be used with spermicide to increase effectiveness.  Female condom. This is a soft, loose-fitting sheath that is put into the vagina before sexual intercourse.  Diaphragm. This is a soft, latex, dome-shaped barrier that must be fitted by a health care provider. It is inserted into the vagina, along with a spermicidal jelly. It is inserted before intercourse. The diaphragm should be left in the vagina for 6 to 8 hours after intercourse.  Cervical cap. This is a round, soft, latex or plastic cup that fits over the cervix and must be fitted by a health care provider. The cap can be left in place for up to 48 hours after intercourse.  Sponge. This is a soft, circular piece of polyurethane foam. The sponge has spermicide in it.  It is inserted into the vagina after wetting it and before sexual intercourse.  Spermicides. These are chemicals that kill or block sperm from entering the cervix and uterus. They come in the form of creams, jellies, suppositories, foam, or tablets. They do not require a prescription. They are inserted into the vagina with an applicator before having sexual intercourse. The process must be repeated every time you have sexual intercourse. INTRAUTERINE CONTRACEPTION  Intrauterine device (IUD). This is a T-shaped device that is put in a woman's uterus during a menstrual period to prevent pregnancy. There are 2 types:  Copper IUD. This type of IUD is wrapped in copper wire and is placed inside the uterus. Copper makes the uterus and fallopian tubes produce a fluid that kills sperm. It can stay in place for 10 years.  Hormone IUD. This type of IUD contains the hormone progestin (synthetic progesterone). The hormone thickens the cervical mucus and prevents sperm from entering the  uterus, and it also thins the uterine lining to prevent implantation of a fertilized egg. The hormone can weaken or kill the sperm that get into the uterus. It can stay in place for 3-5 years, depending on which type of IUD is used. PERMANENT METHODS OF CONTRACEPTION  Female tubal ligation. This is when the woman's fallopian tubes are surgically sealed, tied, or blocked to prevent the egg from traveling to the uterus.  Hysteroscopic sterilization. This involves placing a small coil or insert into each fallopian tube. Your doctor uses a technique called hysteroscopy to do the procedure. The device causes scar tissue to form. This results in permanent blockage of the fallopian tubes, so the sperm cannot fertilize the egg. It takes about 3 months after the procedure for the tubes to become blocked. You must use another form of birth control for these 3 months.  Female sterilization. This is when the female has the tubes that carry sperm tied off (vasectomy).This blocks sperm from entering the vagina during sexual intercourse. After the procedure, the man can still ejaculate fluid (semen). NATURAL PLANNING METHODS  Natural family planning. This is not having sexual intercourse or using a barrier method (condom, diaphragm, cervical cap) on days the woman could become pregnant.  Calendar method. This is keeping track of the length of each menstrual cycle and identifying when you are fertile.  Ovulation method. This is avoiding sexual intercourse during ovulation.  Symptothermal method. This is avoiding sexual intercourse during ovulation, using a thermometer and ovulation symptoms.  Post-ovulation method. This is timing sexual intercourse after you have ovulated. Regardless of which type or method of contraception you choose, it is important that you use condoms to protect against the transmission of sexually transmitted infections (STIs). Talk with your health care provider about which form of  contraception is most appropriate for you. Document Released: 01/09/2005 Document Revised: 01/14/2013 Document Reviewed: 07/04/2012 [facility] Patient Information 2015 [place], [place]. This information is not intended to replace advice given to you by your health care provider. Make sure you discuss any questions you have with your health care provider. Postpartum Depression and Baby Blues The postpartum period begins right after the birth of a baby. During this time, there is often a great amount of joy and excitement. It is also a time of many changes in the life of the parents. Regardless of how many times a mother gives birth, each child brings new challenges and dynamics to the family. It is not unusual to have feelings of excitement along with confusing shifts  in moods, emotions, and thoughts. All mothers are at risk of developing postpartum depression or the "baby blues." These mood changes can occur right after giving birth, or they may occur many months after giving birth. The baby blues or postpartum depression can be mild or severe. Additionally, postpartum depression can go away rather quickly, or it can be a long-term condition.  CAUSES Raised hormone levels and the rapid drop in those levels are thought to be a main cause of postpartum depression and the baby blues. A number of hormones change during and after pregnancy. Estrogen and progesterone usually decrease right after the delivery of your baby. The levels of thyroid hormone and various cortisol steroids also rapidly drop. Other factors that play a role in these mood changes include major life events and genetics.  RISK FACTORS If you have any of the following risks for the baby blues or postpartum depression, know what symptoms to watch out for during the postpartum period. Risk factors that may increase the likelihood of getting the baby blues or postpartum depression include:  Having a personal or family history of depression.    Having depression while being pregnant.   Having premenstrual mood issues or mood issues related to oral contraceptives.  Having a lot of life stress.   Having marital conflict.   Lacking a social support network.   Having a baby with special needs.   Having health problems, such as diabetes.  SIGNS AND SYMPTOMS Symptoms of baby blues include:  Brief changes in mood, such as going from extreme happiness to sadness.  Decreased concentration.   Difficulty sleeping.   Crying spells, tearfulness.   Irritability.   Anxiety.  Symptoms of postpartum depression typically begin within the first month after giving birth. These symptoms include:  Difficulty sleeping or excessive sleepiness.   Marked weight loss.   Agitation.   Feelings of worthlessness.   Lack of interest in activity or food.  Postpartum psychosis is a very serious condition and can be dangerous. Fortunately, it is rare. Displaying any of the following symptoms is cause for immediate medical attention. Symptoms of postpartum psychosis include:   Hallucinations and delusions.   Bizarre or disorganized behavior.   Confusion or disorientation.  DIAGNOSIS  A diagnosis is made by an evaluation of your symptoms. There are no medical or lab tests that lead to a diagnosis, but there are various questionnaires that a health care provider may use to identify those with the baby blues, postpartum depression, or psychosis. Often, a screening tool called the [place] Postnatal Depression Scale is used to diagnose depression in the postpartum period.  TREATMENT The baby blues usually goes away on its own in 1-2 weeks. Social support is often all that is needed. You will be encouraged to get adequate sleep and rest. Occasionally, you may be given medicines to help you sleep.  Postpartum depression requires treatment because it can last several months or longer if it is not treated. Treatment may  include individual or group therapy, medicine, or both to address any social, physiological, and psychological factors that may play a role in the depression. Regular exercise, a healthy diet, rest, and social support may also be strongly recommended.  Postpartum psychosis is more serious and needs treatment right away. Hospitalization is often needed. HOME CARE INSTRUCTIONS  Get as much rest as you can. Nap when the baby sleeps.   Exercise regularly. Some women find yoga and walking to be beneficial.   Eat a balanced and nourishing  diet.   Do little things that you enjoy. Have a cup of tea, take a bubble bath, read your favorite magazine, or listen to your favorite music.  Avoid alcohol.   Ask for help with household chores, cooking, grocery shopping, or running errands as needed. Do not try to do everything.   Talk to people close to you about how you are feeling. Get support from your partner, family members, friends, or other new moms.  Try to stay positive in how you think. Think about the things you are grateful for.   Do not spend a lot of time alone.   Only take over-the-counter or prescription medicine as directed by your health care provider.  Keep all your postpartum appointments.   Let your health care provider know if you have any concerns.  SEEK MEDICAL CARE IF: You are having a reaction to or problems with your medicine. SEEK IMMEDIATE MEDICAL CARE IF:  You have suicidal feelings.   You think you may harm the baby or someone else. MAKE SURE YOU:  Understand these instructions.  Will watch your condition.  Will get help right away if you are not doing well or get worse. Document Released: 10/14/2003 Document Revised: 01/14/2013 Document Reviewed: 10/21/2012 [facility] Patient Information 2015 [place], [place]. This information is not intended to replace advice given to you by your health care provider. Make sure you discuss any questions you have with  your health care provider.

## 2013-07-21 NOTE — Discharge Summary (Signed)
Cesarean Section Delivery Discharge Summary  Teresa Brennan  DOB:    10-Dec-1985 MRN:    [id number] CSN:    [id number]  Date of admission:                  07/17/13  Date of discharge:                   07/21/13  Procedures this admission: Primary Low-Transverse C-section, Removal of right ovarian mass  Date of Delivery:  07/18/13  Newborn Data:  Live born female  Birth Weight: 7 lb 0.5 oz (3190 g) APGAR: 8, 9  Home with mother. Name: Trinity   History of Present Illness:  Teresa Brennan is a 28 y.o. female, G1P1001, who presents at [redacted]w[redacted]d weeks gestation. The patient has been followed at the [facility] and Gynecology division of [facility].    Her pregnancy has been complicated by:  Patient Active Problem List   Diagnosis Date Noted  . Status post primary low transverse cesarean section--NRFHR 07/20/2013  . Uterine fibroid 07/17/2013  . 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 Cervical high risk HPV (human papillomavirus) test positive 07/17/2013  . Pernicious anemia 10/03/2011  . Vitamin B12 deficiency 03/10/2011  . Anemia 03/10/2011   none.  Hospital Course--Unscheduled Cesarean:  The patient was admitted on 07/17/13 for postdates IOL. Positive GBS.  Utilized Epidural for pain management. On 07/18/13 the patient was noted to have a category 2 fetal heart rate tracing. An intrauterine pressure catheter was placed and an amnioinfusion was performed. She was given terbutaline. The patient continued to have decelerations that were concerning in spite of our resuscitation efforts. She was consented for cesarean, with Dr. Raphael Gibney performing a primary LTCS under epidural anesthesia, with delivery of a viable female, with weight and Apgars as listed above. Infant was in good condition and remained at the patient's bedside.  The patient was taken to recovery in good condition.  Patient planned to breastfeed.  On  post-op day 1, patient was doing well, tolerating a regular diet, with Hgb of 6.5, however asymptomatic.  Throughout her stay, her physical exam was WNL, her incision was CDI, and her vital signs remained stable.  By post-op day 3, she was up ad lib, tolerating a regular diet, with good pain control with po med.  She was deemed to have received the full benefit of her hospital stay, and was discharged home in stable condition.  Contraceptive choice was Micronor.    Feeding:  breast  Contraception:  oral progesterone-only contraceptive  Discharge hemoglobin:  HGB  Date Value Ref Range Status  03/19/2013 11.2* 11.6 - 15.9 g/dL Final  10/16/2012 12.6  11.6 - 15.9 g/dL Final  01/03/2012 14.0  11.6 - 15.9 g/dL Final     Hemoglobin  Date Value Ref Range Status  07/20/2013 7.7* 12.0 - 15.0 g/dL Final  07/19/2013 7.7* 12.0 - 15.0 g/dL Final     REPEATED TO VERIFY     DELTA CHECK NOTED  07/17/2013 10.0* 12.0 - 15.0 g/dL Final  02/07/2013 12.3  12.2 - 16.2 g/dL Final     HCT  Date Value Ref Range Status  07/20/2013 23.7* 36.0 - 46.0 % Final  07/19/2013 23.5* 36.0 - 46.0 % Final  07/17/2013 30.2* 36.0 - 46.0 % Final  03/19/2013 33.9* 34.8 - 46.6 % Final  10/16/2012 38.4  34.8 - 46.6 % Final  01/03/2012 41.0  34.8 - 46.6 %  Final     HCT, POC  Date Value Ref Range Status  02/07/2013 38.5  37.7 - 47.9 % Final     WBC  Date Value Ref Range Status  07/20/2013 10.4  4.0 - 10.5 K/uL Final  07/19/2013 11.5* 4.0 - 10.5 K/uL Final  07/17/2013 11.2* 4.0 - 10.5 K/uL Final  03/19/2013 11.3* 3.9 - 10.3 10e3/uL Final  02/07/2013 11.6* 4.6 - 10.2 K/uL Final  10/16/2012 5.7  3.9 - 10.3 10e3/uL Final  01/03/2012 6.0  3.9 - 10.3 10e3/uL Final    Discharge Physical Exam:   General: alert Lochia: appropriate Uterine Fundus: firm Abdomen:  + bowel sounds, NTND Incision: no significant drainage, honeycomb dressing dry and intact DVT Evaluation: No evidence of DVT seen on physical exam. Negative Homan's  sign.  Intrapartum Procedures: cesarean: low cervical, transverse, Removal of right ovarian mass during surgery  Postpartum Procedures: none - declined blood transfusion  Complications-Operative and Postpartum: Right ovarian mass, Anemia  Discharge Diagnoses: Term Pregnancy-delivered and Post-date pregnancy, Primary c-section for non-reassuring FHRT, Anemia  Discharge Information:  Activity:           pelvic rest Diet:                routine Medications: PNV, Ibuprofen, Colace, Iron, Percocet and Mylicon Condition:      stable  Discharge to: home  Follow-up Information   Follow up with [facility]. Schedule an appointment as soon as possible for a visit in 6 weeks. (Per [employer] handout)    Specialty:  Obstetrics and Gynecology   Contact information:   [address] [address] [phone]       Farrel Gordon CNM 07/21/2013 1:20 PM

## 2013-07-21 NOTE — Lactation Note (Signed)
This note was copied from the chart of Teresa [name]. Lactation Consultation Note Noted 8% wt. Loss. Has DEBP, shells (didn't have on). Noted small nipples, Lt. Smaller than Rt. Lt. Nipples compresses inwards and baby is unable to latch to Lt. Nipple. The Rt. Nipple has short shaft, doesn't compress inwards, has bouncy areola. Breast are filling. Hand expression demonstrated, easily expressed. Encouraged to rub on nipples. Fitted for #16 NS, and #20 for later. Baby latched well w/NS in Football hold. Positioning and support demonstrated. Explained could have wt. Loss d/t inadequate milk transfer d/t shallow latch. Noted Upper labial frenulum. Humps tongue in back of soft palate, has limited movement past bottom gum. Demonstrated suck training w/gloved finger. Reported to RN and Energy manager. Patient Name: Teresa Brennan [id number] Date: 07/21/2013 Reason for consult: Follow-up assessment;Difficult latch;Infant weight loss   Maternal Data    Feeding Feeding Type: Breast Fed Nipple Type: Slow - flow Length of feed: 15 min  LATCH Score/Interventions Latch: Repeated attempts needed to sustain latch, nipple held in mouth throughout feeding, stimulation needed to elicit sucking reflex. Intervention(s): Skin to skin;Teach feeding cues;Waking techniques Intervention(s): Adjust position;Assist with latch;Breast massage;Breast compression  Audible Swallowing: A few with stimulation Intervention(s): Skin to skin;Hand expression Intervention(s): Skin to skin;Hand expression;Alternate breast massage  Type of Nipple: Everted at rest and after stimulation (very sm. short shaft nipples)  Comfort (Breast/Nipple): Filling, red/small blisters or bruises, mild/mod discomfort  Problem noted: Filling Interventions (Filling): Massage;Frequent nursing;Double electric pump;Hand pump  Hold (Positioning): Assistance needed to correctly position infant at breast and maintain latch. Intervention(s):  Breastfeeding basics reviewed;Support Pillows;Position options;Skin to skin  LATCH Score: 6  Lactation Tools Discussed/Used Tools: Shells;Nipple [place] Fuel;Pump Nipple shield size: 16;20 Shell Type: Inverted Breast pump type: Double-Electric Breast Pump   Consult Status Consult Status: Follow-up Date: 07/21/13 Follow-up type: In-patient    CARVER, Elta Guadeloupe 07/21/2013, 2:05 AM

## 2013-07-24 ENCOUNTER — Other Ambulatory Visit (HOSPITAL_BASED_OUTPATIENT_CLINIC_OR_DEPARTMENT_OTHER): Payer: 59

## 2013-07-24 ENCOUNTER — Telehealth: Payer: Self-pay | Admitting: Internal Medicine

## 2013-07-24 ENCOUNTER — Ambulatory Visit (HOSPITAL_BASED_OUTPATIENT_CLINIC_OR_DEPARTMENT_OTHER): Payer: 59 | Admitting: Internal Medicine

## 2013-07-24 VITALS — BP 125/84 | HR 97 | Temp 98.1°F | Resp 18 | Ht 66.0 in | Wt 168.6 lb

## 2013-07-24 DIAGNOSIS — E538 Deficiency of other specified B group vitamins: Secondary | ICD-10-CM

## 2013-07-24 DIAGNOSIS — D510 Vitamin B12 deficiency anemia due to intrinsic factor deficiency: Secondary | ICD-10-CM

## 2013-07-24 DIAGNOSIS — D649 Anemia, unspecified: Secondary | ICD-10-CM

## 2013-07-24 DIAGNOSIS — D500 Iron deficiency anemia secondary to blood loss (chronic): Secondary | ICD-10-CM

## 2013-07-24 DIAGNOSIS — Z98891 History of uterine scar from previous surgery: Secondary | ICD-10-CM

## 2013-07-24 DIAGNOSIS — D259 Leiomyoma of uterus, unspecified: Secondary | ICD-10-CM

## 2013-07-24 DIAGNOSIS — D509 Iron deficiency anemia, unspecified: Secondary | ICD-10-CM

## 2013-07-24 LAB — CBC WITH DIFFERENTIAL/PLATELET
BASO%: 0.4 % (ref 0.0–2.0)
BASOS ABS: 0 10*3/uL (ref 0.0–0.1)
EOS%: 1.3 % (ref 0.0–7.0)
Eosinophils Absolute: 0.1 10*3/uL (ref 0.0–0.5)
HCT: 28.7 % — ABNORMAL LOW (ref 34.8–46.6)
HEMOGLOBIN: 9.1 g/dL — AB (ref 11.6–15.9)
LYMPH#: 2.3 10*3/uL (ref 0.9–3.3)
LYMPH%: 20.3 % (ref 14.0–49.7)
MCH: 24 pg — AB (ref 25.1–34.0)
MCHC: 31.7 g/dL (ref 31.5–36.0)
MCV: 75.9 fL — AB (ref 79.5–101.0)
MONO#: 0.5 10*3/uL (ref 0.1–0.9)
MONO%: 4.8 % (ref 0.0–14.0)
NEUT#: 8.3 10*3/uL — ABNORMAL HIGH (ref 1.5–6.5)
NEUT%: 73.2 % (ref 38.4–76.8)
Platelets: 364 10*3/uL (ref 145–400)
RBC: 3.78 10*6/uL (ref 3.70–5.45)
RDW: 15.2 % — ABNORMAL HIGH (ref 11.2–14.5)
WBC: 11.4 10*3/uL — ABNORMAL HIGH (ref 3.9–10.3)

## 2013-07-24 MED ORDER — CYANOCOBALAMIN 1000 MCG/ML IJ SOLN
1000.0000 ug | Freq: Once | INTRAMUSCULAR | Status: AC
  Administered 2013-07-24: 1000 ug via INTRAMUSCULAR

## 2013-07-24 MED ORDER — CYANOCOBALAMIN 1000 MCG/ML IJ SOLN
INTRAMUSCULAR | Status: AC
  Filled 2013-07-24: qty 1

## 2013-07-24 NOTE — Telephone Encounter (Signed)
, °

## 2013-07-24 NOTE — Progress Notes (Signed)
Cushing Y, MD [address] [place] [address]  DIAGNOSIS: Iron deficiency anemia due to chronic blood loss - Plan: CBC with Differential, Ferritin, Iron and TIBC [facility], CBC with Differential, Ferritin, Iron and TIBC [facility], Basic metabolic panel (Bmet) - [facility]  Vitamin B12 deficiency - Plan: cyanocobalamin ((VITAMIN B-12)) injection 1,000 mcg  Status post primary low transverse cesarean section--NRFHR  Uterine leiomyoma, unspecified location  Chief Complaint: Anemia  CURRENT THERAPY: Monthly vitamin B12 shots; Ferrous sulfate 325 mg twice daily.   INTERVAL HISTORY: Teresa Brennan 28 y.o. female with a history of pernicious anemia and vitamin B12 deficiency is here for follow up.  She was last seen by me on .  Her last vitamin B12 shot was in [place].  She reports that she delivered a healthy female via c-section.  She was discharged on 06/28 with a hemoglobin of 7.7. She is asymptomatic for picca but does report fatigue related to post-delivery.  She reports worsening constipation while on her daily oral iron.     MEDICAL HISTORY: Past Medical History  Diagnosis Date  . Ovarian tumor   . Headache(784.0)   . Anemia   . History of fatigue 03/10/11  . Fibroid   . Irregular menses   . Migraine   . Painful intercourse 11/15/10  . Dyspareunia, female   . 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    ALLERGIES:  has No Known Allergies.  MEDICATIONS: has a current medication list which includes the following prescription(s): docusate sodium, ferrous sulfate, ibuprofen, oxycodone-acetaminophen, prenatal multivitamin, simethicone, and  norethindrone.  SURGICAL HISTORY:  Past Surgical History  Procedure Laterality Date  . Hysteroscopy    . Cesarean section N/A 07/18/2013    Procedure: CESAREAN SECTION;  Surgeon: Ena Dawley, MD;  Location: [facility];  Service: Obstetrics;  Laterality: N/A;    REVIEW OF SYSTEMS:   Constitutional: Denies fevers, chills or abnormal weight loss Eyes: Denies blurriness of vision Ears, nose, mouth, throat, and face: Denies mucositis or sore throat Respiratory: Denies cough, dyspnea or wheezes Cardiovascular: Denies palpitation, chest discomfort or lower extremity swelling Gastrointestinal:  Denies nausea, heartburn or change in bowel habits Skin: Denies abnormal skin rashes Lymphatics: Denies new lymphadenopathy or easy bruising Neurological:Denies numbness, tingling or new weaknesses Behavioral/Psych: Mood is stable, no new changes  All other systems were reviewed with the patient and are negative.  PHYSICAL EXAMINATION: ECOG PERFORMANCE STATUS: 0 - Asymptomatic  Blood pressure 125/84, pulse 97, temperature 98.1 F (36.7 C), temperature source Oral, resp. rate 18, height 5\' 6"  (1.676 m), weight 168 lb 9.6 oz (76.476 kg), last menstrual period 10/03/2012, unknown if currently breastfeeding.  GENERAL:alert, no distress and comfortable; well developed and well nourished.  SKIN: skin color, texture, turgor are normal, no rashes or significant lesions; Minor facial scarring from acne.  EYES: normal, Conjunctiva are pink and non-injected, sclera clear OROPHARYNX:no exudate, no erythema and lips, buccal mucosa, and tongue normal  NECK: supple, thyroid normal size, non-tender, without nodularity LYMPH:  no palpable lymphadenopathy in the cervical or supraclavicular LUNGS: clear to auscultation and percussion with normal breathing effort HEART: regular rate & rhythm and no murmurs and no lower extremity edema ABDOMEN:abdomen soft, non-tender and normal bowel sounds; Protuberant with  a 5 month  pregnancy.  Musculoskeletal:no cyanosis of digits and no clubbing  NEURO: alert & oriented x 3 with fluent speech, no focal motor/sensory deficits  LABORATORY DATA: Results for orders placed in visit on 07/24/13 (from the past 48 hour(s))  CBC WITH DIFFERENTIAL     Status: Abnormal   Collection Time    07/24/13  3:23 PM      Result Value Ref Range   WBC 11.4 (*) 3.9 - 10.3 10e3/uL   NEUT# 8.3 (*) 1.5 - 6.5 10e3/uL   HGB 9.1 (*) 11.6 - 15.9 g/dL   HCT 28.7 (*) 34.8 - 46.6 %   Platelets 364  145 - 400 10e3/uL   MCV 75.9 (*) 79.5 - 101.0 fL   MCH 24.0 (*) 25.1 - 34.0 pg   MCHC 31.7  31.5 - 36.0 g/dL   RBC 3.78  3.70 - 5.45 10e6/uL   RDW 15.2 (*) 11.2 - 14.5 %   lymph# 2.3  0.9 - 3.3 10e3/uL   MONO# 0.5  0.1 - 0.9 10e3/uL   Eosinophils Absolute 0.1  0.0 - 0.5 10e3/uL   Basophils Absolute 0.0  0.0 - 0.1 10e3/uL   NEUT% 73.2  38.4 - 76.8 %   LYMPH% 20.3  14.0 - 49.7 %   MONO% 4.8  0.0 - 14.0 %   EOS% 1.3  0.0 - 7.0 %   BASO% 0.4  0.0 - 2.0 %    Iron/TIBC/Ferritin    Component Value Date/Time   IRON 31* 03/19/2013 1542   IRON 93 12/06/2011 0843   TIBC 434 03/19/2013 1542   TIBC 284 12/06/2011 0843   FERRITIN 14 03/19/2013 1542   FERRITIN 44 12/06/2011 0843    RADIOGRAPHIC STUDIES: No results found.  ASSESSMENT: Pernicious anemia; Iron deficiency anemia.   PLAN:  1. Pernicious anemia for which she receives monthly vitamin B12 injections. She will receive a shot monthly. She will continue the current regimen.   2. Iron deficiency anemia. Her hemoglobin is 9.1 with a MCV of 75.4.  She will continue  iron in the form of NuIron twice daily. We counseled her extensively to use fiber supplementation and metamucil while on iron.  Her constipation is also a common problem associated with her post-pregnancy.  Her iron studies are pending. We will repeat her iron indices in 3 months. She was provided a handout on symptoms of anemia and high fiber diet.   3. She follows up in 6 months  for a symptom visit, CBC, iron studies.   All questions were answered. The patient knows to call the clinic with any problems, questions or concerns. We can certainly see the patient much sooner if necessary.  I spent 10 minutes counseling the patient face to face. The total time spent in the appointment was 15 minutes.    Jayshun Galentine, MD 07/25/2013 8:37 AM

## 2013-07-25 ENCOUNTER — Encounter: Payer: Self-pay | Admitting: Internal Medicine

## 2013-07-28 LAB — IRON AND TIBC CHCC
%SAT: 16 % — ABNORMAL LOW (ref 21–57)
Iron: 64 ug/dL (ref 41–142)
TIBC: 410 ug/dL (ref 236–444)
UIBC: 346 ug/dL (ref 120–384)

## 2013-07-28 LAB — FERRITIN CHCC: FERRITIN: 55 ng/mL (ref 9–269)

## 2013-10-24 ENCOUNTER — Ambulatory Visit: Payer: 59

## 2013-10-24 ENCOUNTER — Other Ambulatory Visit: Payer: 59

## 2013-10-25 ENCOUNTER — Telehealth: Payer: Self-pay | Admitting: Hematology

## 2013-10-25 NOTE — Telephone Encounter (Signed)
returned pt call and r/s missed appt per pt request....pt ok adn aware of d.t

## 2013-10-27 ENCOUNTER — Ambulatory Visit (INDEPENDENT_AMBULATORY_CARE_PROVIDER_SITE_OTHER): Payer: 59 | Admitting: Internal Medicine

## 2013-10-27 VITALS — BP 116/80 | HR 115 | Temp 99.7°F | Resp 16 | Ht 66.5 in | Wt 159.0 lb

## 2013-10-27 DIAGNOSIS — J0100 Acute maxillary sinusitis, unspecified: Secondary | ICD-10-CM

## 2013-10-27 DIAGNOSIS — N926 Irregular menstruation, unspecified: Secondary | ICD-10-CM

## 2013-10-27 MED ORDER — AMOXICILLIN 500 MG PO CAPS: 1000.0000 mg | ORAL_CAPSULE | Freq: Two times a day (BID) | ORAL | Status: DC

## 2013-10-27 NOTE — Progress Notes (Signed)
Subjective:  This chart was scribed for Teresa Lin, MD by Erling Conte, Medical Scribe. This patient was seen in Room 13 and the patient's care was started at 6:41 PM.   Patient ID: Teresa Brennan, female    DOB: May 30, 1985, 28 y.o.   MRN: [id number]  Chief Complaint  Patient presents with  . Facial Pain   HPI HPI Comments: Teresa Brennan is a 28 y.o. female who presents to the [facility] complaining of a possible sinus infection for 8 days. Pt states that her symptoms began as a sore throat and gradually progressed into other symptoms. Pt is complaining of facial pain due to sinus pressure, bilateral otalgia, cough productive of green sputum, neck pain, subjective fever, and shortness of breath. Pt states that the symptoms are worse in the morning and at night. Pt has not taken anything for the symptoms. She denies any h/o recurrent sinus infections. She denies any neck stiffness, rash, nausea, vomiting or diarrhea.   Pt has a second complaint of irregular menstrual periods. She states she had a c-section 36mos ago and is unsure if this is contributory. Was on minipill while nursing She is currently on Seasonique birth control pills and has been taking the medication for almost 2 months. She continues to spot frequently. Had 2 weeks slow bleeding in early sept. No pain/dysparunia/dischg  Patient Active Problem List   Diagnosis Date Noted  . Status post primary low transverse cesarean section--NRFHR 07/20/2013  . Uterine fibroid 07/17/2013  . 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 Cervical high risk HPV (human papillomavirus) test positive 07/17/2013  . Pernicious anemia 10/03/2011  . Vitamin B12 deficiency 03/10/2011  . Anemia 03/10/2011   Past Medical History  Diagnosis Date  . Ovarian tumor   . Headache(784.0)   . Anemia   . History of fatigue 03/10/11  . Fibroid   . Irregular menses   . Migraine   . Painful  intercourse 11/15/10  . Dyspareunia, female   . Asthma    Past Surgical History  Procedure Laterality Date  . Hysteroscopy    . Cesarean section N/A 07/18/2013    Procedure: CESAREAN SECTION;  Surgeon: Ena Dawley, MD;  Location: [facility];  Service: Obstetrics;  Laterality: N/A;   No Known Allergies Prior to Admission medications   Medication Sig Start Date End Date Taking? Authorizing Provider  docusate sodium (COLACE) 100 MG capsule Take 1 capsule (100 mg total) by mouth 2 (two) times daily. 07/21/13  Yes Farrel Gordon, CNM  ferrous sulfate 325 (65 FE) MG tablet Take 1 tablet (325 mg total) by mouth 2 (two) times daily with a meal. 07/21/13  Yes Farrel Gordon, CNM  ibuprofen (ADVIL,MOTRIN) 600 MG tablet Take 1 tablet (600 mg total) by mouth every 6 (six) hours as needed. 07/21/13  Yes Farrel Gordon, CNM  norethindrone (ORTHO MICRONOR) 0.35 MG tablet Take 1 tablet (0.35 mg total) by mouth daily. 07/21/13  Yes Farrel Gordon, CNM  oxyCODONE-acetaminophen (PERCOCET/ROXICET) 5-325 MG per tablet Take 1-2 tablets by mouth every 4 (four) hours as needed for moderate pain or severe pain. 07/21/13   Farrel Gordon, CNM  Prenatal Vit-Fe Fumarate-FA (PRENATAL MULTIVITAMIN) TABS tablet Take 1 tablet by mouth daily at 12 noon.    Historical Provider, MD  simethicone (MYLICON) 80 MG chewable tablet Chew 1 tablet (80 mg total) by mouth 3 (three) times daily after meals. 07/21/13   Farrel Gordon, CNM     Review of  Systems  Genitourinary: Negative for dysuria, frequency, vaginal discharge, difficulty urinating and pelvic pain.         Objective:   Physical Exam  Nursing note and vitals reviewed. Constitutional: She is oriented to person, place, and time. She appears well-developed and well-nourished. No distress.  HENT:  Head: Normocephalic and atraumatic.  Right Ear: External ear normal.  Left Ear: External ear normal.  Mouth/Throat: Oropharynx is clear and moist.  Swollen  turbinates and purulent discharge bilaterally. Marked tenderness of left maxillary area to percussion  Eyes: Conjunctivae and EOM are normal. Pupils are equal, round, and reactive to light.  Neck: Neck supple.  Cardiovascular: Normal rate.   Pulmonary/Chest: Effort normal and breath sounds normal. No respiratory distress. She has no wheezes. She has no rales.  CTA  Musculoskeletal: Normal range of motion.  Neurological: She is alert and oriented to person, place, and time.  Skin: Skin is warm and dry.  Psychiatric: She has a normal mood and affect. Her behavior is normal.      Filed Vitals:   10/27/13 1832  BP: 116/80  Pulse: 115  Temp: 99.7 F (37.6 C)  TempSrc: Oral  Resp: 16  Height: 5' 6.5" (1.689 m)  Weight: 159 lb (72.122 kg)  SpO2: 98%        Assessment & Plan:   Acute maxillary sinusitis  Irregular menstrual bleeding due to OCPs being started--continue seasonal until 1st 90 d completed and then f/u if not stable  Meds ordered this encounter  Medications  . amoxicillin (AMOXIL) 500 MG capsule    Sig: Take 2 capsules (1,000 mg total) by mouth 2 (two) times daily.    Dispense:  40 capsule    Refill:  0  sudafed 12hour  I have completed the patient encounter in its entirety as documented by the scribe, with editing by me where necessary. Teresa Brennan, M.D.

## 2013-11-07 ENCOUNTER — Telehealth: Payer: Self-pay | Admitting: Hematology

## 2013-11-07 NOTE — Telephone Encounter (Signed)
Pt called to r/s labs/ov due to work pt confirmed updated sch.... Cherylann Banas

## 2013-11-10 ENCOUNTER — Other Ambulatory Visit: Payer: 59

## 2013-11-10 ENCOUNTER — Ambulatory Visit: Payer: 59

## 2013-11-26 ENCOUNTER — Other Ambulatory Visit: Payer: Self-pay | Admitting: *Deleted

## 2013-11-26 DIAGNOSIS — E538 Deficiency of other specified B group vitamins: Secondary | ICD-10-CM

## 2013-11-26 DIAGNOSIS — D649 Anemia, unspecified: Secondary | ICD-10-CM

## 2013-11-27 ENCOUNTER — Encounter: Payer: Self-pay | Admitting: Hematology

## 2013-11-27 ENCOUNTER — Telehealth: Payer: Self-pay | Admitting: Hematology

## 2013-11-27 ENCOUNTER — Ambulatory Visit (HOSPITAL_BASED_OUTPATIENT_CLINIC_OR_DEPARTMENT_OTHER): Payer: 59 | Admitting: Hematology

## 2013-11-27 ENCOUNTER — Other Ambulatory Visit (HOSPITAL_BASED_OUTPATIENT_CLINIC_OR_DEPARTMENT_OTHER): Payer: 59

## 2013-11-27 VITALS — BP 121/74 | HR 81 | Temp 98.4°F | Resp 18 | Ht 66.5 in | Wt 165.8 lb

## 2013-11-27 DIAGNOSIS — D509 Iron deficiency anemia, unspecified: Secondary | ICD-10-CM | POA: Diagnosis not present

## 2013-11-27 DIAGNOSIS — D510 Vitamin B12 deficiency anemia due to intrinsic factor deficiency: Secondary | ICD-10-CM

## 2013-11-27 DIAGNOSIS — E538 Deficiency of other specified B group vitamins: Secondary | ICD-10-CM

## 2013-11-27 DIAGNOSIS — D649 Anemia, unspecified: Secondary | ICD-10-CM

## 2013-11-27 LAB — CBC WITH DIFFERENTIAL/PLATELET
BASO%: 0.9 % (ref 0.0–2.0)
Basophils Absolute: 0.1 10*3/uL (ref 0.0–0.1)
EOS%: 1.9 % (ref 0.0–7.0)
Eosinophils Absolute: 0.1 10*3/uL (ref 0.0–0.5)
HEMATOCRIT: 35.7 % (ref 34.8–46.6)
HGB: 11.4 g/dL — ABNORMAL LOW (ref 11.6–15.9)
LYMPH%: 49.5 % (ref 14.0–49.7)
MCH: 24.1 pg — AB (ref 25.1–34.0)
MCHC: 31.9 g/dL (ref 31.5–36.0)
MCV: 75.3 fL — AB (ref 79.5–101.0)
MONO#: 0.4 10*3/uL (ref 0.1–0.9)
MONO%: 8.3 % (ref 0.0–14.0)
NEUT#: 2.1 10*3/uL (ref 1.5–6.5)
NEUT%: 39.4 % (ref 38.4–76.8)
Platelets: 235 10*3/uL (ref 145–400)
RBC: 4.74 10*6/uL (ref 3.70–5.45)
RDW: 14.8 % — ABNORMAL HIGH (ref 11.2–14.5)
WBC: 5.3 10*3/uL (ref 3.9–10.3)
lymph#: 2.6 10*3/uL (ref 0.9–3.3)

## 2013-11-27 LAB — COMPREHENSIVE METABOLIC PANEL (CC13)
ALK PHOS: 74 U/L (ref 40–150)
ALT: 13 U/L (ref 0–55)
AST: 16 U/L (ref 5–34)
Albumin: 3.4 g/dL — ABNORMAL LOW (ref 3.5–5.0)
Anion Gap: 7 mEq/L (ref 3–11)
BILIRUBIN TOTAL: 0.22 mg/dL (ref 0.20–1.20)
BUN: 10 mg/dL (ref 7.0–26.0)
CO2: 24 mEq/L (ref 22–29)
Calcium: 8.9 mg/dL (ref 8.4–10.4)
Chloride: 107 mEq/L (ref 98–109)
Creatinine: 0.9 mg/dL (ref 0.6–1.1)
Glucose: 93 mg/dl (ref 70–140)
Potassium: 3.8 mEq/L (ref 3.5–5.1)
SODIUM: 138 meq/L (ref 136–145)
Total Protein: 7.1 g/dL (ref 6.4–8.3)

## 2013-11-27 MED ORDER — CYANOCOBALAMIN 1000 MCG/ML IJ SOLN
INTRAMUSCULAR | Status: AC
  Filled 2013-11-27: qty 1

## 2013-11-27 MED ORDER — CYANOCOBALAMIN 1000 MCG/ML IJ SOLN
1000.0000 ug | Freq: Once | INTRAMUSCULAR | Status: AC
  Administered 2013-11-27: 1000 ug via INTRAMUSCULAR

## 2013-11-27 NOTE — Telephone Encounter (Signed)
gv adn printed appt sched and avs for pt for March 2016 °

## 2013-11-27 NOTE — Progress Notes (Signed)
[facility] PROGRESS NOTE DATE OF SERVICE: 11/27/2013  Delice Lesch, MD [address] [place] [address]  DIAGNOSIS: Iron deficiency anemia - Plan: CBC with Differential, Ferritin, Iron and TIBC [facility], Basic metabolic panel (Bmet) - [facility]  Pernicious anemia - Plan: cyanocobalamin ((VITAMIN B-12)) injection 1,000 mcg  Chief Complaint: Follow up  CURRENT THERAPY: Monthly vitamin B12 shots at home given by sister; Ferrous sulfate 325 mg twice daily (Patient not compliant with it)  INTERVAL HISTORY:  [name] 28 y.o. female with a history of pernicious anemia and vitamin B12 deficiency is here for follow up.  She was last seen by Dr Juliann Mule on 07/24/13. She has been getting B 12 shots for almost 2 years and she cannot absorb the B12 vitamin.  She reports that she delivered a healthy female via c-section and her daughter is now 40 months old.  She was discharged on 07/20/13 with a hemoglobin of 7.7. She is asymptomatic for picca but does report fatigue related to post-delivery.  She reports worsening constipation while on her daily oral iron.     Since her pregnancy her hemoglobin has improved. She works in Tourist information centre manager at [employer]. She denies excessive fatigue, easy bruising, and GI, GU or Gynecological bleeding. I told her to take Iron at least few times a week. According to a study published in Blood November 13 2013 (Volume 126 Number 17) by Logan and the accompanying editorial by Conni Elliot from [place], instead of taking iron 2-3 times per day, we can get better results by giving iron just 3 times a week like on Monday, Wednesday and Friday. This has to do with the role of Hepcidin in iron metabolism and taking three times a day can interfere with absorption of iron by increasing serum Hepcidin.  MEDICAL HISTORY: Past Medical History  Diagnosis Date  . Ovarian tumor   . Headache(784.0)   . Anemia   . History of  fatigue 03/10/11  . Fibroid   . Irregular menses   . Migraine   . Painful intercourse 11/15/10  . Dyspareunia, female   . 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    ALLERGIES:  has No Known Allergies.  MEDICATIONS: has a current medication list which includes the following prescription(s): docusate sodium, ferrous sulfate, ibuprofen, norethindrone, and simethicone, and the following Facility-Administered Medications: cyanocobalamin.  SURGICAL HISTORY:  Past Surgical History  Procedure Laterality Date  . Hysteroscopy    . Cesarean section N/A 07/18/2013    Procedure: CESAREAN SECTION;  Surgeon: Ena Dawley, MD;  Location: [facility];  Service: Obstetrics;  Laterality: N/A;    REVIEW OF SYSTEMS:   Constitutional: Denies fevers, chills or abnormal weight loss Eyes: Denies blurriness of vision Ears, nose, mouth, throat, and face: Denies mucositis or sore throat Respiratory: Denies cough, dyspnea or wheezes Cardiovascular: Denies palpitation, chest discomfort or lower extremity swelling Gastrointestinal:  Denies nausea, heartburn or change in bowel habits Skin: Denies abnormal skin rashes Lymphatics: Denies new lymphadenopathy or easy bruising Neurological:Denies numbness, tingling or new weaknesses Behavioral/Psych: Mood is stable, no new changes  All other systems were reviewed with the patient and are negative.  PHYSICAL EXAMINATION: ECOG PERFORMANCE STATUS: 0  Blood pressure 121/74, pulse 81, temperature 98.4  F (36.9 C), temperature source Oral, resp. rate 18, height 5' 6.5" (1.689 m), weight 165 lb 12.8 oz (75.206 kg), not currently breastfeeding.  GENERAL:alert, no distress and comfortable; well developed and  well nourished.  SKIN: skin color, texture, turgor are normal, no rashes or significant lesions; Minor facial scarring from acne.  EYES: normal, Conjunctiva are pink and non-injected, sclera clear OROPHARYNX:no exudate, no erythema and lips, buccal mucosa, and tongue normal  NECK: supple, thyroid normal size, non-tender, without nodularity LYMPH:  no palpable lymphadenopathy in the cervical or supraclavicular LUNGS: clear to auscultation and percussion with normal breathing effort HEART: regular rate & rhythm and no murmurs and no lower extremity edema ABDOMEN:abdomen soft, non-tender and normal bowel sounds; Protuberant with a 5 month pregnancy.  Musculoskeletal:no cyanosis of digits and no clubbing  NEURO: alert & oriented x 3 with fluent speech, no focal motor/sensory deficits  LABORATORY DATA: Results for orders placed or performed in visit on 11/27/13 (from the past 48 hour(s))  CBC with Differential     Status: Abnormal   Collection Time: 11/27/13  2:59 PM  Result Value Ref Range   WBC 5.3 3.9 - 10.3 10e3/uL   NEUT# 2.1 1.5 - 6.5 10e3/uL   HGB 11.4 (L) 11.6 - 15.9 g/dL   HCT 35.7 34.8 - 46.6 %   Platelets 235 145 - 400 10e3/uL   MCV 75.3 (L) 79.5 - 101.0 fL   MCH 24.1 (L) 25.1 - 34.0 pg   MCHC 31.9 31.5 - 36.0 g/dL   RBC 4.74 3.70 - 5.45 10e6/uL   RDW 14.8 (H) 11.2 - 14.5 %   lymph# 2.6 0.9 - 3.3 10e3/uL   MONO# 0.4 0.1 - 0.9 10e3/uL   Eosinophils Absolute 0.1 0.0 - 0.5 10e3/uL   Basophils Absolute 0.1 0.0 - 0.1 10e3/uL   NEUT% 39.4 38.4 - 76.8 %   LYMPH% 49.5 14.0 - 49.7 %   MONO% 8.3 0.0 - 14.0 %   EOS% 1.9 0.0 - 7.0 %   BASO% 0.9 0.0 - 2.0 %  Comprehensive metabolic panel (Cmet) - [facility]     Status: Abnormal   Collection Time: 11/27/13  2:59 PM  Result Value Ref Range   Sodium 138 136 - 145 mEq/L   Potassium 3.8 3.5 - 5.1 mEq/L   Chloride 107 98 - 109 mEq/L   CO2 24 22 - 29 mEq/L   Glucose 93 70 - 140 mg/dl   BUN 10.0 7.0 - 26.0 mg/dL   Creatinine 0.9 0.6 - 1.1  mg/dL   Total Bilirubin 0.22 0.20 - 1.20 mg/dL   Alkaline Phosphatase 74 40 - 150 U/L   AST 16 5 - 34 U/L   ALT 13 0 - 55 U/L   Total Protein 7.1 6.4 - 8.3 g/dL   Albumin 3.4 (L) 3.5 - 5.0 g/dL   Calcium 8.9 8.4 - 10.4 mg/dL   Anion Gap 7 3 - 11 mEq/L    Iron/TIBC/Ferritin    Component Value Date/Time   IRON 64 07/24/2013 1523   IRON 93 12/06/2011 0843   TIBC 410 07/24/2013 1523   TIBC 284 12/06/2011 0843   FERRITIN 55 07/24/2013 1523   FERRITIN 44 12/06/2011 0843    RADIOGRAPHIC STUDIES: No results found.  ASSESSMENT: Pernicious anemia; Iron deficiency anemia.   PLAN:   1. Pernicious anemia for which she receives monthly vitamin B12 injections. She will receive a shot monthly. She will continue the current regimen. She was given a shot of B12 in  office today as per her request.  2. Iron deficiency anemia. Her hemoglobin is 11.4 up from 9.1.  She will continue  iron oral and I emphasized to her that rather then not taking it at all, take it few times a week. We counseled her extensively to use fiber supplementation and metamucil while on iron.  Her constipation is also a common problem associated with her post-pregnancy.  We will repeat her iron indices in 4 months.   3. She follows up in 4 months for a symptom visit, CBC, iron studies.   All questions were answered. The patient knows to call the clinic with any problems, questions or concerns. We can certainly see the patient much sooner if necessary.  I spent 10 minutes counseling the patient face to face. The total time spent in the appointment was 15 minutes.    Bernadene Bell, MD Medical Hematologist/Oncologist [facility] Pager: [phone] Office No: [phone]

## 2013-11-27 NOTE — Patient Instructions (Signed)

## 2013-12-09 ENCOUNTER — Other Ambulatory Visit: Payer: Self-pay | Admitting: *Deleted

## 2013-12-09 DIAGNOSIS — D509 Iron deficiency anemia, unspecified: Secondary | ICD-10-CM

## 2013-12-09 MED ORDER — CYANOCOBALAMIN 1000 MCG/ML IJ SOLN: 1000.0000 ug | Freq: Once | INTRAMUSCULAR | Status: DC

## 2014-01-30 ENCOUNTER — Ambulatory Visit: Payer: 59

## 2014-01-30 ENCOUNTER — Other Ambulatory Visit: Payer: 59

## 2014-02-01 ENCOUNTER — Ambulatory Visit (INDEPENDENT_AMBULATORY_CARE_PROVIDER_SITE_OTHER): Payer: 59 | Admitting: Physician Assistant

## 2014-02-01 VITALS — BP 120/68 | HR 99 | Temp 98.4°F | Resp 16 | Ht 66.0 in | Wt 168.2 lb

## 2014-02-01 DIAGNOSIS — Z111 Encounter for screening for respiratory tuberculosis: Secondary | ICD-10-CM

## 2014-02-01 DIAGNOSIS — Z0000 Encounter for general adult medical examination without abnormal findings: Secondary | ICD-10-CM

## 2014-02-01 NOTE — Progress Notes (Signed)
Pt had PPD placed on L forearm @ 9:00 am, 02/01/2014. She is aware to RTC within the 48-72 hour time window.   Tuberculosis Risk Questionnaire  1. No Were you born outside the [place] in one of the following parts of the world: [place], [place], [place], [place] or [place]?    2. No Have you traveled outside the [place] and lived for more than one month in one of the following parts of the world: [place], [place], [place], [place] or [place]?    3. No Do you have a compromised immune system such as from any of the following conditions:HIV/AIDS, organ or bone marrow transplantation, diabetes, immunosuppressive medicines (e.g. Prednisone, Remicaide), leukemia, lymphoma, cancer of the head or neck, gastrectomy or jejunal bypass, end-stage renal disease (on dialysis), or silicosis?     4. Yes Health care facility Have you ever or do you plan on working in: a residential care center, a health care facility, a jail or prison or homeless shelter?    5. No Have you ever: injected illegal drugs, used crack cocaine, lived in a homeless shelter  or been in jail or prison?     6. No Have you ever been exposed to anyone with infectious tuberculosis?    Tuberculosis Symptom Questionnaire  Do you currently have any of the following symptoms?  1. No Unexplained cough lasting more than 3 weeks?   2. No Unexplained fever lasting more than 3 weeks.   3. No Night Sweats (sweating that leaves the bedclothes and sheets wet)     4. No Shortness of Breath   5. No Chest Pain   6. No Unintentional weight loss    7. No Unexplained fatigue (very tired for no reason)

## 2014-02-01 NOTE — Progress Notes (Signed)
Subjective:    Patient ID: Teresa Brennan, female    DOB: March 08, 1985, 29 y.o.   MRN: [id number]   PCP: Delice Lesch, MD  Chief Complaint  Patient presents with  . Annual Exam    [facility] care program forms    No Known Allergies  Patient Active Problem List   Diagnosis Date Noted  . Status post primary low transverse cesarean section--NRFHR 07/20/2013  . Uterine fibroid 07/17/2013  . 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 Cervical high risk HPV (human papillomavirus) test positive 07/17/2013  . Pernicious anemia 10/03/2011  . Vitamin B12 deficiency 03/10/2011  . Anemia 03/10/2011    Prior to Admission medications   Medication Sig Start Date End Date Taking? Authorizing Provider  cyanocobalamin (,VITAMIN B-12,) 1000 MCG/ML injection Inject 1 mL (1,000 mcg total) into the muscle once. EVERY 30 DAYS. 12/09/13  Yes Aasim Marla Roe, MD  norethindrone (ORTHO MICRONOR) 0.35 MG tablet Take 1 tablet (0.35 mg total) by mouth daily. 07/21/13  Yes Farrel Gordon, CNM     Past Medical History  Diagnosis Date  . Ovarian tumor   . Headache(784.0)   . Anemia   . History of fatigue 03/10/11  . Fibroid   . Irregular menses   . Migraine   . Painful intercourse 11/15/10  . Dyspareunia, female   . Asthma      Past Surgical History  Procedure Laterality Date  . Hysteroscopy    . Cesarean section N/A 07/18/2013    Procedure: CESAREAN SECTION;  Surgeon: Ena Dawley, MD;  Location: [facility];  Service: Obstetrics;  Laterality: N/A;   OB History    Gravida Para Term Preterm AB TAB SAB Ectopic Multiple Living   1 1 1       1       Family History  Problem Relation Age of Onset  . Adopted: Yes  . Sickle cell trait Daughter     History   Social History  . Marital Status: Single    Spouse Name: n/a    Number of Children: 1  . Years of Education: 12+   Occupational History  . billing specialist     [employer]   Social History Main  Topics  . Smoking status: Never Smoker   . Smokeless tobacco: Never Used  . Alcohol Use: No  . Drug Use: No  . Sexual Activity:    Partners: Male    Birth Control/ Protection: Pill   Other Topics Concern  . None   Social History Narrative   Working on an associates degree in Nature conservation officer.   Adopted, along with her fraternal twin sister, at 6 days old.   She is in contact with other biological siblings.   Lives alone, with her daughter, born in 06/2013.   Her adoptive parents are both deceased.   Her sister lives nearby.    HPI  Will foster the 30 year old child of one of her adoptive sisters. Her own child is 30 old. Anemia is managed Q3 months at the [facility]. She has an appointment for GYN care next week with Dr. Raphael Gibney.  She needs a TB skin test and form completed for foster parenting.  Review of Systems  Musculoskeletal: Negative for back pain, joint swelling, arthralgias, gait problem, neck pain and neck stiffness.       Leg soreness, intermittently since middle school. 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 Bruise easily. Feel sore to the touch  all the time. Doesn't limit activity. Aching. Soreness. Worst behind the knee. Gets "Charlie Horses" a lot.  All other systems reviewed and are negative.      Objective:   Physical Exam  Constitutional: She is oriented to person, place, and time. Vital signs are normal. She appears well-developed and well-nourished. She is active and cooperative. No distress.  BP 120/68 mmHg  Pulse 99  Temp(Src) 98.4 F (36.9 C) (Oral)  Resp 16  Ht 5\' 6"  (1.676 m)  Wt 168 lb 3.2 oz (76.295 kg)  BMI 27.16 kg/m2  SpO2 99%  LMP   Breastfeeding? No   HENT:  Head: Normocephalic and atraumatic.  Right Ear: Hearing, tympanic membrane, external ear and ear canal normal. No foreign bodies.  Left Ear: Hearing, tympanic membrane, external ear and ear canal normal. No foreign bodies.  Nose: Nose normal.  Mouth/Throat:  Uvula is midline, oropharynx is clear and moist and mucous membranes are normal. No oral lesions. Normal dentition. No dental abscesses or uvula swelling. No oropharyngeal exudate.  Eyes: Conjunctivae, EOM and lids are normal. Pupils are equal, round, and reactive to light. Right eye exhibits no discharge. Left eye exhibits no discharge. No scleral icterus.  Fundoscopic exam:      The right eye shows no arteriolar narrowing, no AV nicking, no exudate, no hemorrhage and no papilledema. The right eye shows red reflex.       The left eye shows no arteriolar narrowing, no AV nicking, no exudate, no hemorrhage and no papilledema. The left eye shows red reflex.  Neck: Trachea normal, normal range of motion and full passive range of motion without pain. Neck supple. No spinous process tenderness and no muscular tenderness present. No thyroid mass and no thyromegaly present.  Cardiovascular: Normal rate, regular rhythm, normal heart sounds, intact distal pulses and normal pulses.   Pulmonary/Chest: Effort normal and breath sounds normal.  Musculoskeletal: She exhibits no edema.       Right knee: Normal.       Left knee: Normal.       Cervical back: Normal.       Thoracic back: Normal.       Lumbar back: Normal.       Right lower leg: She exhibits tenderness. She exhibits no bony tenderness, no swelling, no edema, no deformity and no laceration.       Left lower leg: She exhibits tenderness. She exhibits no bony tenderness, no swelling, no edema, no deformity and no laceration.  Lymphadenopathy:       Head (right side): No tonsillar, no preauricular, no posterior auricular and no occipital adenopathy present.       Head (left side): No tonsillar, no preauricular, no posterior auricular and no occipital adenopathy present.    She has no cervical adenopathy.       Right: No supraclavicular adenopathy present.       Left: No supraclavicular adenopathy present.  Neurological: She is alert and oriented to  person, place, and time. She has normal strength and normal reflexes. No cranial nerve deficit. She exhibits normal muscle tone. Coordination and gait normal.  Skin: Skin is warm, dry and intact. No rash noted. She is not diaphoretic. No cyanosis or erythema. Nails show no clubbing.  Psychiatric: She has a normal mood and affect. Her speech is normal and behavior is normal. Judgment and thought content normal.          Assessment & Plan:  1. Annual physical exam Age appropriate anticipatory guidance  provided. She's not really interested in additional evaulation of the leg soreness. Encouraged to increase her water intake, as that may help. Let me know if she decides she'd like to pursue additional evaluation.  2. Screening-pulmonary TB RTC 48-72 hours for reading. - TB Skin Test   Fara Chute, PA-C Physician Assistant-Certified [facility]

## 2014-02-01 NOTE — Patient Instructions (Signed)
Keeping You Healthy  Get These Tests 1. Blood Pressure- Have your blood pressure checked once a year by your health care provider.  Normal blood pressure is 120/80. 2. Weight- Have your body mass index (BMI) calculated to screen for obesity.  BMI is measure of body fat based on height and weight.  You can also calculate your own BMI at GravelBags.it. 3. Cholesterol- Have your cholesterol checked every 5 years starting at age 28 then yearly starting at age 49. 34. Chlamydia, HIV, and other sexually transmitted diseases- Get screened every year until age 31, then within three months of each new sexual provider. 5. Pap Smear- Every 1-5 years; discuss with your health care provider. 6. Mammogram- Every year starting at age 5-50  Take these medicines  Calcium with Vitamin D-Your body needs 1200 mg of Calcium each day and [phone] IU of Vitamin D daily.  Your body can only absorb 500 mg of Calcium at a time so Calcium must be taken in 2 or 3 divided doses throughout the day.  Multivitamin with folic acid- Once daily if it is possible for you to become pregnant.  Get these Immunizations  Gardasil-Series of three doses; prevents HPV related illness such as genital warts and cervical cancer.  Menactra-Single dose; prevents meningitis.  Tetanus shot- Every 10 years.  Flu shot-Every year.  Take these steps 1. Do not smoke-Your healthcare provider can help you quit.  For tips on how to quit go to www.smokefree.gov or call 1-800 QUITNOW. 2. Be physically active- Exercise 5 days a week for at least 30 minutes.  If you are not already physically active, start slow and gradually work up to 30 minutes of moderate physical activity.  Examples of moderate activity include walking briskly, dancing, swimming, bicycling, etc. 3. Breast Cancer- A self breast exam every month is important for early detection of breast cancer.  For more information and instruction on self breast exams, ask your  healthcare provider or https://www.patel.info/. 4. Eat a healthy diet- Eat a variety of healthy foods such as fruits, vegetables, whole grains, low fat milk, low fat cheeses, yogurt, lean meats, poultry and fish, beans, nuts, tofu, etc.  For more information go to www. Thenutritionsource.org 5. Drink alcohol in moderation- Limit alcohol intake to one drink or less per day. Never drink and drive. 6. Depression- Your emotional health is as important as your physical health.  If you're feeling down or losing interest in things you normally enjoy please talk to your healthcare provider about being screened for depression. 7. Dental visit- Brush and floss your teeth twice daily; visit your dentist twice a year. 8. Eye doctor- Get an eye exam at least every 2 years. 9. Helmet use- Always wear a helmet when riding a bicycle, motorcycle, rollerblading or skateboarding. 21. Safe sex- If you may be exposed to sexually transmitted infections, use a condom. 11. Seat belts- Seat belts can save your live; always wear one. 12. Smoke/Carbon Monoxide detectors- These detectors need to be installed on the appropriate level of your home. Replace batteries at least once a year. 13. Skin cancer- When out in the sun please cover up and use sunscreen 15 SPF or higher. 14. Violence- If anyone is threatening or hurting you, please tell your healthcare provider.

## 2014-02-03 ENCOUNTER — Other Ambulatory Visit: Payer: Self-pay

## 2014-02-04 ENCOUNTER — Ambulatory Visit (INDEPENDENT_AMBULATORY_CARE_PROVIDER_SITE_OTHER): Payer: 59 | Admitting: *Deleted

## 2014-02-04 DIAGNOSIS — Z111 Encounter for screening for respiratory tuberculosis: Secondary | ICD-10-CM

## 2014-02-04 DIAGNOSIS — Z7689 Persons encountering health services in other specified circumstances: Secondary | ICD-10-CM

## 2014-02-04 LAB — TB SKIN TEST
Induration: 0 mm
TB SKIN TEST: NEGATIVE

## 2014-02-04 NOTE — Progress Notes (Signed)
   Subjective:    Patient ID: Teresa Brennan, female    DOB: 07-20-1985, 29 y.o.   MRN: [id number]  HPI Pt is here for PPD reading. She is within the appropriate 48-72 hr time window. She had it placed in her left forearm at 9:01 am, 02/01/14.   Review of Systems     Objective:   Physical Exam  Results for orders placed or performed in visit on 02/01/14  TB Skin Test  Result Value Ref Range   TB Skin Test Negative    Induration 0 mm          Assessment & Plan:

## 2014-02-05 LAB — CYTOLOGY - PAP

## 2014-03-11 ENCOUNTER — Telehealth: Payer: Self-pay | Admitting: Internal Medicine

## 2014-03-11 NOTE — Telephone Encounter (Signed)
, °

## 2014-04-10 ENCOUNTER — Other Ambulatory Visit: Payer: Self-pay | Admitting: Medical Oncology

## 2014-04-10 DIAGNOSIS — D649 Anemia, unspecified: Secondary | ICD-10-CM

## 2014-04-13 ENCOUNTER — Encounter: Payer: Self-pay | Admitting: Internal Medicine

## 2014-04-13 ENCOUNTER — Ambulatory Visit (HOSPITAL_BASED_OUTPATIENT_CLINIC_OR_DEPARTMENT_OTHER): Payer: 59 | Admitting: Internal Medicine

## 2014-04-13 ENCOUNTER — Other Ambulatory Visit: Payer: 59

## 2014-04-13 ENCOUNTER — Telehealth: Payer: Self-pay | Admitting: Internal Medicine

## 2014-04-13 ENCOUNTER — Ambulatory Visit: Payer: 59

## 2014-04-13 ENCOUNTER — Other Ambulatory Visit (HOSPITAL_BASED_OUTPATIENT_CLINIC_OR_DEPARTMENT_OTHER): Payer: 59

## 2014-04-13 VITALS — BP 120/79 | Temp 98.6°F | Ht 66.0 in | Wt 168.6 lb

## 2014-04-13 DIAGNOSIS — E538 Deficiency of other specified B group vitamins: Secondary | ICD-10-CM

## 2014-04-13 DIAGNOSIS — D509 Iron deficiency anemia, unspecified: Secondary | ICD-10-CM

## 2014-04-13 DIAGNOSIS — D510 Vitamin B12 deficiency anemia due to intrinsic factor deficiency: Secondary | ICD-10-CM

## 2014-04-13 DIAGNOSIS — D649 Anemia, unspecified: Secondary | ICD-10-CM

## 2014-04-13 DIAGNOSIS — D500 Iron deficiency anemia secondary to blood loss (chronic): Secondary | ICD-10-CM

## 2014-04-13 LAB — COMPREHENSIVE METABOLIC PANEL (CC13)
ALT: 15 U/L (ref 0–55)
ANION GAP: 9 meq/L (ref 3–11)
AST: 18 U/L (ref 5–34)
Albumin: 3.5 g/dL (ref 3.5–5.0)
Alkaline Phosphatase: 72 U/L (ref 40–150)
BILIRUBIN TOTAL: 0.2 mg/dL (ref 0.20–1.20)
BUN: 7 mg/dL (ref 7.0–26.0)
CO2: 23 meq/L (ref 22–29)
CREATININE: 0.8 mg/dL (ref 0.6–1.1)
Calcium: 8.9 mg/dL (ref 8.4–10.4)
Chloride: 107 mEq/L (ref 98–109)
GLUCOSE: 75 mg/dL (ref 70–140)
Potassium: 3.7 mEq/L (ref 3.5–5.1)
Sodium: 139 mEq/L (ref 136–145)
Total Protein: 7.3 g/dL (ref 6.4–8.3)

## 2014-04-13 LAB — CBC WITH DIFFERENTIAL/PLATELET
BASO%: 1.1 % (ref 0.0–2.0)
Basophils Absolute: 0.1 10*3/uL (ref 0.0–0.1)
EOS%: 1.4 % (ref 0.0–7.0)
Eosinophils Absolute: 0.1 10*3/uL (ref 0.0–0.5)
HCT: 39.2 % (ref 34.8–46.6)
HGB: 12.3 g/dL (ref 11.6–15.9)
LYMPH#: 2.4 10*3/uL (ref 0.9–3.3)
LYMPH%: 37.8 % (ref 14.0–49.7)
MCH: 24.6 pg — ABNORMAL LOW (ref 25.1–34.0)
MCHC: 31.4 g/dL — AB (ref 31.5–36.0)
MCV: 78.2 fL — ABNORMAL LOW (ref 79.5–101.0)
MONO#: 0.5 10*3/uL (ref 0.1–0.9)
MONO%: 8.3 % (ref 0.0–14.0)
NEUT#: 3.2 10*3/uL (ref 1.5–6.5)
NEUT%: 51.4 % (ref 38.4–76.8)
Platelets: 375 10*3/uL (ref 145–400)
RBC: 5.02 10*6/uL (ref 3.70–5.45)
RDW: 15.6 % — ABNORMAL HIGH (ref 11.2–14.5)
WBC: 6.3 10*3/uL (ref 3.9–10.3)

## 2014-04-13 NOTE — Progress Notes (Signed)
[place] [phone]   [phone]  OFFICE PROGRESS NOTE  Delice Lesch, MD [address] [place] [address]  DIAGNOSIS:  1) iron deficiency anemia 2) pernicious anemia  PRIOR THERAPY:  CURRENT THERAPY: 1) vitamin B-12 1000 g intramuscular on monthly basis. 2) ferrous sulfate 325 mg by mouth 2-3 times a week.  INTERVAL HISTORY: Teresa Brennan 29 y.o. female returns to the clinic today for follow-up visit. She is a former patient of Dr. Juliann Mule and Dr. Lona Kettle. She is here to establish care with me. The patient is feeling fine today except for mild fatigue. She takes her oral iron tablet around 2 times a week. She is also not compliant with the vitamin B-12 injection and her last injection was 2 months ago. She denied having any bleeding issues and she is currently on Ortho Micronor contraceptive pills and her last menstrual period was 4 months ago. She denied having any significant chest pain, shortness of breath, cough or hemoptysis. The patient denied having any significant nausea or vomiting, melena or hematochezia. She denied having any significant headache or visual changes. She had repeat CBC and iron study performed earlier today and she is here for evaluation and discussion of her lab results.  MEDICAL HISTORY: Past Medical History  Diagnosis Date  . Ovarian tumor   . Headache(784.0)   . Anemia   . History of fatigue 03/10/11  . Fibroid   . Irregular menses   . Migraine   . Painful intercourse 11/15/10  . Dyspareunia, female   . Asthma     ALLERGIES:  has No Known Allergies.  MEDICATIONS:  Current Outpatient Prescriptions  Medication Sig Dispense Refill  . cyanocobalamin (,VITAMIN B-12,) 1000 MCG/ML injection Inject 1 mL (1,000 mcg total) into the muscle once. EVERY 30 DAYS. 10 mL 0  . norethindrone (ORTHO MICRONOR) 0.35 MG tablet Take 1 tablet (0.35 mg total) by mouth daily. 1 Package 11   No current  facility-administered medications for this visit.    SURGICAL HISTORY:  Past Surgical History  Procedure Laterality Date  . Hysteroscopy    . Cesarean section N/A 07/18/2013    Procedure: CESAREAN SECTION;  Surgeon: Ena Dawley, MD;  Location: [facility];  Service: Obstetrics;  Laterality: N/A;    REVIEW OF SYSTEMS:  A comprehensive review of systems was negative except for: Constitutional: positive for fatigue   PHYSICAL EXAMINATION: General appearance: alert, cooperative and fatigued Head: Normocephalic, without obvious abnormality, atraumatic Neck: no adenopathy, no JVD, supple, symmetrical, trachea midline and thyroid not enlarged, symmetric, no tenderness/mass/nodules Lymph nodes: Cervical, supraclavicular, and axillary nodes normal. Resp: clear to auscultation bilaterally Back: symmetric, no curvature. ROM normal. No CVA tenderness. Cardio: regular rate and rhythm, S1, S2 normal, no murmur, click, rub or gallop GI: soft, non-tender; bowel sounds normal; no masses,  no organomegaly Extremities: extremities normal, atraumatic, no cyanosis or edema  ECOG PERFORMANCE STATUS: 1 - Symptomatic but completely ambulatory  Blood pressure 120/79, temperature 98.6 F (37 C), temperature source Oral, height 5\' 6"  (1.676 m), weight 168 lb 9.6 oz (76.476 kg), SpO2 100 %, not currently breastfeeding.  LABORATORY DATA: Lab Results  Component Value Date   WBC 6.3 04/13/2014   HGB 12.3 04/13/2014   HCT 39.2 04/13/2014   MCV 78.2* 04/13/2014   PLT 375 04/13/2014      Chemistry      Component Value Date/Time   NA 138 11/27/2013 1459   NA 137 07/05/2011  0949   K 3.8 11/27/2013 1459   K 4.1 07/05/2011 0949   CL 107 01/03/2012 0815   CL 104 07/05/2011 0949   CO2 24 11/27/2013 1459   CO2 25 07/05/2011 0949   BUN 10.0 11/27/2013 1459   BUN 11 07/05/2011 0949   CREATININE 0.9 11/27/2013 1459   CREATININE 0.89 07/05/2011 0949      Component Value Date/Time   CALCIUM 8.9 11/27/2013  1459   CALCIUM 9.3 07/05/2011 0949   ALKPHOS 74 11/27/2013 1459   ALKPHOS 79 02/17/2011 1509   AST 16 11/27/2013 1459   AST 19 02/17/2011 1509   ALT 13 11/27/2013 1459   ALT 19 02/17/2011 1509   BILITOT 0.22 11/27/2013 1459   BILITOT 0.5 02/17/2011 1509       RADIOGRAPHIC STUDIES: No results found.  ASSESSMENT AND PLAN: This is a very pleasant 29 years old African-American female with history of pernicious anemia currently on vitamin B 12 injection in addition to history of iron deficiency anemia and she is currently on oral iron tablets. The patient is doing fine except for mild fatigue. She has improvement in her hemoglobin and hematocrit over the last few months. Her hemoglobin and hematocrit are within the normal range. Her iron study and ferritin are still pending. I recommended for the patient to continue her current treatment with oral iron tablet as well as the vitamin B 12 injection. I will see the patient back for follow-up visit in 6 months with repeat CBC, iron study and ferritin in addition to vitamin M-76 and folic acid.  The patient voices understanding of current disease status and treatment options and is in agreement with the current care plan.  All questions were answered. The patient knows to call the clinic with any problems, questions or concerns. We can certainly see the patient much sooner if necessary.  Disclaimer: This note was dictated with voice recognition software. Similar sounding words can inadvertently be transcribed and may not be corrected upon review.

## 2014-04-13 NOTE — Telephone Encounter (Signed)
Gave avs & calendar for September °

## 2014-04-14 ENCOUNTER — Telehealth: Payer: Self-pay | Admitting: Medical Oncology

## 2014-04-14 DIAGNOSIS — D509 Iron deficiency anemia, unspecified: Secondary | ICD-10-CM

## 2014-04-14 LAB — IRON AND TIBC CHCC
%SAT: 8 % — ABNORMAL LOW (ref 21–57)
Iron: 36 ug/dL — ABNORMAL LOW (ref 41–142)
TIBC: 465 ug/dL — ABNORMAL HIGH (ref 236–444)
UIBC: 429 ug/dL — ABNORMAL HIGH (ref 120–384)

## 2014-04-14 LAB — FERRITIN CHCC: FERRITIN: 11 ng/mL (ref 9–269)

## 2014-04-14 MED ORDER — FERROUS SULFATE 325 (65 FE) MG PO TBEC: 325.0000 mg | DELAYED_RELEASE_TABLET | Freq: Every day | ORAL | Status: DC

## 2014-04-14 NOTE — Progress Notes (Signed)
Quick Note:  Call patient with the result and She needs to take her oral iron daily or come for IV feraheme infusion. ______

## 2014-04-14 NOTE — Telephone Encounter (Signed)
Pt notified. She wants to take oral iron so I called in rx for daily iron.I instructed her to cal back if she cannot tolerated the oral iron.

## 2014-04-14 NOTE — Telephone Encounter (Signed)
-----   Message from Curt Bears, MD sent at 04/14/2014  1:07 PM EDT ----- Call patient with the result and She needs to take her oral iron daily or come for IV feraheme infusion.

## 2014-04-24 ENCOUNTER — Ambulatory Visit (INDEPENDENT_AMBULATORY_CARE_PROVIDER_SITE_OTHER): Payer: 59 | Admitting: Physician Assistant

## 2014-04-24 VITALS — BP 119/87 | HR 112 | Temp 98.1°F | Resp 14 | Ht 66.5 in | Wt 166.8 lb

## 2014-04-24 DIAGNOSIS — N898 Other specified noninflammatory disorders of vagina: Secondary | ICD-10-CM | POA: Diagnosis not present

## 2014-04-24 DIAGNOSIS — N760 Acute vaginitis: Secondary | ICD-10-CM

## 2014-04-24 DIAGNOSIS — J069 Acute upper respiratory infection, unspecified: Secondary | ICD-10-CM | POA: Diagnosis not present

## 2014-04-24 DIAGNOSIS — B9689 Other specified bacterial agents as the cause of diseases classified elsewhere: Secondary | ICD-10-CM

## 2014-04-24 DIAGNOSIS — A499 Bacterial infection, unspecified: Secondary | ICD-10-CM | POA: Diagnosis not present

## 2014-04-24 LAB — POCT WET PREP WITH KOH
Clue Cells Wet Prep HPF POC: 75
KOH Prep POC: NEGATIVE
TRICHOMONAS UA: NEGATIVE
Yeast Wet Prep HPF POC: NEGATIVE

## 2014-04-24 MED ORDER — BENZONATATE 100 MG PO CAPS: 100.0000 mg | ORAL_CAPSULE | Freq: Three times a day (TID) | ORAL | Status: DC | PRN

## 2014-04-24 MED ORDER — FLUCONAZOLE 150 MG PO TABS: 150.0000 mg | ORAL_TABLET | Freq: Once | ORAL | Status: DC

## 2014-04-24 MED ORDER — CLINDAMYCIN HCL 300 MG PO CAPS: 300.0000 mg | ORAL_CAPSULE | Freq: Two times a day (BID) | ORAL | Status: DC

## 2014-04-24 MED ORDER — MAGIC MOUTHWASH W/LIDOCAINE: 10.0000 mL | ORAL | Status: DC | PRN

## 2014-04-24 MED ORDER — AZITHROMYCIN 250 MG PO TABS: ORAL_TABLET | ORAL | Status: DC

## 2014-04-24 MED ORDER — METRONIDAZOLE 500 MG PO TABS: 500.0000 mg | ORAL_TABLET | Freq: Two times a day (BID) | ORAL | Status: DC

## 2014-04-24 NOTE — Patient Instructions (Signed)
Bacterial Vaginosis Bacterial vaginosis is a vaginal infection that occurs when the normal balance of bacteria in the vagina is disrupted. It results from an overgrowth of certain bacteria. This is the most common vaginal infection in women of childbearing age. Treatment is important to prevent complications, especially in pregnant women, as it can cause a premature delivery. CAUSES  Bacterial vaginosis is caused by an increase in harmful bacteria that are normally present in smaller amounts in the vagina. Several different kinds of bacteria can cause bacterial vaginosis. However, the reason that the condition develops is not fully understood. RISK FACTORS Certain activities or behaviors can put you at an increased risk of developing bacterial vaginosis, including:  Having a new sex partner or multiple sex partners.  Douching.  Using an intrauterine device (IUD) for contraception. Women do not get bacterial vaginosis from toilet seats, bedding, swimming pools, or contact with objects around them. SIGNS AND SYMPTOMS  Some women with bacterial vaginosis have no signs or symptoms. Common symptoms include:  Grey vaginal discharge.  A fishlike odor with discharge, especially after sexual intercourse.  Itching or burning of the vagina and vulva.  Burning or pain with urination. DIAGNOSIS  Your health care provider will take a medical history and examine the vagina for signs of bacterial vaginosis. A sample of vaginal fluid may be taken. Your health care provider will look at this sample under a microscope to check for bacteria and abnormal cells. A vaginal pH test may also be done.  TREATMENT  Bacterial vaginosis may be treated with antibiotic medicines. These may be given in the form of a pill or a vaginal cream. A second round of antibiotics may be prescribed if the condition comes back after treatment.  HOME CARE INSTRUCTIONS   Only take over-the-counter or prescription medicines as  directed by your health care provider.  If antibiotic medicine was prescribed, take it as directed. Make sure you finish it even if you start to feel better.  Do not have sex until treatment is completed.  Tell all sexual partners that you have a vaginal infection. They should see their health care provider and be treated if they have problems, such as a mild rash or itching.  Practice safe sex by using condoms and only having one sex partner. SEEK MEDICAL CARE IF:   Your symptoms are not improving after 3 days of treatment.  You have increased discharge or pain.  You have a fever. MAKE SURE YOU:   Understand these instructions.  Will watch your condition.  Will get help right away if you are not doing well or get worse. FOR MORE INFORMATION  Centers for Disease Control and Prevention, Division of STD Prevention: www.cdc.gov/std American Sexual Health Association (ASHA): www.ashastd.org  Document Released: 01/09/2005 Document Revised: 10/30/2012 Document Reviewed: 08/21/2012 ExitCare Patient Information 2015 [employer]. This information is not intended to replace advice given to you by your health care provider. Make sure you discuss any questions you have with your health care provider.  

## 2014-04-24 NOTE — Progress Notes (Signed)
Subjective:    Patient ID: Teresa Brennan, female    DOB: [birth year], 29 y.o.   MRN: [id number]  HPI Patient presents for sinus infection and vaginal discharge.  Has had sinus pressure, productive cough, congestion, HA, and fatigue for 10 days. Has daughter in daycare that had similar sx that have resolved. Cough has been so bad it has caused vomiting. Has tried Sudafed, robitussin, and mucinex without relief. No h/o allergies, but had childhood asthma. Is not a smoker. NKDA  Has had fishy smelling, watery discharge has been present for 2 weeks. Denies urinary sx, vaginal bleeding, vaginal/pelvic/abdominal pain. Has monogamous female partner of 1 year and they use condoms sometimes. Uses seasonale birth control. No h/o STDs. LMP 02/2014 was normal. Last pap 01/2014 was abnormal, but has f/u with [facility] [place] [facility].  Review of Systems  Constitutional: Positive for diaphoresis and fatigue. Negative for fever and chills.  HENT: Positive for congestion (at night), sinus pressure and sore throat. Negative for ear discharge, ear pain, sneezing and trouble swallowing.   Eyes: Negative for pain, discharge and itching.  Respiratory: Positive for cough (yellowish clear). Negative for shortness of breath and wheezing.   Gastrointestinal: Positive for vomiting. Negative for nausea.  Genitourinary: Positive for vaginal discharge (fishy odor and more than usual, any thin). Negative for dysuria, urgency, frequency, hematuria, vaginal bleeding, difficulty urinating, genital sores, vaginal pain, menstrual problem, pelvic pain and dyspareunia.  Musculoskeletal: Negative for back pain.  Neurological: Positive for headaches. Negative for dizziness.       Objective:   Physical Exam  Constitutional: She is oriented to person, place, and time. She appears well-developed and well-nourished. No distress.  Blood pressure 119/87, pulse 112, temperature 98.1 F (36.7 C), temperature source Oral, resp. rate 14,  height 5' 6.5" (1.689 m), weight 166 lb 12.8 oz (75.66 kg), last menstrual period 03/02/2014, SpO2 100 %, not currently breastfeeding.  HENT:  Head: Normocephalic and atraumatic.  Right Ear: External ear normal.  Left Ear: External ear normal.  Mouth/Throat: Oropharynx is clear and moist. No oropharyngeal exudate.  Eyes: Conjunctivae are normal. Pupils are equal, round, and reactive to light. Right eye exhibits no discharge. Left eye exhibits no discharge. No scleral icterus.  Neck: Neck supple. No thyromegaly present.  Cardiovascular: Normal rate, regular rhythm and normal heart sounds.  Exam reveals no gallop and no friction rub.   No murmur heard. Pulmonary/Chest: Effort normal and breath sounds normal. No respiratory distress. She has no wheezes. She has no rales.  Abdominal: Soft. Bowel sounds are normal. She exhibits no distension and no mass. There is no tenderness. There is no rebound and no guarding.  Genitourinary: Vaginal discharge (thin, watery discharge that is white in color and has fish-like odor) found.  Lymphadenopathy:    She has cervical adenopathy.  Neurological: She is alert and oriented to person, place, and time.  Skin: She is not diaphoretic.   Results for orders placed or performed in visit on 04/24/14  POCT Wet Prep with KOH  Result Value Ref Range   Trichomonas, UA Negative    Clue Cells Wet Prep HPF POC 75%    Epithelial Wet Prep HPF POC 3-5    Yeast Wet Prep HPF POC neg    Bacteria Wet Prep HPF POC 2+    RBC Wet Prep HPF POC 0-1    WBC Wet Prep HPF POC 3-5    KOH Prep POC Negative        Assessment & Plan:  1. Vaginal discharge 2. Bacterial vaginosis Given diflucan in case of yeast infection since on 2 different antibiotics. Does not want to take metronidazole due to side effects of N/V. - POCT Wet Prep with KOH - clindamycin (CLEOCIN) 300 MG capsule; Take 1 capsule (300 mg total) by mouth 2 (two) times daily.  Dispense: 14 capsule; Refill: 0  3.  Acute upper respiratory infection Drink plenty of fluid.  - azithromycin (ZITHROMAX) 250 MG tablet; Take 2 tabs PO x 1 dose, then 1 tab PO QD x 4 days  Dispense: 6 tablet; Refill: 0 - benzonatate (TESSALON) 100 MG capsule; Take 1-2 capsules (100-200 mg total) by mouth 3 (three) times daily as needed for cough.  Dispense: 40 capsule; Refill: 0 - Alum & Mag Hydroxide-Simeth (MAGIC MOUTHWASH W/LIDOCAINE) SOLN; Take 10 mLs by mouth every 2 (two) hours as needed for mouth pain.  Dispense: 360 mL; Refill: 0   Sheikh Leverich PA-C  [facility] 04/24/2014 6:55 PM

## 2014-05-08 ENCOUNTER — Emergency Department (HOSPITAL_COMMUNITY)
Admission: EM | Admit: 2014-05-08 | Discharge: 2014-05-09 | Disposition: A | Discharge status: Home / Self Care | Payer: 59 | Attending: Emergency Medicine | Admitting: Emergency Medicine

## 2014-05-08 ENCOUNTER — Emergency Department (HOSPITAL_COMMUNITY): Payer: 59

## 2014-05-08 ENCOUNTER — Encounter (HOSPITAL_COMMUNITY): Payer: Self-pay | Admitting: Emergency Medicine

## 2014-05-08 DIAGNOSIS — R000 Tachycardia, unspecified: Secondary | ICD-10-CM | POA: Diagnosis not present

## 2014-05-08 DIAGNOSIS — Z792 Long term (current) use of antibiotics: Secondary | ICD-10-CM | POA: Diagnosis not present

## 2014-05-08 DIAGNOSIS — Z8679 Personal history of other diseases of the circulatory system: Secondary | ICD-10-CM | POA: Insufficient documentation

## 2014-05-08 DIAGNOSIS — Z86018 Personal history of other benign neoplasm: Secondary | ICD-10-CM | POA: Diagnosis not present

## 2014-05-08 DIAGNOSIS — D649 Anemia, unspecified: Secondary | ICD-10-CM | POA: Diagnosis not present

## 2014-05-08 DIAGNOSIS — Z79899 Other long term (current) drug therapy: Secondary | ICD-10-CM | POA: Diagnosis not present

## 2014-05-08 DIAGNOSIS — J069 Acute upper respiratory infection, unspecified: Secondary | ICD-10-CM | POA: Insufficient documentation

## 2014-05-08 DIAGNOSIS — Z8742 Personal history of other diseases of the female genital tract: Secondary | ICD-10-CM | POA: Diagnosis not present

## 2014-05-08 DIAGNOSIS — Z3202 Encounter for pregnancy test, result negative: Secondary | ICD-10-CM | POA: Diagnosis not present

## 2014-05-08 DIAGNOSIS — J45909 Unspecified asthma, uncomplicated: Secondary | ICD-10-CM | POA: Diagnosis not present

## 2014-05-08 DIAGNOSIS — R05 Cough: Secondary | ICD-10-CM | POA: Diagnosis present

## 2014-05-08 LAB — CBC WITH DIFFERENTIAL/PLATELET
Basophils Absolute: 0 10*3/uL (ref 0.0–0.1)
Basophils Relative: 0 % (ref 0–1)
EOS ABS: 0 10*3/uL (ref 0.0–0.7)
Eosinophils Relative: 0 % (ref 0–5)
HCT: 35.7 % — ABNORMAL LOW (ref 36.0–46.0)
HEMOGLOBIN: 11.6 g/dL — AB (ref 12.0–15.0)
LYMPHS ABS: 0.5 10*3/uL — AB (ref 0.7–4.0)
LYMPHS PCT: 9 % — AB (ref 12–46)
MCH: 25.5 pg — AB (ref 26.0–34.0)
MCHC: 32.5 g/dL (ref 30.0–36.0)
MCV: 78.5 fL (ref 78.0–100.0)
MONOS PCT: 13 % — AB (ref 3–12)
Monocytes Absolute: 0.7 10*3/uL (ref 0.1–1.0)
NEUTROS ABS: 4.3 10*3/uL (ref 1.7–7.7)
Neutrophils Relative %: 78 % — ABNORMAL HIGH (ref 43–77)
PLATELETS: 270 10*3/uL (ref 150–400)
RBC: 4.55 MIL/uL (ref 3.87–5.11)
RDW: 14.8 % (ref 11.5–15.5)
WBC: 5.5 10*3/uL (ref 4.0–10.5)

## 2014-05-08 LAB — RAPID STREP SCREEN (MED CTR MEBANE ONLY): Streptococcus, Group A Screen (Direct): NEGATIVE

## 2014-05-08 MED ORDER — ACETAMINOPHEN 325 MG PO TABS
650.0000 mg | ORAL_TABLET | Freq: Four times a day (QID) | ORAL | Status: DC | PRN
  Administered 2014-05-08: 650 mg via ORAL
  Filled 2014-05-08: qty 2

## 2014-05-08 MED ORDER — BENZONATATE 100 MG PO CAPS
100.0000 mg | ORAL_CAPSULE | Freq: Once | ORAL | Status: AC
  Administered 2014-05-08: 100 mg via ORAL
  Filled 2014-05-08: qty 1

## 2014-05-08 MED ORDER — SODIUM CHLORIDE 0.9 % IV BOLUS (SEPSIS)
1000.0000 mL | Freq: Once | INTRAVENOUS | Status: AC
Start: 2014-05-08 — End: 2014-05-08
  Administered 2014-05-08: 1000 mL via INTRAVENOUS

## 2014-05-08 MED ORDER — ALBUTEROL SULFATE (2.5 MG/3ML) 0.083% IN NEBU
2.5000 mg | INHALATION_SOLUTION | Freq: Once | RESPIRATORY_TRACT | Status: AC
  Administered 2014-05-08: 2.5 mg via RESPIRATORY_TRACT
  Filled 2014-05-08: qty 3

## 2014-05-08 MED ORDER — ACETAMINOPHEN 500 MG PO TABS: 1000.0000 mg | ORAL_TABLET | Freq: Three times a day (TID) | ORAL | Status: DC | PRN

## 2014-05-08 MED ORDER — IBUPROFEN 800 MG PO TABS
800.0000 mg | ORAL_TABLET | Freq: Once | ORAL | Status: AC
  Administered 2014-05-08: 800 mg via ORAL
  Filled 2014-05-08: qty 4

## 2014-05-08 MED ORDER — BENZONATATE 100 MG PO CAPS: 100.0000 mg | ORAL_CAPSULE | Freq: Three times a day (TID) | ORAL | Status: DC

## 2014-05-08 MED ORDER — SODIUM CHLORIDE 0.9 % IV BOLUS (SEPSIS)
1000.0000 mL | Freq: Once | INTRAVENOUS | Status: AC
  Administered 2014-05-08: 1000 mL via INTRAVENOUS

## 2014-05-08 NOTE — ED Notes (Signed)
PA at bedside.

## 2014-05-08 NOTE — ED Notes (Signed)
Pt c/o dry cough onset last night, SOB, dizziness, and R ear pain. Pt reports cough has been progressively worsening throughout the day and pt has been having increased difficulty catching her breath. Denies chest pain. Pt is tearful and having difficulty getting out full sentences.

## 2014-05-08 NOTE — ED Provider Notes (Signed)
CSN: [id number]     Arrival date & time 05/08/14  1534 History   First MD Initiated Contact with Patient 05/08/14 1542     Chief Complaint  Patient presents with  . Shortness of Breath  . Cough     (Consider location/radiation/quality/duration/timing/severity/associated sxs/prior Treatment) Patient is a 29 y.o. female presenting with shortness of breath and cough. The history is provided by the patient and a friend. No language interpreter was used.  Shortness of Breath Associated symptoms: cough   Associated symptoms: no abdominal pain, no fever, no vomiting and no wheezing   Cough Associated symptoms: shortness of breath   Associated symptoms: no fever and no wheezing   Ms. Teresa Brennan is a 29 y.o black female who presents for gradual onset productive cough and shortness of breath that began last night but worse today with a sore throat, right ear pain, and chills while at work. She took robitussin for cough with little relief. Coughing makes her chest hurt. History of asthma as a child.  She is not a smoker. She is on OCP's. She denies any abdominal pain, nausea, vomiting, urinary symptoms, or leg swelling.   Past Medical History  Diagnosis Date  . Ovarian tumor   . Headache(784.0)   . Anemia   . History of fatigue 03/10/11  . Fibroid   . Irregular menses   . Migraine   . Painful intercourse 11/15/10  . Dyspareunia, female   . Asthma    Past Surgical History  Procedure Laterality Date  . Hysteroscopy    . Cesarean section N/A 07/18/2013    Procedure: CESAREAN SECTION;  Surgeon: Ena Dawley, MD;  Location: [facility];  Service: Obstetrics;  Laterality: N/A;   Family History  Problem Relation Age of Onset  . Adopted: Yes  . Sickle cell trait Daughter    History  Substance Use Topics  . Smoking status: Never Smoker   . Smokeless tobacco: Never Used  . Alcohol Use: No   OB History    Gravida Para Term Preterm AB TAB SAB Ectopic Multiple Living   1 1 1       1      Review  of Systems  Constitutional: Negative for fever.  HENT: Negative for trouble swallowing.   Respiratory: Positive for cough and shortness of breath. Negative for wheezing.   Gastrointestinal: Negative for nausea, vomiting and abdominal pain.  Genitourinary: Negative for dysuria and difficulty urinating.      Allergies  Review of patient's allergies indicates no known allergies.  Home Medications   Prior to Admission medications   Medication Sig Start Date End Date Taking? Authorizing Provider  Alum & Mag Hydroxide-Simeth (MAGIC MOUTHWASH W/LIDOCAINE) SOLN Take 10 mLs by mouth every 2 (two) hours as needed for mouth pain. 04/24/14  Yes Tishira R Brewington, PA-C  cyanocobalamin (,VITAMIN B-12,) 1000 MCG/ML injection Inject 1 mL (1,000 mcg total) into the muscle once. EVERY 30 DAYS. 12/09/13  Yes Aasim Lona Kettle, MD  ferrous sulfate 325 (65 FE) MG EC tablet Take 1 tablet (325 mg total) by mouth daily with breakfast. 04/14/14  Yes Curt Bears, MD  norethindrone (ORTHO MICRONOR) 0.35 MG tablet Take 1 tablet (0.35 mg total) by mouth daily. 07/21/13  Yes Farrel Gordon, CNM  acetaminophen (TYLENOL) 500 MG tablet Take 2 tablets (1,000 mg total) by mouth every 8 (eight) hours as needed. 05/08/14   Misaki Sozio Patel-Mills, PA-C  azithromycin (ZITHROMAX) 250 MG tablet Take 2 tabs PO x 1 dose, then 1 tab PO QD  x 4 days Patient not taking: Reported on 05/08/2014 04/24/14   Tishira R Brewington, PA-C  benzonatate (TESSALON) 100 MG capsule Take 1 capsule (100 mg total) by mouth every 8 (eight) hours. 05/08/14   Davene Jobin Patel-Mills, PA-C  clindamycin (CLEOCIN) 300 MG capsule Take 1 capsule (300 mg total) by mouth 2 (two) times daily. Patient not taking: Reported on 05/08/2014 04/24/14   Tishira R Brewington, PA-C  fluconazole (DIFLUCAN) 150 MG tablet Take 1 tablet (150 mg total) by mouth once. Repeat if needed Patient not taking: Reported on 05/08/2014 04/24/14   Tishira R Brewington, PA-C   BP 102/55 mmHg  Pulse 125   Temp(Src) 100.4 F (38 C) (Oral)  Resp 24  Ht 5\' 6"  (1.676 m)  Wt 170 lb (77.111 kg)  BMI 27.45 kg/m2  SpO2 95%  LMP 03/02/2014 (Approximate) Physical Exam  Constitutional: She is oriented to person, place, and time. She appears well-developed and well-nourished.  HENT:  Right Ear: External ear normal.  Left Ear: External ear normal.  Mouth/Throat: Uvula is midline and oropharynx is clear and moist. No oropharyngeal exudate, posterior oropharyngeal edema or tonsillar abscesses.  Eyes: Conjunctivae and EOM are normal.  Cardiovascular: Regular rhythm.  Tachycardia present.   Pulmonary/Chest: Effort normal and breath sounds normal. No respiratory distress. She has no wheezes.  Neurological: She is alert and oriented to person, place, and time.  Skin: Skin is warm and dry.  Nursing note and vitals reviewed.   ED Course  Procedures (including critical care time) Labs Review Labs Reviewed  D-DIMER, QUANTITATIVE - Abnormal; Notable for the following:    D-Dimer, Quant 0.50 (*)    All other components within normal limits  CBC WITH DIFFERENTIAL/PLATELET - Abnormal; Notable for the following:    Hemoglobin 11.6 (*)    HCT 35.7 (*)    MCH 25.5 (*)    Neutrophils Relative % 78 (*)    Lymphocytes Relative 9 (*)    Lymphs Abs 0.5 (*)    Monocytes Relative 13 (*)    All other components within normal limits  BASIC METABOLIC PANEL - Abnormal; Notable for the following:    Sodium 134 (*)    Potassium 3.3 (*)    CO2 18 (*)    Glucose, Bld 102 (*)    BUN <5 (*)    Calcium 8.0 (*)    All other components within normal limits  RAPID STREP SCREEN  CULTURE, GROUP A STREP  INFLUENZA PANEL BY PCR (TYPE A & B, H1N1)  POC URINE PREG, ED    Imaging Review Dg Chest 2 View  05/08/2014   CLINICAL DATA:  Pt c/o dry cough onset last night, SOB, dizziness, fever, and R ear pain. Pt reports cough has been progressively worsening throughout the day and pt has been having increased difficulty  catching her breath. Denies chest pain  EXAM: CHEST  2 VIEW  COMPARISON:  None.  FINDINGS: The heart size and mediastinal contours are within normal limits. Both lungs are clear. No pleural effusion or pneumothorax. The visualized skeletal structures are unremarkable.  IMPRESSION: No active cardiopulmonary disease.   Electronically Signed   By: Lajean Manes M.D.   On: 05/08/2014 16:37     EKG Interpretation   Date/Time:  Friday May 08 2014 15:38:44 EDT Ventricular Rate:  137 PR Interval:  130 QRS Duration: 64 QT Interval:  337 QTC Calculation: 509 R Axis:   72 Text Interpretation:  Sinus tachycardia Borderline T abnormalities,  diffuse leads ST elevation,  consider inferior injury Prolonged QT interval  Baseline wander in lead(s) I II aVR V1 No prior for comparison Confirmed  by Mingo Amber  MD, [place] ([id number]) on 05/08/2014 3:44:49 PM      MDM   Final diagnoses:  URI, acute  Patient presents for URI symptoms that began yesterday and worse today.  She has a fever of 103.2.  She was given tylenol and a breathing treatment. No wheezing on exam.   16:42 Patient states she is feeling much better. Her CXR is negative for pneumonia. Rapid strep is also negative. I did an influenza panel but it has not resulted.  I did explain to the patient that I would not have medically treated her any different. I do not think this is related to a PE. She is on birth control but all other risk factors are negative.  I think she is tachycardic secondary to fever and more recently due to breathing treatment.  21:43 Patient is still tachycardic in the 130's and hypotensive.  She is afebrile at 102.8 at bedside.   I have given her tessalon for cough and tylenol for fever. I odered labs and CTA.   I have spoke to and transferred the patient's care to [facility], PA-C.  The plan is to discharge the patient pending that her CTA is negative.        Ottie Glazier, PA-C 05/09/14 [place], MD 05/09/14  [phone]

## 2014-05-08 NOTE — ED Notes (Signed)
Awake. Verbally responsive. Resp even and unlabored. Occ nonproductive cough. Pt reported productive cough of white sputum at times.  ABC's intact. IV infusing NS at 954ml/hr without difficulty. Family at bedside.

## 2014-05-08 NOTE — ED Notes (Signed)
Patient transported to X-ray 

## 2014-05-08 NOTE — ED Notes (Signed)
Pt noted coughing and causing to have n/v. Resp even and unlabored. Noted occ nonproductive cough. ABC's intact. ST on monitor. IV infiltrated. Plans to restart. Family at bedside. Orvil Feil, [place] aware of ST.

## 2014-05-08 NOTE — ED Notes (Addendum)
Awake. Verbally responsive. A/O x4. Resp even and unlabored. No audible adventitious breath sounds noted. ABC's intact. ST on monitor at 128. IV infused NS at 921ml/hr without difficulty. Pt tolerated well. Pt ambulated to bathroom with steady gait. Family at bedside.

## 2014-05-08 NOTE — ED Notes (Signed)
Pt ambulated to bathroom with steady gait and returned to room.

## 2014-05-08 NOTE — ED Notes (Addendum)
Awake. Verbally responsive. A/O x4. Resp even and unlabored. No audible adventitious breath sounds noted. ABC's intact. ST on monitor at 131bpm. IV saline lock patent and intact. Family at bedside. Pt given apple juice to drink.

## 2014-05-09 ENCOUNTER — Emergency Department (HOSPITAL_COMMUNITY): Payer: 59

## 2014-05-09 LAB — BASIC METABOLIC PANEL
Anion gap: 5 (ref 5–15)
CALCIUM: 8 mg/dL — AB (ref 8.4–10.5)
CHLORIDE: 111 mmol/L (ref 96–112)
CO2: 18 mmol/L — ABNORMAL LOW (ref 19–32)
Creatinine, Ser: 0.82 mg/dL (ref 0.50–1.10)
GLUCOSE: 102 mg/dL — AB (ref 70–99)
POTASSIUM: 3.3 mmol/L — AB (ref 3.5–5.1)
Sodium: 134 mmol/L — ABNORMAL LOW (ref 135–145)

## 2014-05-09 LAB — INFLUENZA PANEL BY PCR (TYPE A & B)
H1N1 flu by pcr: NOT DETECTED
INFLAPCR: POSITIVE — AB
Influenza B By PCR: NEGATIVE

## 2014-05-09 LAB — POC URINE PREG, ED: Preg Test, Ur: NEGATIVE

## 2014-05-09 LAB — D-DIMER, QUANTITATIVE: D-Dimer, Quant: 0.5 ug/mL-FEU — ABNORMAL HIGH (ref 0.00–0.48)

## 2014-05-09 MED ORDER — IOHEXOL 350 MG/ML SOLN
100.0000 mL | Freq: Once | INTRAVENOUS | Status: AC | PRN
  Administered 2014-05-09: 100 mL via INTRAVENOUS

## 2014-05-09 NOTE — ED Provider Notes (Signed)
Chest CT angiogram was negative for PE. Is pending . Patient is discharged home with diagnosis of URI and tachycardia  Teresa Creamer, NP 05/09/14 0155  Debby Freiberg, MD 05/09/14 [phone]

## 2014-05-09 NOTE — ED Notes (Signed)
Awake. Verbally responsive. A/O x4. Resp even and unlabored. No audible adventitious breath sounds noted. ABC's intact. ST on monitor. Pt ambulated to BR with steady gait.

## 2014-05-09 NOTE — ED Notes (Signed)
Awake. Verbally responsive. A/O x4. Resp even and unlabored. No audible adventitious breath sounds noted. ABC's intact. ST on monitor. IV saline lock patent and intact. Family at bedside.

## 2014-05-09 NOTE — Discharge Instructions (Signed)
Upper Respiratory Infection, Adult Take tylenol or motrin for fever. Take cough medication as prescribed.  An upper respiratory infection (URI) is also sometimes known as the common cold. The upper respiratory tract includes the nose, sinuses, throat, trachea, and bronchi. Bronchi are the airways leading to the lungs. Most people improve within 1 week, but symptoms can last up to 2 weeks. A residual cough may last even longer.  CAUSES Many different viruses can infect the tissues lining the upper respiratory tract. The tissues become irritated and inflamed and often become very moist. Mucus production is also common. A cold is contagious. You can easily spread the virus to others by oral contact. This includes kissing, sharing a glass, coughing, or sneezing. Touching your mouth or nose and then touching a surface, which is then touched by another person, can also spread the virus. SYMPTOMS  Symptoms typically develop 1 to 3 days after you come in contact with a cold virus. Symptoms vary from person to person. They may include:  Runny nose.  Sneezing.  Nasal congestion.  Sinus irritation.  Sore throat.  Loss of voice (laryngitis).  Cough.  Fatigue.  Muscle aches.  Loss of appetite.  Headache.  Low-grade fever. DIAGNOSIS  You might diagnose your own cold based on familiar symptoms, since most people get a cold 2 to 3 times a year. Your caregiver can confirm this based on your exam. Most importantly, your caregiver can check that your symptoms are not due to another disease such as strep throat, sinusitis, pneumonia, asthma, or epiglottitis. Blood tests, throat tests, and X-rays are not necessary to diagnose a common cold, but they may sometimes be helpful in excluding other more serious diseases. Your caregiver will decide if any further tests are required. RISKS AND COMPLICATIONS  You may be at risk for a more severe case of the common cold if you smoke cigarettes, have chronic heart  disease (such as heart failure) or lung disease (such as asthma), or if you have a weakened immune system. The very young and very old are also at risk for more serious infections. Bacterial sinusitis, middle ear infections, and bacterial pneumonia can complicate the common cold. The common cold can worsen asthma and chronic obstructive pulmonary disease (COPD). Sometimes, these complications can require emergency medical care and may be life-threatening. PREVENTION  The best way to protect against getting a cold is to practice good hygiene. Avoid oral or hand contact with people with cold symptoms. Wash your hands often if contact occurs. There is no clear evidence that vitamin C, vitamin E, echinacea, or exercise reduces the chance of developing a cold. However, it is always recommended to get plenty of rest and practice good nutrition. TREATMENT  Treatment is directed at relieving symptoms. There is no cure. Antibiotics are not effective, because the infection is caused by a virus, not by bacteria. Treatment may include:  Increased fluid intake. Sports drinks offer valuable electrolytes, sugars, and fluids.  Breathing heated mist or steam (vaporizer or shower).  Eating chicken soup or other clear broths, and maintaining good nutrition.  Getting plenty of rest.  Using gargles or lozenges for comfort.  Controlling fevers with ibuprofen or acetaminophen as directed by your caregiver.  Increasing usage of your inhaler if you have asthma. Zinc gel and zinc lozenges, taken in the first 24 hours of the common cold, can shorten the duration and lessen the severity of symptoms. Pain medicines may help with fever, muscle aches, and throat pain. A variety of  non-prescription medicines are available to treat congestion and runny nose. Your caregiver can make recommendations and may suggest nasal or lung inhalers for other symptoms.  HOME CARE INSTRUCTIONS   Only take over-the-counter or prescription  medicines for pain, discomfort, or fever as directed by your caregiver.  Use a warm mist humidifier or inhale steam from a shower to increase air moisture. This may keep secretions moist and make it easier to breathe.  Drink enough water and fluids to keep your urine clear or pale yellow.  Rest as needed.  Return to work when your temperature has returned to normal or as your caregiver advises. You may need to stay home longer to avoid infecting others. You can also use a face mask and careful hand washing to prevent spread of the virus. SEEK MEDICAL CARE IF:   After the first few days, you feel you are getting worse rather than better.  You need your caregiver's advice about medicines to control symptoms.  You develop chills, worsening shortness of breath, or brown or red sputum. These may be signs of pneumonia.  You develop yellow or brown nasal discharge or pain in the face, especially when you bend forward. These may be signs of sinusitis.  You develop a fever, swollen neck glands, pain with swallowing, or white areas in the back of your throat. These may be signs of strep throat. SEEK IMMEDIATE MEDICAL CARE IF:   You have a fever.  You develop severe or persistent headache, ear pain, sinus pain, or chest pain.  You develop wheezing, a prolonged cough, cough up blood, or have a change in your usual mucus (if you have chronic lung disease).  You develop sore muscles or a stiff neck. Document Released: 07/05/2000 Document Revised: 04/03/2011 Document Reviewed: 04/16/2013 [facility] Patient Information 2015 [place], [place]. This information is not intended to replace advice given to you by your health care provider. Make sure you discuss any questions you have with your health care provider. Her x-ray and CT of your chest are both normal. The flu swab is pending. He will be notified if it is positive

## 2014-05-09 NOTE — ED Notes (Signed)
Patient transported to CT 

## 2014-05-11 LAB — CULTURE, GROUP A STREP: Strep A Culture: NEGATIVE

## 2014-09-14 ENCOUNTER — Telehealth: Payer: Self-pay | Admitting: Internal Medicine

## 2014-09-14 ENCOUNTER — Telehealth: Payer: Self-pay | Admitting: *Deleted

## 2014-09-14 NOTE — Telephone Encounter (Signed)
Pt called states " i;m really really tired, more than usual. I'd like to do the treatment for IV Iron, I don't think the pills are working." Pt has lab 9/19 MD visit 9/29. Will review with MD

## 2014-09-14 NOTE — Telephone Encounter (Signed)
Confirmed appointment change to next available 09/6 & 09/12

## 2014-09-14 NOTE — Telephone Encounter (Signed)
We can see her sooner if needed. Please reschedule the CBC and iron study before her visit. Thank you

## 2014-09-25 ENCOUNTER — Telehealth: Payer: Self-pay | Admitting: Internal Medicine

## 2014-09-25 NOTE — Telephone Encounter (Signed)
lvm for pt regarding to 9.12 appt moved to 9.28 due to MD out of the office....mailed pta ppt sched and letter

## 2014-09-29 ENCOUNTER — Other Ambulatory Visit: Payer: 59

## 2014-09-30 ENCOUNTER — Telehealth: Payer: Self-pay | Admitting: Internal Medicine

## 2014-09-30 ENCOUNTER — Other Ambulatory Visit: Payer: 59

## 2014-09-30 NOTE — Telephone Encounter (Signed)
returned call and r/s appt per pt request.....pt ok and aware of new d.t °

## 2014-10-05 ENCOUNTER — Ambulatory Visit: Payer: 59 | Admitting: Internal Medicine

## 2014-10-05 ENCOUNTER — Other Ambulatory Visit (HOSPITAL_BASED_OUTPATIENT_CLINIC_OR_DEPARTMENT_OTHER): Payer: 59

## 2014-10-05 DIAGNOSIS — D509 Iron deficiency anemia, unspecified: Secondary | ICD-10-CM

## 2014-10-05 DIAGNOSIS — E538 Deficiency of other specified B group vitamins: Secondary | ICD-10-CM | POA: Diagnosis not present

## 2014-10-05 DIAGNOSIS — D500 Iron deficiency anemia secondary to blood loss (chronic): Secondary | ICD-10-CM

## 2014-10-05 LAB — CBC WITH DIFFERENTIAL/PLATELET
BASO%: 1 % (ref 0.0–2.0)
BASOS ABS: 0 10*3/uL (ref 0.0–0.1)
EOS%: 1.8 % (ref 0.0–7.0)
Eosinophils Absolute: 0.1 10*3/uL (ref 0.0–0.5)
HEMATOCRIT: 41.9 % (ref 34.8–46.6)
HGB: 13.8 g/dL (ref 11.6–15.9)
LYMPH#: 1.6 10*3/uL (ref 0.9–3.3)
LYMPH%: 41.9 % (ref 14.0–49.7)
MCH: 27.1 pg (ref 25.1–34.0)
MCHC: 33 g/dL (ref 31.5–36.0)
MCV: 82.1 fL (ref 79.5–101.0)
MONO#: 0.5 10*3/uL (ref 0.1–0.9)
MONO%: 12.2 % (ref 0.0–14.0)
NEUT#: 1.7 10*3/uL (ref 1.5–6.5)
NEUT%: 43.1 % (ref 38.4–76.8)
Platelets: 261 10*3/uL (ref 145–400)
RBC: 5.1 10*6/uL (ref 3.70–5.45)
RDW: 13.3 % (ref 11.2–14.5)
WBC: 3.9 10*3/uL (ref 3.9–10.3)

## 2014-10-05 LAB — VITAMIN B12: Vitamin B-12: 399 pg/mL (ref 211–911)

## 2014-10-05 LAB — FOLATE: Folate: 8.1 ng/mL

## 2014-10-06 LAB — IRON AND TIBC CHCC
%SAT: 9 % — ABNORMAL LOW (ref 21–57)
Iron: 40 ug/dL — ABNORMAL LOW (ref 41–142)
TIBC: 436 ug/dL (ref 236–444)
UIBC: 396 ug/dL — ABNORMAL HIGH (ref 120–384)

## 2014-10-06 LAB — FERRITIN CHCC: FERRITIN: 16 ng/mL (ref 9–269)

## 2014-10-12 ENCOUNTER — Other Ambulatory Visit: Payer: 59

## 2014-10-19 ENCOUNTER — Ambulatory Visit: Payer: 59 | Admitting: Internal Medicine

## 2014-10-21 ENCOUNTER — Ambulatory Visit (HOSPITAL_BASED_OUTPATIENT_CLINIC_OR_DEPARTMENT_OTHER): Payer: 59 | Admitting: Internal Medicine

## 2014-10-21 ENCOUNTER — Telehealth: Payer: Self-pay | Admitting: Internal Medicine

## 2014-10-21 ENCOUNTER — Encounter: Payer: Self-pay | Admitting: Internal Medicine

## 2014-10-21 VITALS — BP 123/84 | HR 90 | Temp 98.5°F | Resp 17 | Ht 66.0 in | Wt 165.6 lb

## 2014-10-21 DIAGNOSIS — D509 Iron deficiency anemia, unspecified: Secondary | ICD-10-CM

## 2014-10-21 DIAGNOSIS — E538 Deficiency of other specified B group vitamins: Secondary | ICD-10-CM

## 2014-10-21 NOTE — Progress Notes (Signed)
[place] [phone]   [phone]  OFFICE PROGRESS NOTE  Delice Lesch, MD [address] [place] [address]  DIAGNOSIS:  1) iron deficiency anemia 2) pernicious anemia  PRIOR THERAPY:ferrous sulfate 325 mg by mouth 2-3 times a week.  CURRENT THERAPY: 1) Feraheme infusion 510 MG IV weekly 2 doses, first dose 10/30/2014. 2) vitamin B-12 1000 g intramuscular on monthly basis.   INTERVAL HISTORY: Teresa Brennan 29 y.o. female returns to the clinic today for follow-up visit. The patient continues to complain of increasing fatigue and feeling tired all the time. She had TSH in 2013 that was within the normal range. The patient has been on oral iron tablets 3 times a week and she cannot take it at regular basis because of gastric upset and constipation. She denied having any significant chest pain, shortness of breath, cough or hemoptysis. The patient denied having any significant nausea or vomiting, melena or hematochezia. She denied having any significant headache or visual changes. She had repeat CBC and iron study performed earlier today and she is here for evaluation and discussion of her lab results.  MEDICAL HISTORY: Past Medical History  Diagnosis Date  . Ovarian tumor   . Headache(784.0)   . Anemia   . History of fatigue 03/10/11  . Fibroid   . Irregular menses   . Migraine   . Painful intercourse 11/15/10  . Dyspareunia, female   . Asthma     ALLERGIES:  has No Known Allergies.  MEDICATIONS:  Current Outpatient Prescriptions  Medication Sig Dispense Refill  . acetaminophen (TYLENOL) 500 MG tablet Take 2 tablets (1,000 mg total) by mouth every 8 (eight) hours as needed. 15 tablet 0  . cyanocobalamin (,VITAMIN B-12,) 1000 MCG/ML injection Inject 1 mL (1,000 mcg total) into the muscle once. EVERY 30 DAYS. 10 mL 0  . ferrous sulfate 325 (65 FE) MG EC tablet Take 1 tablet (325 mg total) by mouth daily with  breakfast. (Patient not taking: Reported on 10/21/2014) 30 tablet 0   No current facility-administered medications for this visit.    SURGICAL HISTORY:  Past Surgical History  Procedure Laterality Date  . Hysteroscopy    . Cesarean section N/A 07/18/2013    Procedure: CESAREAN SECTION;  Surgeon: Ena Dawley, MD;  Location: [facility];  Service: Obstetrics;  Laterality: N/A;    REVIEW OF SYSTEMS:  A comprehensive review of systems was negative except for: Constitutional: positive for fatigue   PHYSICAL EXAMINATION: General appearance: alert, cooperative and fatigued Head: Normocephalic, without obvious abnormality, atraumatic Neck: no adenopathy, no JVD, supple, symmetrical, trachea midline and thyroid not enlarged, symmetric, no tenderness/mass/nodules Lymph nodes: Cervical, supraclavicular, and axillary nodes normal. Resp: clear to auscultation bilaterally Back: symmetric, no curvature. ROM normal. No CVA tenderness. Cardio: regular rate and rhythm, S1, S2 normal, no murmur, click, rub or gallop GI: soft, non-tender; bowel sounds normal; no masses,  no organomegaly Extremities: extremities normal, atraumatic, no cyanosis or edema  ECOG PERFORMANCE STATUS: 1 - Symptomatic but completely ambulatory  Blood pressure 123/84, pulse 90, temperature 98.5 F (36.9 C), temperature source Oral, resp. rate 17, height 5\' 6"  (1.676 m), weight 165 lb 9.6 oz (75.116 kg), SpO2 100 %, not currently breastfeeding.  LABORATORY DATA: Lab Results  Component Value Date   WBC 3.9 10/05/2014   HGB 13.8 10/05/2014   HCT 41.9 10/05/2014   MCV 82.1 10/05/2014   PLT 261 10/05/2014      Chemistry  Component Value Date/Time   NA 134* 05/08/2014 2340   NA 139 04/13/2014 1454   K 3.3* 05/08/2014 2340   K 3.7 04/13/2014 1454   CL 111 05/08/2014 2340   CL 107 01/03/2012 0815   CO2 18* 05/08/2014 2340   CO2 23 04/13/2014 1454   BUN <5* 05/08/2014 2340   BUN 7.0 04/13/2014 1454   CREATININE 0.82  05/08/2014 2340   CREATININE 0.8 04/13/2014 1454      Component Value Date/Time   CALCIUM 8.0* 05/08/2014 2340   CALCIUM 8.9 04/13/2014 1454   ALKPHOS 72 04/13/2014 1454   ALKPHOS 79 02/17/2011 1509   AST 18 04/13/2014 1454   AST 19 02/17/2011 1509   ALT 15 04/13/2014 1454   ALT 19 02/17/2011 1509   BILITOT 0.20 04/13/2014 1454   BILITOT 0.5 02/17/2011 1509       RADIOGRAPHIC STUDIES: No results found.  ASSESSMENT AND PLAN: This is a very pleasant 29 years old African-American female with history of pernicious anemia currently on vitamin B 12 injection in addition to history of iron deficiency anemia and she is currently on oral iron tablets. The patient continues to complain of increasing fatigue. Her CBC today showed normal hemoglobin and hematocrit but the patient has iron deficiency. I discussed with the patient continue with the oral iron tablets on daily basis versus consideration of intravenous iron infusion with Feraheme. The patient is interested in proceeding with the iron infusion. She will be treated with Feraheme 510 MG IV weekly for 2 doses. First dose on 10/30/2014. I also recommended for the patient to continue her current treatment with oral iron tablet as well as the vitamin B 12 injection. I will see the patient back for follow-up visit in 6 months with repeat CBC, iron study and ferritin in addition to vitamin H-85 and folic acid. She was advised to call immediately if she has any concerning symptoms in the interval. The patient voices understanding of current disease status and treatment options and is in agreement with the current care plan.  All questions were answered. The patient knows to call the clinic with any problems, questions or concerns. We can certainly see the patient much sooner if necessary.  Disclaimer: This note was dictated with voice recognition software. Similar sounding words can inadvertently be transcribed and may not be corrected upon  review.

## 2014-10-21 NOTE — Telephone Encounter (Signed)
Pt confirmed labs/Feraheme/ov per 09/28 POF, gave pt AVS and Calendar... KJ

## 2014-10-22 ENCOUNTER — Telehealth: Payer: Self-pay | Admitting: Internal Medicine

## 2014-10-22 NOTE — Telephone Encounter (Signed)
Lft msg for pt confirming infusion added for next 2 visits, mailed out schedule... KJ

## 2014-10-28 ENCOUNTER — Telehealth: Payer: Self-pay | Admitting: Internal Medicine

## 2014-10-28 NOTE — Telephone Encounter (Signed)
patient called in top check her iron appointment and to reschedule the time  anne

## 2014-10-30 ENCOUNTER — Ambulatory Visit: Payer: 59

## 2014-10-30 ENCOUNTER — Ambulatory Visit (HOSPITAL_BASED_OUTPATIENT_CLINIC_OR_DEPARTMENT_OTHER): Payer: 59

## 2014-10-30 VITALS — BP 107/70 | HR 87 | Temp 98.4°F | Resp 18

## 2014-10-30 DIAGNOSIS — D509 Iron deficiency anemia, unspecified: Secondary | ICD-10-CM | POA: Diagnosis not present

## 2014-10-30 MED ORDER — SODIUM CHLORIDE 0.9 % IV SOLN
510.0000 mg | Freq: Once | INTRAVENOUS | Status: AC
  Administered 2014-10-30: 510 mg via INTRAVENOUS
  Filled 2014-10-30: qty 17

## 2014-10-30 MED ORDER — SODIUM CHLORIDE 0.9 % IV SOLN
Freq: Once | INTRAVENOUS | Status: AC
  Administered 2014-10-30: 16:00:00 via INTRAVENOUS

## 2014-10-30 NOTE — Patient Instructions (Signed)

## 2014-11-06 ENCOUNTER — Ambulatory Visit (HOSPITAL_BASED_OUTPATIENT_CLINIC_OR_DEPARTMENT_OTHER): Payer: 59

## 2014-11-06 VITALS — BP 111/71 | HR 110 | Temp 98.1°F | Resp 16

## 2014-11-06 DIAGNOSIS — D509 Iron deficiency anemia, unspecified: Secondary | ICD-10-CM | POA: Diagnosis not present

## 2014-11-06 MED ORDER — SODIUM CHLORIDE 0.9 % IV SOLN
Freq: Once | INTRAVENOUS | Status: AC
  Administered 2014-11-06: 16:00:00 via INTRAVENOUS

## 2014-11-06 MED ORDER — SODIUM CHLORIDE 0.9 % IV SOLN
510.0000 mg | Freq: Once | INTRAVENOUS | Status: AC
  Administered 2014-11-06: 510 mg via INTRAVENOUS
  Filled 2014-11-06: qty 17

## 2014-11-06 NOTE — Patient Instructions (Signed)

## 2015-01-19 ENCOUNTER — Other Ambulatory Visit: Payer: Self-pay | Admitting: Hematology

## 2015-04-16 ENCOUNTER — Other Ambulatory Visit: Payer: 59

## 2015-04-21 ENCOUNTER — Ambulatory Visit: Payer: 59 | Admitting: Internal Medicine

## 2015-05-15 ENCOUNTER — Ambulatory Visit (INDEPENDENT_AMBULATORY_CARE_PROVIDER_SITE_OTHER): Payer: 59 | Admitting: Family Medicine

## 2015-05-15 VITALS — BP 122/78 | HR 87 | Temp 98.1°F | Resp 17 | Ht 66.0 in | Wt 181.0 lb

## 2015-05-15 DIAGNOSIS — Z98891 History of uterine scar from previous surgery: Secondary | ICD-10-CM

## 2015-05-15 DIAGNOSIS — Z0000 Encounter for general adult medical examination without abnormal findings: Secondary | ICD-10-CM | POA: Diagnosis not present

## 2015-05-15 DIAGNOSIS — M545 Low back pain, unspecified: Secondary | ICD-10-CM

## 2015-05-15 DIAGNOSIS — Z862 Personal history of diseases of the blood and blood-forming organs and certain disorders involving the immune mechanism: Secondary | ICD-10-CM | POA: Diagnosis not present

## 2015-05-15 DIAGNOSIS — Z8742 Personal history of other diseases of the female genital tract: Secondary | ICD-10-CM

## 2015-05-15 NOTE — Patient Instructions (Addendum)
We will let you know the results of your labs in a few days.  Continue to follow with your OB/GYN for your birth control  Return annually or as needed  If at some point your interested in clinical remission type programs, consider the global missions health conference in November in [place], and the [place] [facility] [employer] or [place] online  Back Exercises The following exercises strengthen the muscles that help to support the back. They also help to keep the lower back flexible. Doing these exercises can help to prevent back pain or lessen existing pain. If you have back pain or discomfort, try doing these exercises 2-3 times each day or as told by your health care provider. When the pain goes away, do them once each day, but increase the number of times that you repeat the steps for each exercise (do more repetitions). If you do not have back pain or discomfort, do these exercises once each day or as told by your health care provider. EXERCISES Single Knee to Chest Repeat these steps 3-5 times for each leg: 1. Lie on your back on a firm bed or the floor with your legs extended. 2. Bring one knee to your chest. Your other leg should stay extended and in contact with the floor. 3. Hold your knee in place by grabbing your knee or thigh. 4. Pull on your knee until you feel a gentle stretch in your lower back. 5. Hold the stretch for 10-30 seconds. 6. Slowly release and straighten your leg. Pelvic Tilt Repeat these steps 5-10 times: 1. Lie on your back on a firm bed or the floor with your legs extended. 2. Bend your knees so they are pointing toward the ceiling and your feet are flat on the floor. 3. Tighten your lower abdominal muscles to press your lower back against the floor. This motion will tilt your pelvis so your tailbone points up toward the ceiling instead of pointing to your feet or the floor. 4. With gentle tension and even  breathing, hold this position for 5-10 seconds. Cat-Cow Repeat these steps until your lower back becomes more flexible: 1. Get into a hands-and-knees position on a firm surface. Keep your hands under your shoulders, and keep your knees under your hips. You may place padding under your knees for comfort. 2. Let your head hang down, and point your tailbone toward the floor so your lower back becomes rounded like the back of a cat. 3. Hold this position for 5 seconds. 4. Slowly lift your head and point your tailbone up toward the ceiling so your back forms a sagging arch like the back of a cow. 5. Hold this position for 5 seconds. Press-Ups Repeat these steps 5-10 times: 1. Lie on your abdomen (face-down) on the floor. 2. Place your palms near your head, about shoulder-width apart. 3. While you keep your back as relaxed as possible and keep your hips on the floor, slowly straighten your arms to raise the top half of your body and lift your shoulders. Do not use your back muscles to raise your upper torso. You may adjust the placement of your hands to make yourself more comfortable. 4. Hold this position for 5 seconds while you keep your back relaxed. 5. Slowly return to lying flat on the floor. Bridges Repeat these steps 10 times: 1. Lie on your back on a firm surface. 2. Bend your knees so they are pointing toward the ceiling and your feet are  flat on the floor. 3. Tighten your buttocks muscles and lift your buttocks off of the floor until your waist is at almost the same height as your knees. You should feel the muscles working in your buttocks and the back of your thighs. If you do not feel these muscles, slide your feet 1-2 inches farther away from your buttocks. 4. Hold this position for 3-5 seconds. 5. Slowly lower your hips to the starting position, and allow your buttocks muscles to relax completely. If this exercise is too easy, try doing it with your arms crossed over your  chest. Abdominal Crunches Repeat these steps 5-10 times: 1. Lie on your back on a firm bed or the floor with your legs extended. 2. Bend your knees so they are pointing toward the ceiling and your feet are flat on the floor. 3. Cross your arms over your chest. 4. Tip your chin slightly toward your chest without bending your neck. 5. Tighten your abdominal muscles and slowly raise your trunk (torso) high enough to lift your shoulder blades a tiny bit off of the floor. Avoid raising your torso higher than that, because it can put too much stress on your low back and it does not help to strengthen your abdominal muscles. 6. Slowly return to your starting position. Back Lifts Repeat these steps 5-10 times: 1. Lie on your abdomen (face-down) with your arms at your sides, and rest your forehead on the floor. 2. Tighten the muscles in your legs and your buttocks. 3. Slowly lift your chest off of the floor while you keep your hips pressed to the floor. Keep the back of your head in line with the curve in your back. Your eyes should be looking at the floor. 4. Hold this position for 3-5 seconds. 5. Slowly return to your starting position. SEEK MEDICAL CARE IF:  Your back pain or discomfort gets much worse when you do an exercise.  Your back pain or discomfort does not lessen within 2 hours after you exercise. If you have any of these problems, stop doing these exercises right away. Do not do them again unless your health care provider says that you can. SEEK IMMEDIATE MEDICAL CARE IF:  You develop sudden, severe back pain. If this happens, stop doing the exercises right away. Do not do them again unless your health care provider says that you can.   This information is not intended to replace advice given to you by your health care provider. Make sure you discuss any questions you have with your health care provider.   Document Released: 02/17/2004 Document Revised: 09/30/2014 Document Reviewed:  03/05/2014 Elsevier Interactive Patient Education 2016 [employer].  .    IF you received an x-ray today, you will receive an invoice from [facility]. Please contact [facility] at [phone] with questions or concerns regarding your invoice.   IF you received labwork today, you will receive an invoice from [employer]. Please contact [facility] at [phone] with questions or concerns regarding your invoice.   Our billing staff will not be able to assist you with questions regarding bills from these companies.  You will be contacted with the lab results as soon as they are available. The fastest way to get your results is to activate your My Chart account. Instructions are located on the last page of this paperwork. If you have not heard from [place] regarding the results in 2 weeks, please contact this office.

## 2015-05-15 NOTE — Progress Notes (Signed)
Patient ID: Teresa Brennan, female    DOB: March 15, 1985  Age: 30 y.o. MRN: [id number]  Chief Complaint  Patient presents with  . Annual Exam    with no pap     Subjective:   Annual physical examination  History: Patient is here for annual physical examination. She has no major acute complaints. She has a couple forms in the refill out for her colleges.  Past medical history: Medications: Oral contraceptive Allergies: None Medical history: Anemia, some years ago had a blood transfusion Surgical history: Cesarean section 1, had an ovarian cyst at the time also  Family history: Her step parents are deceased. Her natural parents or she does not have contact with the lab apparently  Social history: Does not smoke, drink, or use drugs. Is a church are tender. She is working as a Architect. In process of applying to a couple of nursing schools. She has a couple college so will be gone for 2 year degree.  Current allergies, medications, problem list, past/family and social histories reviewed.  Objective:  BP 122/78 mmHg  Pulse 87  Temp(Src) 98.1 F (36.7 C)  Resp 17  Ht 5\' 6"  (1.676 m)  Wt 181 lb (82.101 kg)  BMI 29.23 kg/m2  SpO2 98%  LMP 05/13/2015  Review of systems: Constitutional: Unremarkable HEENT: Unremarkable Wears contacts Respiratory: Unremarkable Cardiovascular: Unremarkable Gastrointestinal: Unremarkable Endocrine: Unremarkable Genitourinary: Unremarkable Musculoskeletal: Low back pains Dermatologic: Unremarkable Allergy immunology: Unremarkable Neurology: Unremarkable Hematology: Unremarkable Section Saralyn Pilar: Unremarkable  Well-developed well-nourished young lady in no acute distress. TMs normal. Eyes PERRLA. Throat clear. Teeth good. Neck supple without nodes or thyromegaly. No carotid bruits. Chest clear to auscultation. Heart regular without murmurs. Himself mass or tenderness. She symmetrical without any masses. No axillary nodes. Chest is  unremarkable. Spine normal with good range of motion.   Assessment & Plan:   Assessment: 1. Annual physical exam   2. History of anemia   3. History of ovarian cyst   4. History of cesarean section       Plan: See ordersorders  No orders of the defined types were placed in this encounter.    No orders of the defined types were placed in this encounter.         Patient Instructions   We will let you know the results of your labs in a few days.  Continue to follow with your OB/GYN for your birth control  Return annually or as needed  If at some point your interested in clinical remission type programs, consider the global missions health conference in November in [place], and the [place] medical and [facility] [employer] or [employer] .    IF you received an x-ray today, you will receive an invoice from [facility]. Please contact [facility] at [phone] with questions or concerns regarding your invoice.   IF you received labwork today, you will receive an invoice from [employer]. Please contact [facility] at [phone] with questions or concerns regarding your invoice.   Our billing staff will not be able to assist you with questions regarding bills from these companies.  You will be contacted with the lab results as soon as they are available. The fastest way to get your results is to activate your My Chart account. Instructions are located on the last page of this paperwork. If you have not heard from [place] regarding the results in 2 weeks, please contact this office.  Return in about 1 year (around 05/14/2016).   HOPPER,DAVID, MD 05/15/2015

## 2015-05-25 LAB — CBC WITH DIFFERENTIAL/PLATELET
BASOS ABS: 0 10*3/uL (ref 0.0–0.2)
Basos: 1 %
EOS (ABSOLUTE): 0.1 10*3/uL (ref 0.0–0.4)
Eos: 1 %
HEMATOCRIT: 42.5 % (ref 34.0–46.6)
HEMOGLOBIN: 14.2 g/dL (ref 11.1–15.9)
Immature Grans (Abs): 0 10*3/uL (ref 0.0–0.1)
Immature Granulocytes: 0 %
LYMPHS ABS: 2.3 10*3/uL (ref 0.7–3.1)
Lymphs: 44 %
MCH: 28.7 pg (ref 26.6–33.0)
MCHC: 33.4 g/dL (ref 31.5–35.7)
MCV: 86 fL (ref 79–97)
MONOCYTES: 5 %
MONOS ABS: 0.3 10*3/uL (ref 0.1–0.9)
NEUTROS ABS: 2.6 10*3/uL (ref 1.4–7.0)
Neutrophils: 49 %
Platelets: 323 10*3/uL (ref 150–379)
RBC: 4.95 x10E6/uL (ref 3.77–5.28)
RDW: 12.9 % (ref 12.3–15.4)
WBC: 5.3 10*3/uL (ref 3.4–10.8)

## 2015-05-25 LAB — COMPREHENSIVE METABOLIC PANEL
A/G RATIO: 1.6 (ref 1.2–2.2)
ALBUMIN: 4.3 g/dL (ref 3.5–5.5)
ALK PHOS: 68 IU/L (ref 39–117)
ALT: 18 IU/L (ref 0–32)
AST: 14 IU/L (ref 0–40)
BILIRUBIN TOTAL: 0.4 mg/dL (ref 0.0–1.2)
BUN / CREAT RATIO: 10 (ref 9–23)
BUN: 9 mg/dL (ref 6–20)
CHLORIDE: 102 mmol/L (ref 96–106)
CO2: 26 mmol/L (ref 18–29)
CREATININE: 0.93 mg/dL (ref 0.57–1.00)
Calcium: 9.6 mg/dL (ref 8.7–10.2)
GFR calc Af Amer: 97 mL/min/{1.73_m2} (ref >59)
GFR calc non Af Amer: 84 mL/min/{1.73_m2} (ref >59)
GLOBULIN, TOTAL: 2.7 g/dL (ref 1.5–4.5)
Glucose: 75 mg/dL (ref 65–99)
POTASSIUM: 3.8 mmol/L (ref 3.5–5.2)
SODIUM: 141 mmol/L (ref 134–144)
Total Protein: 7 g/dL (ref 6.0–8.5)

## 2015-05-25 LAB — VARICELLA ZOSTER ANTIBODY, IGG: VARICELLA: 2495 {index} (ref >165)

## 2015-05-27 ENCOUNTER — Encounter (INDEPENDENT_AMBULATORY_CARE_PROVIDER_SITE_OTHER): Payer: Self-pay

## 2015-05-28 ENCOUNTER — Telehealth: Payer: Self-pay

## 2015-05-28 LAB — QUANTIFERON TB GOLD ASSAY (BLOOD)

## 2015-05-28 LAB — QUANTIFERON IN TUBE
QFT TB AG MINUS NIL VALUE: 0.04 IU/mL
QUANTIFERON MITOGEN VALUE: 9.16 IU/mL
QUANTIFERON TB AG VALUE: 0.08 IU/mL
QUANTIFERON TB GOLD: NEGATIVE
Quantiferon Nil Value: 0.04 IU/mL

## 2015-05-28 NOTE — Telephone Encounter (Signed)
Labs printed, advised to pick up.

## 2015-05-28 NOTE — Telephone Encounter (Signed)
Patient would like to come in and pick up her recent lab results. Please call when ready!  [phone]

## 2015-08-17 ENCOUNTER — Ambulatory Visit (HOSPITAL_COMMUNITY)
Admission: EM | Admit: 2015-08-17 | Discharge: 2015-08-17 | Disposition: A | Discharge status: Home / Self Care | Payer: 59 | Attending: Family Medicine | Admitting: Family Medicine

## 2015-08-17 ENCOUNTER — Encounter (HOSPITAL_COMMUNITY): Payer: Self-pay | Admitting: Emergency Medicine

## 2015-08-17 DIAGNOSIS — W57XXXA Bitten or stung by nonvenomous insect and other nonvenomous arthropods, initial encounter: Secondary | ICD-10-CM

## 2015-08-17 DIAGNOSIS — H6592 Unspecified nonsuppurative otitis media, left ear: Secondary | ICD-10-CM

## 2015-08-17 MED ORDER — AMOXICILLIN 500 MG PO CAPS: 1000.0000 mg | ORAL_CAPSULE | Freq: Three times a day (TID) | ORAL | 0 refills | Status: DC

## 2015-08-17 MED ORDER — CIPROFLOXACIN-DEXAMETHASONE 0.3-0.1 % OT SUSP: 4.0000 [drp] | Freq: Two times a day (BID) | OTIC | 0 refills | Status: DC

## 2015-08-17 NOTE — Discharge Instructions (Signed)
Follow up with [facility]

## 2015-08-17 NOTE — ED Provider Notes (Signed)
CSN: [id number]     Arrival date & time 08/17/15  1716 History   None    Chief Complaint  Patient presents with  . Otalgia  . Insect Bite   (Consider location/radiation/quality/duration/timing/severity/associated sxs/prior Treatment) Patient presents with right ear pain X 1Week. Patient describes the pain as " feeling like I am hearing under water". Condition is acute  in nature. Condition is made better by nothing. Condition is made worse by nothing. Patient denies any treatments prior to arrival at this facility.  Patient presents with insect bites  1  Week to the lateral side of her right leg. "Patient states that I think I have bed bugs" Condition is acute in nature. Condition is made better by nothing. Condition is made worse by noting. Patient denies any treatments prior to t arrival at this facility.         Past Medical History:  Diagnosis Date  . Anemia   . Asthma   . Dyspareunia, female   . Fibroid   . Headache(784.0)   . History of fatigue 03/10/11  . Irregular menses   . Migraine   . Ovarian tumor   . Painful intercourse 11/15/10   Past Surgical History:  Procedure Laterality Date  . CESAREAN SECTION N/A 07/18/2013   Procedure: CESAREAN SECTION;  Surgeon: Ena Dawley, MD;  Location: [facility];  Service: Obstetrics;  Laterality: N/A;  . HYSTEROSCOPY     Family History  Problem Relation Age of Onset  . Adopted: Yes  . Sickle cell trait Daughter    Social History  Substance Use Topics  . Smoking status: Never Smoker  . Smokeless tobacco: Never Used  . Alcohol use No   OB History    Gravida Para Term Preterm AB Living   1 1 1     1    SAB TAB Ectopic Multiple Live Births                 Review of Systems  Constitutional: Negative.   HENT: Positive for ear pain.   Skin:       Bed bug bites to left leg    Allergies  Review of patient's allergies indicates no known allergies.  Home Medications   Prior to Admission medications   Medication Sig  Start Date End Date Taking? Authorizing Provider  acetaminophen (TYLENOL) 500 MG tablet Take 2 tablets (1,000 mg total) by mouth every 8 (eight) hours as needed. 05/08/14   Hanna Patel-Mills, PA-C  amoxicillin (AMOXIL) 500 MG capsule Take 2 capsules (1,000 mg total) by mouth 3 (three) times daily. 08/17/15   Jacqualine Mau, NP  cyanocobalamin (,VITAMIN B-12,) 1000 MCG/ML injection Inject 1 mL (1,000 mcg total) into the muscle once. EVERY 30 DAYS. Patient not taking: Reported on 05/15/2015 12/09/13   Bernadene Bell, MD  ferrous sulfate 325 (65 FE) MG EC tablet Take 1 tablet (325 mg total) by mouth daily with breakfast. Patient not taking: Reported on 05/15/2015 04/14/14   Curt Bears, MD   Meds Ordered and Administered this Visit  Medications - No data to display  BP 109/79 (BP Location: Left Arm)   Pulse 82   Temp 98.7 F (37.1 C) (Oral)   Resp 16   Ht 5\' 6"  (1.676 m)   Wt 181 lb (82.1 kg)   SpO2 100%   BMI 29.21 kg/m  No data found.   Physical Exam  Constitutional: She is oriented to person, place, and time. She appears well-developed.  HENT:  Right Ear:  External ear normal.  Redness noted to left external ear.   Neurological: She is alert and oriented to person, place, and time.    Urgent Care Course   Clinical Course    Procedures (including critical care time)  Labs Review Labs Reviewed - No data to display  Imaging Review No results found.   Visual Acuity Review  Right Eye Distance:   Left Eye Distance:   Bilateral Distance:    Right Eye Near:   Left Eye Near:    Bilateral Near:         MDM   1. Otitis media with effusion, left   2. Insect bites       Jacqualine Mau, NP 08/17/15 1958

## 2015-08-17 NOTE — ED Triage Notes (Signed)
Pt. Stated, I think I have pushed some wax back into my ear cause I've used Q tips and maybe pushed it back its hard to hear , Left ear. I've also also had some insect bites on my right leg.

## 2016-06-29 ENCOUNTER — Ambulatory Visit (HOSPITAL_COMMUNITY)
Admission: EM | Admit: 2016-06-29 | Discharge: 2016-06-29 | Disposition: A | Discharge status: Home / Self Care | Payer: 59 | Attending: Internal Medicine | Admitting: Internal Medicine

## 2016-06-29 ENCOUNTER — Encounter (HOSPITAL_COMMUNITY): Payer: Self-pay | Admitting: Emergency Medicine

## 2016-06-29 DIAGNOSIS — R059 Cough, unspecified: Secondary | ICD-10-CM

## 2016-06-29 DIAGNOSIS — J209 Acute bronchitis, unspecified: Secondary | ICD-10-CM | POA: Diagnosis not present

## 2016-06-29 DIAGNOSIS — R05 Cough: Secondary | ICD-10-CM

## 2016-06-29 MED ORDER — BENZONATATE 100 MG PO CAPS: 200.0000 mg | ORAL_CAPSULE | Freq: Three times a day (TID) | ORAL | 0 refills | Status: DC | PRN

## 2016-06-29 MED ORDER — METHYLPREDNISOLONE 4 MG PO TBPK: ORAL_TABLET | ORAL | 0 refills | Status: DC

## 2016-06-29 MED ORDER — AZITHROMYCIN 250 MG PO TABS: 250.0000 mg | ORAL_TABLET | Freq: Every day | ORAL | 0 refills | Status: DC

## 2016-06-29 MED ORDER — IPRATROPIUM BROMIDE 0.06 % NA SOLN: 2.0000 | Freq: Four times a day (QID) | NASAL | 0 refills | Status: DC

## 2016-06-29 NOTE — ED Provider Notes (Signed)
CSN: [id number]     Arrival date & time 06/29/16  1709 History   First MD Initiated Contact with Patient 06/29/16 1758     Chief Complaint  Patient presents with  . URI   (Consider location/radiation/quality/duration/timing/severity/associated sxs/prior Treatment) Patient c/o uri onset for 1 week.   The history is provided by the patient.  URI  Presenting symptoms: congestion, cough, fatigue and rhinorrhea   Severity:  Moderate Onset quality:  Sudden Duration:  1 week Timing:  Constant Chronicity:  New Worsened by:  Nothing Ineffective treatments:  None tried   Past Medical History:  Diagnosis Date  . Anemia   . Asthma   . Dyspareunia, female   . Fibroid   . Headache(784.0)   . History of fatigue 03/10/11  . Irregular menses   . Migraine   . Ovarian tumor   . Painful intercourse 11/15/10   Past Surgical History:  Procedure Laterality Date  . CESAREAN SECTION N/A 07/18/2013   Procedure: CESAREAN SECTION;  Surgeon: Ena Dawley, MD;  Location: [facility];  Service: Obstetrics;  Laterality: N/A;  . HYSTEROSCOPY     Family History  Problem Relation Age of Onset  . Adopted: Yes  . Sickle cell trait Daughter    Social History  Substance Use Topics  . Smoking status: Never Smoker  . Smokeless tobacco: Never Used  . Alcohol use No   OB History    Gravida Para Term Preterm AB Living   1 1 1     1    SAB TAB Ectopic Multiple Live Births           1     Review of Systems  Constitutional: Positive for fatigue.  HENT: Positive for congestion and rhinorrhea.   Eyes: Negative.   Respiratory: Positive for cough.   Cardiovascular: Negative.   Gastrointestinal: Negative.   Endocrine: Negative.   Genitourinary: Negative.   Musculoskeletal: Negative.   Allergic/Immunologic: Negative.   Neurological: Negative.   Hematological: Negative.   Psychiatric/Behavioral: Negative.     Allergies  Patient has no known allergies.  Home Medications   Prior to Admission  medications   Medication Sig Start Date End Date Taking? Authorizing Provider  acetaminophen (TYLENOL) 500 MG tablet Take 2 tablets (1,000 mg total) by mouth every 8 (eight) hours as needed. 05/08/14   Patel-Mills, Orvil Feil, PA-C  azithromycin (ZITHROMAX) 250 MG tablet Take 1 tablet (250 mg total) by mouth daily. Take first 2 tablets together, then 1 every day until finished. 06/29/16   Lysbeth Penner, FNP  benzonatate (TESSALON) 100 MG capsule Take 2 capsules (200 mg total) by mouth 3 (three) times daily as needed for cough. 06/29/16   Lysbeth Penner, FNP  ciprofloxacin-dexamethasone (CIPRODEX) otic suspension Place 4 drops into the left ear 2 (two) times daily. 08/17/15   Jacqualine Mau, NP  cyanocobalamin (,VITAMIN B-12,) 1000 MCG/ML injection Inject 1 mL (1,000 mcg total) into the muscle once. EVERY 30 DAYS. Patient not taking: Reported on 05/15/2015 12/09/13   Bernadene Bell, MD  ferrous sulfate 325 (65 FE) MG EC tablet Take 1 tablet (325 mg total) by mouth daily with breakfast. Patient not taking: Reported on 05/15/2015 04/14/14   Curt Bears, MD  ipratropium (ATROVENT) 0.06 % nasal spray Place 2 sprays into both nostrils 4 (four) times daily. 06/29/16   Lysbeth Penner, FNP  methylPREDNISolone (MEDROL DOSEPAK) 4 MG TBPK tablet Take 6-5-4-3-2-1 po qd 06/29/16   Lysbeth Penner, FNP   Meds Ordered and Administered  this Visit  Medications - No data to display  BP 110/74 (BP Location: Right Arm)   Pulse 99   Temp 98.4 F (36.9 C) (Oral)   Resp 18   LMP 06/06/2016   SpO2 100%  No data found.   Physical Exam  Constitutional: She is oriented to person, place, and time. She appears well-developed and well-nourished.  HENT:  Head: Normocephalic and atraumatic.  Right Ear: External ear normal.  Left Ear: External ear normal.  Mouth/Throat: Oropharynx is clear and moist.  Eyes: Conjunctivae and EOM are normal. Pupils are equal, round, and reactive to light.  Neck: Normal range of  motion. Neck supple.  Cardiovascular: Normal rate, regular rhythm and normal heart sounds.   Pulmonary/Chest: Effort normal and breath sounds normal.  Musculoskeletal: Normal range of motion.  Neurological: She is alert and oriented to person, place, and time.  Nursing note and vitals reviewed.   Urgent Care Course     Procedures (including critical care time)  Labs Review Labs Reviewed - No data to display  Imaging Review No results found.   Visual Acuity Review  Right Eye Distance:   Left Eye Distance:   Bilateral Distance:    Right Eye Near:   Left Eye Near:    Bilateral Near:         MDM   1. Acute bronchitis, unspecified organism   2. Cough    zpak Medrol dose pak atrovent Tessalon perles  Push po fluids, rest, tylenol and motrin otc prn as directed for fever, arthralgias, and myalgias.  Follow up prn if sx's continue or persist.    Lysbeth Penner, FNP 06/29/16 1845

## 2016-06-29 NOTE — ED Triage Notes (Signed)
Pt here for cold sx onset 1 week associated w/prod cough, nasal congestion, HA and reports she felt warm  Taking Robitussin and ibuprofen  A&O x4... NAD.... Ambulatory

## 2017-02-01 DIAGNOSIS — Z0279 Encounter for issue of other medical certificate: Secondary | ICD-10-CM

## 2017-03-21 ENCOUNTER — Encounter (HOSPITAL_COMMUNITY): Payer: Self-pay | Admitting: Emergency Medicine

## 2017-03-21 ENCOUNTER — Ambulatory Visit (HOSPITAL_COMMUNITY)
Admission: EM | Admit: 2017-03-21 | Discharge: 2017-03-21 | Disposition: A | Discharge status: Home / Self Care | Payer: 59 | Attending: Internal Medicine | Admitting: Internal Medicine

## 2017-03-21 ENCOUNTER — Other Ambulatory Visit: Payer: Self-pay

## 2017-03-21 DIAGNOSIS — Z86018 Personal history of other benign neoplasm: Secondary | ICD-10-CM

## 2017-03-21 DIAGNOSIS — N3090 Cystitis, unspecified without hematuria: Secondary | ICD-10-CM

## 2017-03-21 DIAGNOSIS — R102 Pelvic and perineal pain: Secondary | ICD-10-CM

## 2017-03-21 DIAGNOSIS — R300 Dysuria: Secondary | ICD-10-CM

## 2017-03-21 DIAGNOSIS — N3091 Cystitis, unspecified with hematuria: Secondary | ICD-10-CM | POA: Insufficient documentation

## 2017-03-21 DIAGNOSIS — Z3202 Encounter for pregnancy test, result negative: Secondary | ICD-10-CM

## 2017-03-21 LAB — POCT PREGNANCY, URINE: PREG TEST UR: NEGATIVE

## 2017-03-21 LAB — POCT URINALYSIS DIP (DEVICE)
Bilirubin Urine: NEGATIVE
GLUCOSE, UA: NEGATIVE mg/dL
Ketones, ur: NEGATIVE mg/dL
NITRITE: NEGATIVE
Protein, ur: 100 mg/dL — AB
Specific Gravity, Urine: 1.02 (ref 1.005–1.030)
UROBILINOGEN UA: 4 mg/dL — AB (ref 0.0–1.0)
pH: 7.5 (ref 5.0–8.0)

## 2017-03-21 MED ORDER — SULFAMETHOXAZOLE-TRIMETHOPRIM 800-160 MG PO TABS: 1.0000 | ORAL_TABLET | Freq: Two times a day (BID) | ORAL | 0 refills | Status: DC

## 2017-03-21 NOTE — Discharge Instructions (Signed)
Hydrate well with at least 2 liters (1 gallon) of water daily.  °

## 2017-03-21 NOTE — ED Provider Notes (Signed)
  MRN: [id number] DOB: 1985/10/27  Subjective:   Teresa Brennan is a 32 y.o. female presenting for sudden onset today of dysuria, hematuria, urinary frequency, cloudy malordorous urine and lower abdominal pain (R>L)/pelvic pain, nausea. Has not tried any medications for relief. Takes OCP. Does not hydrate well. Denies fever, flank pain, genital rash and vaginal discharge, vomiting. Has history of uterine fibroids which she reports were removed when she had her C-section.  Teresa Brennan has No Known Allergies. Teresa Brennan  has a past medical history of Anemia, Asthma, Dyspareunia, female, Fibroid, Headache(784.0), History of fatigue (03/10/11), Irregular menses, Migraine, Ovarian tumor, and Painful intercourse (11/15/10). Also  has a past surgical history that includes Hysteroscopy and Cesarean section (N/A, 07/18/2013).  Objective:   Vitals: BP 137/86 (BP Location: Left Arm) Comment: large cuff  Pulse 80   Temp 98.3 F (36.8 C) (Oral)   Resp (!) 22   SpO2 100%   Physical Exam  Constitutional: She is oriented to person, place, and time. She appears well-developed and well-nourished.  HENT:  Mouth/Throat: Oropharynx is clear and moist.  Eyes: No scleral icterus.  Cardiovascular: Normal rate, regular rhythm and intact distal pulses. Exam reveals no gallop and no friction rub.  No murmur heard. Pulmonary/Chest: No respiratory distress. She has no wheezes. She has no rales.  Abdominal: Soft. Bowel sounds are normal. She exhibits no distension and no mass. There is no tenderness. There is no rebound and no guarding.  No CVA tenderness.  Neurological: She is alert and oriented to person, place, and time.  Skin: Skin is warm and dry.  Psychiatric: She has a normal mood and affect.   Results for orders placed or performed during the hospital encounter of 03/21/17 (from the past 24 hour(s))  POCT urinalysis dip (device)     Status: Abnormal   Collection Time: 03/21/17  5:41 PM  Result Value Ref Range   Glucose, UA NEGATIVE NEGATIVE mg/dL   Bilirubin Urine NEGATIVE NEGATIVE   Ketones, ur NEGATIVE NEGATIVE mg/dL   Specific Gravity, Urine 1.020 1.005 - 1.030   Hgb urine dipstick LARGE (A) NEGATIVE   pH 7.5 5.0 - 8.0   Protein, ur 100 (A) NEGATIVE mg/dL   Urobilinogen, UA 4.0 (H) 0.0 - 1.0 mg/dL   Nitrite NEGATIVE NEGATIVE   Leukocytes, UA SMALL (A) NEGATIVE  Pregnancy, urine POC     Status: None   Collection Time: 03/21/17  5:44 PM  Result Value Ref Range   Preg Test, Ur NEGATIVE NEGATIVE   Assessment and Plan :   Cystitis  Dysuria  Pelvic pain  History of uterine fibroid  Will cover for cystitis with Bactrim x5 days. Urine culture is pending. Discussed differential, return-to-clinic precautions discussed, patient verbalized understanding.    Jaynee Eagles, [place] 03/21/17 1753

## 2017-03-21 NOTE — ED Triage Notes (Signed)
Onset today of intense cramps in lower abdomen, left more than right abdomen.  Noted an odor difference to urine

## 2017-03-22 LAB — URINE CYTOLOGY ANCILLARY ONLY
Chlamydia: NEGATIVE
Neisseria Gonorrhea: NEGATIVE
Trichomonas: NEGATIVE

## 2017-03-24 LAB — URINE CULTURE: Culture: >=100000 — AB

## 2017-10-04 MED FILL — DAYSEE 0.15-0.03-0.01 MG TA: 0.15-0.03 & | 91 days supply | Qty: 91 | Fill #0

## 2017-11-21 ENCOUNTER — Ambulatory Visit (INDEPENDENT_AMBULATORY_CARE_PROVIDER_SITE_OTHER): Payer: No Typology Code available for payment source | Admitting: Family

## 2017-11-21 ENCOUNTER — Encounter: Payer: Self-pay | Admitting: Family

## 2017-11-21 VITALS — BP 122/78 | HR 105 | Temp 99.4°F | Ht 66.0 in | Wt 190.1 lb

## 2017-11-21 DIAGNOSIS — Z13220 Encounter for screening for lipoid disorders: Secondary | ICD-10-CM

## 2017-11-21 DIAGNOSIS — R5383 Other fatigue: Secondary | ICD-10-CM | POA: Diagnosis not present

## 2017-11-21 DIAGNOSIS — D649 Anemia, unspecified: Secondary | ICD-10-CM

## 2017-11-21 MED ORDER — ESCITALOPRAM OXALATE 10 MG PO TABS: 10.0000 mg | ORAL_TABLET | Freq: Every day | ORAL | 1 refills | Status: DC

## 2017-11-21 NOTE — Progress Notes (Signed)
Teresa Brennan is a 32 y.o. female with the following history as recorded in EpicCare:  Patient Active Problem List   Diagnosis Date Noted  . History of anemia 05/15/2015  . Status post primary low transverse cesarean section--NRFHR 07/20/2013  . Uterine fibroid 07/17/2013  . 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 Cervical high risk HPV (human papillomavirus) test positive 07/17/2013  . Pernicious anemia 10/03/2011  . Vitamin B12 deficiency 03/10/2011  . Anemia 03/10/2011    Current Outpatient Medications  Medication Sig Dispense Refill  . DAYSEE 0.15-0.03 &0.01 MG tablet   4  . escitalopram (LEXAPRO) 10 MG tablet Take 1 tablet (10 mg total) by mouth daily. 30 tablet 1   No current facility-administered medications for this visit.     Allergies: Patient has no known allergies.  Past Medical History:  Diagnosis Date  . Anemia   . Asthma   . Dyspareunia, female   . Fibroid   . Headache(784.0)   . History of fatigue 03/10/11  . Irregular menses   . Migraine   . Ovarian tumor   . Painful intercourse 11/15/10    Past Surgical History:  Procedure Laterality Date  . CESAREAN SECTION N/A 07/18/2013   Procedure: CESAREAN SECTION;  Surgeon: Ena Dawley, MD;  Location: [facility];  Service: Obstetrics;  Laterality: N/A;  . HYSTEROSCOPY      Family History  Adopted: Yes  Problem Relation Age of Onset  . Sickle cell trait Daughter     Social History   Tobacco Use  . Smoking status: Never Smoker  . Smokeless tobacco: Never Used  Substance Use Topics  . Alcohol use: No    Alcohol/week: 0.0 standard drinks    Subjective:  Patient presents today as a new patient; is RN on oncology floor at [employer];   1) History of pernicious anemia; would like to get her B12 and iron studies updated; 2) Would like to get a medication for anxiety; working with counselor and recommended started medication; feels like she has chronic anxiety disorder. Feels  like she worries a lot, hard to turn brain off; can affect sleep;  3) Being treated for Vitamin D deficiency by her GYN  LMP- continual cycling  Objective:  Vitals:   11/21/17 1121  BP: 122/78  Pulse: (!) 105  Temp: 99.4 F (37.4 C)  TempSrc: Oral  SpO2: 98%  Weight: 190 lb 1.3 oz (86.2 kg)  Height: '5\' 6"'  (1.676 m)    General: Well developed, well nourished, in no acute distress  Skin : Warm and dry.  Head: Normocephalic and atraumatic  Eyes: Sclera and conjunctiva clear; pupils round and reactive to light; extraocular movements intact  Ears: External normal; canals clear; tympanic membranes normal  Oropharynx: Pink, supple. No suspicious lesions  Neck: Supple without thyromegaly, adenopathy  Lungs: Respirations unlabored; clear to auscultation bilaterally without wheeze, rales, rhonchi  CVS exam: normal rate and regular rhythm.  Musculoskeletal: No deformities; no active joint inflammation  Extremities: No edema, cyanosis, clubbing  Vessels: Symmetric bilaterally  Neurologic: Alert and oriented; speech intact; face symmetrical; moves all extremities well; CNII-XII intact without focal deficit   Assessment:  1. Other fatigue   2. Anemia, unspecified type   3. Lipid screening     Plan:  1. Will update labs; trial of Lexapro 10 mg daily; risks and benefits discussed; continue working with therapist; follow-up in 1 month, sooner prn. 2. Update labs at patient's convenience- patient has taken B12 shots  in the past; may need to consider re-starting.  3. Return for fasting labs;   No follow-ups on file.  Orders Placed This Encounter  Procedures  . CBC w/Diff    Standing Status:   Future    Standing Expiration Date:   11/21/2018  . Comp Met (CMET)    Standing Status:   Future    Standing Expiration Date:   11/21/2018  . TSH    Standing Status:   Future    Standing Expiration Date:   11/21/2018  . Lipid panel    Standing Status:   Future    Standing Expiration Date:    11/22/2018  . B12    Standing Status:   Future    Standing Expiration Date:   11/21/2018  . Iron, TIBC and Ferritin Panel    Standing Status:   Future    Standing Expiration Date:   11/22/2018    Requested Prescriptions   Signed Prescriptions Disp Refills  . escitalopram (LEXAPRO) 10 MG tablet 30 tablet 1    Sig: Take 1 tablet (10 mg total) by mouth daily.

## 2017-12-24 ENCOUNTER — Encounter: Payer: Self-pay | Admitting: Family

## 2017-12-24 ENCOUNTER — Ambulatory Visit (INDEPENDENT_AMBULATORY_CARE_PROVIDER_SITE_OTHER): Payer: No Typology Code available for payment source | Admitting: Family

## 2017-12-24 ENCOUNTER — Other Ambulatory Visit (INDEPENDENT_AMBULATORY_CARE_PROVIDER_SITE_OTHER): Payer: No Typology Code available for payment source

## 2017-12-24 VITALS — BP 118/78 | HR 106 | Temp 98.5°F | Ht 66.0 in | Wt 186.0 lb

## 2017-12-24 DIAGNOSIS — R5383 Other fatigue: Secondary | ICD-10-CM | POA: Diagnosis not present

## 2017-12-24 DIAGNOSIS — D649 Anemia, unspecified: Secondary | ICD-10-CM

## 2017-12-24 DIAGNOSIS — Z13220 Encounter for screening for lipoid disorders: Secondary | ICD-10-CM

## 2017-12-24 DIAGNOSIS — F4321 Adjustment disorder with depressed mood: Secondary | ICD-10-CM | POA: Diagnosis not present

## 2017-12-24 DIAGNOSIS — E538 Deficiency of other specified B group vitamins: Secondary | ICD-10-CM | POA: Diagnosis not present

## 2017-12-24 LAB — COMPREHENSIVE METABOLIC PANEL
ALBUMIN: 3.9 g/dL (ref 3.5–5.2)
ALT: 16 U/L (ref 0–35)
AST: 15 U/L (ref 0–37)
Alkaline Phosphatase: 49 U/L (ref 39–117)
BUN: 11 mg/dL (ref 6–23)
CALCIUM: 8.9 mg/dL (ref 8.4–10.5)
CHLORIDE: 105 meq/L (ref 96–112)
CO2: 25 meq/L (ref 19–32)
Creatinine, Ser: 0.98 mg/dL (ref 0.40–1.20)
GFR: 84.96 mL/min (ref >60.00)
Glucose, Bld: 90 mg/dL (ref 70–99)
POTASSIUM: 3.9 meq/L (ref 3.5–5.1)
SODIUM: 138 meq/L (ref 135–145)
Total Bilirubin: 0.6 mg/dL (ref 0.2–1.2)
Total Protein: 6.9 g/dL (ref 6.0–8.3)

## 2017-12-24 LAB — CBC WITH DIFFERENTIAL/PLATELET
BASOS PCT: 1.9 % (ref 0.0–3.0)
Basophils Absolute: 0.1 10*3/uL (ref 0.0–0.1)
Eosinophils Absolute: 0.1 10*3/uL (ref 0.0–0.7)
Eosinophils Relative: 1.5 % (ref 0.0–5.0)
HCT: 39.8 % (ref 36.0–46.0)
HEMOGLOBIN: 13.9 g/dL (ref 12.0–15.0)
LYMPHS PCT: 38.9 % (ref 12.0–46.0)
Lymphs Abs: 2.1 10*3/uL (ref 0.7–4.0)
MCHC: 35 g/dL (ref 30.0–36.0)
MCV: 107.4 fl — ABNORMAL HIGH (ref 78.0–100.0)
Monocytes Absolute: 0.3 10*3/uL (ref 0.1–1.0)
Monocytes Relative: 5 % (ref 3.0–12.0)
NEUTROS ABS: 2.8 10*3/uL (ref 1.4–7.7)
Neutrophils Relative %: 52.7 % (ref 43.0–77.0)
Platelets: 342 10*3/uL (ref 150.0–400.0)
RBC: 3.7 Mil/uL — ABNORMAL LOW (ref 3.87–5.11)
RDW: 14.7 % (ref 11.5–15.5)
WBC: 5.3 10*3/uL (ref 4.0–10.5)

## 2017-12-24 LAB — LIPID PANEL
CHOL/HDL RATIO: 3
Cholesterol: 139 mg/dL (ref 0–200)
HDL: 39.8 mg/dL (ref >39.00)
LDL CALC: 87 mg/dL (ref 0–99)
NonHDL: 99.24
TRIGLYCERIDES: 63 mg/dL (ref 0.0–149.0)
VLDL: 12.6 mg/dL (ref 0.0–40.0)

## 2017-12-24 LAB — VITAMIN B12: Vitamin B-12: 39 pg/mL — ABNORMAL LOW (ref 211–911)

## 2017-12-24 LAB — TSH: TSH: 0.58 u[IU]/mL (ref 0.35–4.50)

## 2017-12-24 MED ORDER — CYANOCOBALAMIN 1000 MCG/ML IJ SOLN
1000.0000 ug | Freq: Once | INTRAMUSCULAR | Status: AC
  Administered 2017-12-24: 1000 ug via INTRAMUSCULAR

## 2017-12-24 MED ORDER — ESCITALOPRAM OXALATE 10 MG PO TABS: 10.0000 mg | ORAL_TABLET | Freq: Every day | ORAL | 1 refills | Status: DC

## 2017-12-24 MED FILL — ESCITALOPRAM 10 MG TABLET: 10 | 90 days supply | Qty: 90 | Fill #0

## 2017-12-24 NOTE — Progress Notes (Signed)
Teresa Brennan is a 32 y.o. female with the following history as recorded in EpicCare:  Patient Active Problem List   Diagnosis Date Noted  . History of anemia 05/15/2015  . Status post primary low transverse cesarean section--NRFHR 07/20/2013  . Uterine fibroid 07/17/2013  . 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 Cervical high risk HPV (human papillomavirus) test positive 07/17/2013  . Pernicious anemia 10/03/2011  . Vitamin B12 deficiency 03/10/2011  . Anemia 03/10/2011    Current Outpatient Medications  Medication Sig Dispense Refill  . DAYSEE 0.15-0.03 &0.01 MG tablet   4  . escitalopram (LEXAPRO) 10 MG tablet Take 1 tablet (10 mg total) by mouth daily. 90 tablet 1   No current facility-administered medications for this visit.     Allergies: Patient has no known allergies.  Past Medical History:  Diagnosis Date  . Anemia   . Asthma   . Dyspareunia, female   . Fibroid   . Headache(784.0)   . History of fatigue 03/10/11  . Irregular menses   . Migraine   . Ovarian tumor   . Painful intercourse 11/15/10    Past Surgical History:  Procedure Laterality Date  . CESAREAN SECTION N/A 07/18/2013   Procedure: CESAREAN SECTION;  Surgeon: Ena Dawley, MD;  Location: [facility];  Service: Obstetrics;  Laterality: N/A;  . HYSTEROSCOPY      Family History  Adopted: Yes  Problem Relation Age of Onset  . Sickle cell trait Daughter     Social History   Tobacco Use  . Smoking status: Never Smoker  . Smokeless tobacco: Never Used  Substance Use Topics  . Alcohol use: No    Alcohol/week: 0.0 standard drinks    Subjective:  Presents for a one month follow-up on recent start of Lexapro; does feel medication is offering benefit- has been feeling less depressed recently; comfortable staying on medication- would like to continue on same dosage;  Continuing to struggle with fatigue- known history of B12 deficiency; discussed at last OV- patient  unable to get her labs done until this morning but B12 extremely low at 39; hemoglobin and hematocrit are normal- iron studies are pending; is having Vitamin D managed by GYN;    Objective:  Vitals:   12/24/17 1406  BP: 118/78  Pulse: (!) 106  Temp: 98.5 F (36.9 C)  TempSrc: Oral  SpO2: 98%  Weight: 186 lb 0.6 oz (84.4 kg)  Height: 5\' 6"  (1.676 m)    General: Well developed, well nourished, in no acute distress  Skin : Warm and dry.  Head: Normocephalic and atraumatic  Lungs: Respirations unlabored; clear to auscultation bilaterally without wheeze, rales, rhonchi  CVS exam: normal rate and regular rhythm.  Neurologic: Alert and oriented; speech intact; face symmetrical; moves all extremities well; CNII-XII intact without focal deficit   Assessment:  1. Situational depression   2. Vitamin B12 deficiency     Plan:  1. Per patient, improved; good response to Lexapro 10 mg daily; refill updated; 2. Reviewed labs with patient; start B12 injections today- continue weekly x 3 more weeks and then re-evaluate; suspect this is could be major contributor to her fatigue symptoms.    Return in about 1 week (around 12/31/2017) for nurse visit/ B12 shot.  No orders of the defined types were placed in this encounter.   Requested Prescriptions   Signed Prescriptions Disp Refills  . escitalopram (LEXAPRO) 10 MG tablet 90 tablet 1    Sig: Take 1 tablet (  10 mg total) by mouth daily.

## 2017-12-25 ENCOUNTER — Other Ambulatory Visit: Payer: Self-pay | Admitting: Family

## 2017-12-25 DIAGNOSIS — R7989 Other specified abnormal findings of blood chemistry: Secondary | ICD-10-CM

## 2017-12-25 LAB — IRON,TIBC AND FERRITIN PANEL
%SAT: 50 % — AB (ref 16–45)
FERRITIN: 247 ng/mL — AB (ref 16–154)
IRON: 164 ug/dL (ref 40–190)
TIBC: 325 mcg/dL (calc) (ref 250–450)

## 2017-12-31 MED FILL — DAYSEE 0.15-0.03-0.01 MG TA: 0.15-0.03 & | 91 days supply | Qty: 91 | Fill #1

## 2017-12-31 MED FILL — VALACYCLOVIR HCL 500 MG TAB: 500 | 15 days supply | Qty: 30 | Fill #0

## 2018-01-01 ENCOUNTER — Ambulatory Visit (INDEPENDENT_AMBULATORY_CARE_PROVIDER_SITE_OTHER): Payer: No Typology Code available for payment source | Admitting: *Deleted

## 2018-01-01 DIAGNOSIS — E538 Deficiency of other specified B group vitamins: Secondary | ICD-10-CM | POA: Diagnosis not present

## 2018-01-01 DIAGNOSIS — D510 Vitamin B12 deficiency anemia due to intrinsic factor deficiency: Secondary | ICD-10-CM | POA: Diagnosis not present

## 2018-01-01 MED ORDER — CYANOCOBALAMIN 1000 MCG/ML IJ SOLN
1000.0000 ug | Freq: Once | INTRAMUSCULAR | Status: AC
  Administered 2018-01-01: 1000 ug via INTRAMUSCULAR

## 2018-01-03 ENCOUNTER — Encounter: Payer: Self-pay | Admitting: Hematology and Oncology

## 2018-01-03 ENCOUNTER — Telehealth: Payer: Self-pay | Admitting: Hematology and Oncology

## 2018-01-03 NOTE — Telephone Encounter (Signed)
New referral received from Jodi Mourning, NP from [facility] PCP. Pt has been cld and scheduled to see Dr. Lindi Adie on 1/13 at 345pm. Pt aware to arrive 30 minutes early. Letter mailed.

## 2018-01-09 ENCOUNTER — Ambulatory Visit (INDEPENDENT_AMBULATORY_CARE_PROVIDER_SITE_OTHER): Payer: No Typology Code available for payment source | Admitting: Emergency Medicine

## 2018-01-09 DIAGNOSIS — D510 Vitamin B12 deficiency anemia due to intrinsic factor deficiency: Secondary | ICD-10-CM

## 2018-01-09 DIAGNOSIS — E538 Deficiency of other specified B group vitamins: Secondary | ICD-10-CM | POA: Diagnosis not present

## 2018-01-09 MED ORDER — CYANOCOBALAMIN 1000 MCG/ML IJ SOLN
1000.0000 ug | Freq: Once | INTRAMUSCULAR | Status: AC
  Administered 2018-01-09: 1000 ug via INTRAMUSCULAR

## 2018-01-09 NOTE — Progress Notes (Signed)
Pt received 3rd b12 shot today. Will received one more injection and have blood work done per Jodi Mourning, NP

## 2018-01-25 ENCOUNTER — Ambulatory Visit (INDEPENDENT_AMBULATORY_CARE_PROVIDER_SITE_OTHER): Payer: No Typology Code available for payment source

## 2018-01-25 DIAGNOSIS — D510 Vitamin B12 deficiency anemia due to intrinsic factor deficiency: Secondary | ICD-10-CM

## 2018-01-25 DIAGNOSIS — E538 Deficiency of other specified B group vitamins: Secondary | ICD-10-CM

## 2018-01-25 MED ORDER — CYANOCOBALAMIN 1000 MCG/ML IJ SOLN
1000.0000 ug | Freq: Once | INTRAMUSCULAR | Status: AC
  Administered 2018-01-25: 1000 ug via INTRAMUSCULAR

## 2018-01-25 NOTE — Progress Notes (Signed)
B12 injection today    Binnie Rail, MD

## 2018-01-30 ENCOUNTER — Telehealth: Payer: Self-pay | Admitting: Family

## 2018-01-30 NOTE — Telephone Encounter (Signed)
Patient has dropped of [employer] forms to be completed. Her insurance is  Requesting the forms to be completed for the patient having anxiety and the medication she takes for it.

## 2018-01-31 NOTE — Telephone Encounter (Signed)
Forms have been completed & placed in providers box to review and sign.  °

## 2018-02-01 NOTE — Telephone Encounter (Signed)
Forms signed, Faxed to [facility] &mailed, copy sent to scan & charged for.   Copy mailed to patient as well.

## 2018-02-04 ENCOUNTER — Other Ambulatory Visit: Payer: Self-pay | Admitting: Hematology and Oncology

## 2018-02-04 ENCOUNTER — Inpatient Hospital Stay
Payer: No Typology Code available for payment source | Attending: Hematology and Oncology | Admitting: Hematology and Oncology

## 2018-02-04 ENCOUNTER — Inpatient Hospital Stay: Payer: No Typology Code available for payment source

## 2018-02-04 DIAGNOSIS — Z79899 Other long term (current) drug therapy: Secondary | ICD-10-CM | POA: Diagnosis not present

## 2018-02-04 DIAGNOSIS — D510 Vitamin B12 deficiency anemia due to intrinsic factor deficiency: Secondary | ICD-10-CM | POA: Diagnosis not present

## 2018-02-04 DIAGNOSIS — R7989 Other specified abnormal findings of blood chemistry: Secondary | ICD-10-CM | POA: Insufficient documentation

## 2018-02-04 LAB — CBC WITH DIFFERENTIAL (CANCER CENTER ONLY)
Abs Immature Granulocytes: 0.02 10*3/uL (ref 0.00–0.07)
Basophils Absolute: 0.1 10*3/uL (ref 0.0–0.1)
Basophils Relative: 1 %
EOS PCT: 2 %
Eosinophils Absolute: 0.2 10*3/uL (ref 0.0–0.5)
HCT: 44.2 % (ref 36.0–46.0)
Hemoglobin: 14.9 g/dL (ref 12.0–15.0)
Immature Granulocytes: 0 %
Lymphocytes Relative: 39 %
Lymphs Abs: 2.7 10*3/uL (ref 0.7–4.0)
MCH: 32.1 pg (ref 26.0–34.0)
MCHC: 33.7 g/dL (ref 30.0–36.0)
MCV: 95.3 fL (ref 80.0–100.0)
Monocytes Absolute: 0.5 10*3/uL (ref 0.1–1.0)
Monocytes Relative: 8 %
Neutro Abs: 3.4 10*3/uL (ref 1.7–7.7)
Neutrophils Relative %: 50 %
Platelet Count: 320 10*3/uL (ref 150–400)
RBC: 4.64 MIL/uL (ref 3.87–5.11)
RDW: 13.4 % (ref 11.5–15.5)
WBC Count: 6.9 10*3/uL (ref 4.0–10.5)
nRBC: 0 % (ref 0.0–0.2)

## 2018-02-04 LAB — C-REACTIVE PROTEIN: CRP: 2.8 mg/dL — AB (ref <1.0)

## 2018-02-04 MED ORDER — CYANOCOBALAMIN 1000 MCG/ML IJ SOLN: 1000.0000 ug | INTRAMUSCULAR | 3 refills | Status: DC

## 2018-02-04 MED ORDER — NEEDLES & SYRINGES MISC: 1.0000 | 3 refills | Status: DC

## 2018-02-04 MED FILL — CYANOCOBALAMIN 1,000 MCG/ML: 1000 | 84 days supply | Qty: 12 | Fill #0

## 2018-02-04 NOTE — Progress Notes (Signed)
[facility] NOTE  Patient Care Team: Marrian Salvage, FNP as PCP - General (Internal Medicine) Ena Dawley, MD as Consulting Physician (Obstetrics and Gynecology) Bernadene Bell, MD (Inactive) as Consulting Physician (Hematology)  CHIEF COMPLAINTS/PURPOSE OF CONSULTATION:  Elevated ferritin, pernicious anemia  HISTORY OF PRESENTING ILLNESS:  Teresa Brennan 33 y.o. female is here because of recent diagnosis of severe B12 deficiency as well as elevated ferritin.  Patient has a prior history of pernicious anemia that was diagnosed by Dr. Julien Nordmann and she was supposed to get monthly B12 injections but because of school and other issues she did not receive any B12 replacement therapy and only recently established with her primary care physician who checked her blood work and saw that the B12 level was incredibly low and started her on weekly B12 injections at her office.  She was also noted to have elevated ferritin levels which prompted the referral.  Previously she was iron deficient and had even required IV iron therapy. She reports some tingling and numbness of her hands and feet as well as fatigue. She has been referred to [place] for evaluation and and to make sure that she does not have hemochromatosis.  I reviewed her records extensively and collaborated the history with the patient.    MEDICAL HISTORY:  Past Medical History:  Diagnosis Date  . Anemia   . Asthma   . Dyspareunia, female   . Fibroid   . Headache(784.0)   . History of fatigue 03/10/11  . Irregular menses   . Migraine   . Ovarian tumor   . Painful intercourse 11/15/10    SURGICAL HISTORY: Past Surgical History:  Procedure Laterality Date  . CESAREAN SECTION N/A 07/18/2013   Procedure: CESAREAN SECTION;  Surgeon: Ena Dawley, MD;  Location: [facility];  Service: Obstetrics;  Laterality: N/A;  . HYSTEROSCOPY      SOCIAL HISTORY: Social History   Socioeconomic History  . Marital  status: Single    Spouse name: n/a  . Number of children: 1  . Years of education: 12+  . Highest education level: Not on file  Occupational History  . Occupation: billing specialist    Comment: [place]  . Financial resource strain: Not on file  . Food insecurity:    Worry: Not on file    Inability: Not on file  . Transportation needs:    Medical: Not on file    Non-medical: Not on file  Tobacco Use  . Smoking status: Never Smoker  . Smokeless tobacco: Never Used  Substance and Sexual Activity  . Alcohol use: No    Alcohol/week: 0.0 standard drinks  . Drug use: No  . Sexual activity: Yes    Partners: Male    Birth control/protection: Pill  Lifestyle  . Physical activity:    Days per week: Not on file    Minutes per session: Not on file  . Stress: Not on file  Relationships  . Social connections:    Talks on phone: Not on file    Gets together: Not on file    Attends religious service: Not on file    Active member of club or organization: Not on file    Attends meetings of clubs or organizations: Not on file    Relationship status: Not on file  . Intimate partner violence:    Fear of current or ex partner: Not on file    Emotionally abused: Not on file    Physically abused:  Not on file    Forced sexual activity: Not on file  Other Topics Concern  . Not on file  Social History Narrative   Working on an associates degree in Nature conservation officer.   Adopted, along with her fraternal twin sister, at 66 days old.   She is in contact with other biological siblings.   Lives alone, with her daughter, born in 06/2013.   Her adoptive parents are both deceased.   Her sister lives nearby.    FAMILY HISTORY: Family History  Adopted: Yes  Problem Relation Age of Onset  . Sickle cell trait Daughter     ALLERGIES:  has No Known Allergies.  MEDICATIONS:  Current Outpatient Medications  Medication Sig Dispense Refill  . DAYSEE 0.15-0.03 &0.01 MG tablet   4  .  escitalopram (LEXAPRO) 10 MG tablet Take 1 tablet (10 mg total) by mouth daily. 90 tablet 1   No current facility-administered medications for this visit.     REVIEW OF SYSTEMS:   Constitutional: Denies fevers, chills or abnormal night sweats Eyes: Denies blurriness of vision, double vision or watery eyes Ears, nose, mouth, throat, and face: Denies mucositis or sore throat Respiratory: Denies cough, dyspnea or wheezes Cardiovascular: Denies palpitation, chest discomfort or lower extremity swelling Gastrointestinal:  Denies nausea, heartburn or change in bowel habits Skin: Denies abnormal skin rashes Lymphatics: Denies new lymphadenopathy or easy bruising Neurological: Numbness of hands and feet as well as aches and pains Behavioral/Psych: Mood is stable, no new changes    All other systems were reviewed with the patient and are negative.  PHYSICAL EXAMINATION: ECOG PERFORMANCE STATUS: 1 - Symptomatic but completely ambulatory  Vitals:   02/04/18 1538  BP: 113/87  Pulse: 100  Resp: 20  Temp: 98.4 F (36.9 C)  SpO2: 100%   Filed Weights   02/04/18 1538  Weight: 194 lb 8 oz (88.2 kg)    GENERAL:alert, no distress and comfortable SKIN: skin color, texture, turgor are normal, no rashes or significant lesions EYES: normal, conjunctiva are pink and non-injected, sclera clear OROPHARYNX:no exudate, no erythema and lips, buccal mucosa, and tongue normal  NECK: supple, thyroid normal size, non-tender, without nodularity LYMPH:  no palpable lymphadenopathy in the cervical, axillary or inguinal LUNGS: clear to auscultation and percussion with normal breathing effort HEART: regular rate & rhythm and no murmurs and no lower extremity edema ABDOMEN:abdomen soft, non-tender and normal bowel sounds Musculoskeletal:no cyanosis of digits and no clubbing  PSYCH: alert & oriented x 3 with fluent speech NEURO: Peripheral sensory neuropathy   LABORATORY DATA:  I have reviewed the data as  listed Lab Results  Component Value Date   WBC 5.3 12/24/2017   HGB 13.9 12/24/2017   HCT 39.8 12/24/2017   MCV 107.4 (H) 12/24/2017   PLT 342.0 12/24/2017   Lab Results  Component Value Date   NA 138 12/24/2017   K 3.9 12/24/2017   CL 105 12/24/2017   CO2 25 12/24/2017    RADIOGRAPHIC STUDIES: I have personally reviewed the radiological reports and agreed with the findings in the report.  ASSESSMENT AND PLAN:  Elevated ferritin Lab review 12/24/2017: Hemoglobin 13.9, MCV 107.4, vitamin B12 39 Ferritin 247, iron saturation 50%  Causes of elevated ferritin: Inflammation versus iron overload The lab test did not make sense.  I would like to repeat them today. I would also like to send for hemochromatosis gene testing and C-reactive protein.   Severe vitamin B12 deficiency: Previously diagnosed as pernicious anemia. I explained  to her in detail what pernicious anemia actually means and that she needs to be on vitamin B12 replacement therapy her entire life. I encouraged to continue weekly B12 injections for another month and then go to every other week for another month and then monthly thereafter. She wanted to do B12 injections at home. I sent her prescription for B12 shots along with syringes gauzes and alcohol swabs. I explained to her that chronic profound B12 deficiency leads to subacute combined degeneration of the tracks in the spinal cord which would lead to ataxia and sensory neuropathy. No amount of oral B12 will be absorbed because of her pernicious anemia.  Return to clinic in 2 weeks to review the results of blood work to be done today.   All questions were answered. The patient knows to call the clinic with any problems, questions or concerns.    Harriette Ohara, MD 02/04/18

## 2018-02-04 NOTE — Assessment & Plan Note (Signed)
Lab review 12/24/2017: Hemoglobin 13.9, MCV 107.4, vitamin B12 39 Ferritin 247, iron saturation 50%  Causes of elevated ferritin: Inflammation versus iron overload The lab test did not make sense.  I would like to repeat them today.  Severe vitamin B12 deficiency: I will send for antiparietal cell and anti-intrinsic factor antibodies to rule out pernicious anemia. Patient needs B12 replacement therapy in order to prevent neurological damage.  Return to clinic in 1 month with labs and follow-up.

## 2018-02-05 ENCOUNTER — Telehealth: Payer: Self-pay | Admitting: Hematology and Oncology

## 2018-02-05 LAB — IRON AND TIBC
Iron: 84 ug/dL (ref 41–142)
Saturation Ratios: 25 % (ref 21–57)
TIBC: 335 ug/dL (ref 236–444)
UIBC: 251 ug/dL (ref 120–384)

## 2018-02-05 LAB — FERRITIN: Ferritin: 115 ng/mL (ref 11–307)

## 2018-02-05 NOTE — Telephone Encounter (Signed)
I informed the patient that the iron studies and ferritin were normal. There is no concern for hemochromatosis. I will call her when I get the hemochromatosis gene testing but it is likely to also be normal. The elevation of ferritin noted on the previous blood work is most likely related to inflammation. She will continue with her B12 injections as I have recommended. If the gene testing is normal then we do not have to see her back for follow-up. Patient was happy to hear these results.

## 2018-02-08 LAB — HEMOCHROMATOSIS DNA-PCR(C282Y,H63D)

## 2018-02-18 ENCOUNTER — Telehealth: Payer: Self-pay | Admitting: Hematology and Oncology

## 2018-02-18 NOTE — Telephone Encounter (Signed)
Called per 1/27 sch message - unable to reach patient - left message for patient to call back to r/s appt date  And time

## 2018-02-19 ENCOUNTER — Inpatient Hospital Stay: Payer: No Typology Code available for payment source | Admitting: Hematology and Oncology

## 2018-02-28 ENCOUNTER — Telehealth: Payer: Self-pay | Admitting: Hematology and Oncology

## 2018-02-28 NOTE — Telephone Encounter (Signed)
Called pt per 2/4 sch message - unable to reach patient - left message for patient to call back to sch appt

## 2018-03-06 ENCOUNTER — Ambulatory Visit (INDEPENDENT_AMBULATORY_CARE_PROVIDER_SITE_OTHER): Payer: Self-pay | Admitting: Physician Assistant

## 2018-03-06 ENCOUNTER — Encounter: Payer: Self-pay | Admitting: Physician Assistant

## 2018-03-06 VITALS — BP 100/72 | HR 96 | Temp 98.9°F | Resp 18 | Wt 197.4 lb

## 2018-03-06 DIAGNOSIS — R6889 Other general symptoms and signs: Secondary | ICD-10-CM

## 2018-03-06 DIAGNOSIS — B379 Candidiasis, unspecified: Secondary | ICD-10-CM

## 2018-03-06 DIAGNOSIS — J019 Acute sinusitis, unspecified: Secondary | ICD-10-CM

## 2018-03-06 DIAGNOSIS — T3695XA Adverse effect of unspecified systemic antibiotic, initial encounter: Secondary | ICD-10-CM

## 2018-03-06 LAB — POCT INFLUENZA A/B
Influenza A, POC: NEGATIVE
Influenza B, POC: NEGATIVE

## 2018-03-06 MED ORDER — PSEUDOEPH-BROMPHEN-DM 30-2-10 MG/5ML PO SYRP: 5.0000 mL | ORAL_SOLUTION | Freq: Four times a day (QID) | ORAL | 0 refills | Status: DC | PRN

## 2018-03-06 MED ORDER — IPRATROPIUM BROMIDE 0.03 % NA SOLN: 2.0000 | Freq: Two times a day (BID) | NASAL | 0 refills | Status: DC

## 2018-03-06 MED ORDER — AMOXICILLIN-POT CLAVULANATE 875-125 MG PO TABS: 1.0000 | ORAL_TABLET | Freq: Two times a day (BID) | ORAL | 0 refills | Status: AC

## 2018-03-06 MED ORDER — FLUTICASONE PROPIONATE 50 MCG/ACT NA SUSP: 2.0000 | Freq: Every day | NASAL | 0 refills | Status: DC

## 2018-03-06 MED ORDER — FLUCONAZOLE 150 MG PO TABS: 150.0000 mg | ORAL_TABLET | Freq: Once | ORAL | 0 refills | Status: AC

## 2018-03-06 NOTE — Patient Instructions (Signed)
Sinusitis, Adult  Take antibiotic as prescribed. Use bromfed syrup for daytime cough.  Use flonase for nasal congestion and sneezing.  Use atrovent for runny nose.  If no improvement with treatment, contact family doctor.  If symptoms worsen or you develop new concerning symptoms, seek care at urgent care or ED.  Sinusitis is soreness and swelling (inflammation) of your sinuses. Sinuses are hollow spaces in the bones around your face. They are located:  Around your eyes.  In the middle of your forehead.  Behind your nose.  In your cheekbones. Your sinuses and nasal passages are lined with a fluid called mucus. Mucus drains out of your sinuses. Swelling can trap mucus in your sinuses. This lets germs (bacteria, virus, or fungus) grow, which leads to infection. Most of the time, this condition is caused by a virus. What are the causes? This condition is caused by:  Allergies.  Asthma.  Germs.  Things that block your nose or sinuses.  Growths in the nose (nasal polyps).  Chemicals or irritants in the air.  Fungus (rare). What increases the risk? You are more likely to develop this condition if:  You have a weak body defense system (immune system).  You do a lot of swimming or diving.  You use nasal sprays too much.  You smoke. What are the signs or symptoms? The main symptoms of this condition are pain and a feeling of pressure around the sinuses. Other symptoms include:  Stuffy nose (congestion).  Runny nose (drainage).  Swelling and warmth in the sinuses.  Headache.  Toothache.  A cough that may get worse at night.  Mucus that collects in the throat or the back of the nose (postnasal drip).  Being unable to smell and taste.  Being very tired (fatigue).  A fever.  Sore throat.  Bad breath. How is this diagnosed? This condition is diagnosed based on:  Your symptoms.  Your medical history.  A physical exam.  Tests to find out if your  condition is short-term (acute) or long-term (chronic). Your doctor may: ? Check your nose for growths (polyps). ? Check your sinuses using a tool that has a light (endoscope). ? Check for allergies or germs. ? Do imaging tests, such as an MRI or CT scan. How is this treated? Treatment for this condition depends on the cause and whether it is short-term or long-term.  If caused by a virus, your symptoms should go away on their own within 10 days. You may be given medicines to relieve symptoms. They include: ? Medicines that shrink swollen tissue in the nose. ? Medicines that treat allergies (antihistamines). ? A spray that treats swelling of the nostrils. ? Rinses that help get rid of thick mucus in your nose (nasal saline washes).  If caused by bacteria, your doctor may wait to see if you will get better without treatment. You may be given antibiotic medicine if you have: ? A very bad infection. ? A weak body defense system.  If caused by growths in the nose, you may need to have surgery. Follow these instructions at home: Medicines  Take, use, or apply over-the-counter and prescription medicines only as told by your doctor. These may include nasal sprays.  If you were prescribed an antibiotic medicine, take it as told by your doctor. Do not stop taking the antibiotic even if you start to feel better. Hydrate and humidify   Drink enough water to keep your pee (urine) pale yellow.  Use a cool mist  humidifier to keep the humidity level in your home above 50%.  Breathe in steam for 10-15 minutes, 3-4 times a day, or as told by your doctor. You can do this in the bathroom while a hot shower is running.  Try not to spend time in cool or dry air. Rest  Rest as much as you can.  Sleep with your head raised (elevated).  Make sure you get enough sleep each night. General instructions   Put a warm, moist washcloth on your face 3-4 times a day, or as often as told by your doctor.  This will help with discomfort.  Wash your hands often with soap and water. If there is no soap and water, use hand sanitizer.  Do not smoke. Avoid being around people who are smoking (secondhand smoke).  Keep all follow-up visits as told by your doctor. This is important. Contact a doctor if:  You have a fever.  Your symptoms get worse.  Your symptoms do not get better within 10 days. Get help right away if:  You have a very bad headache.  You cannot stop throwing up (vomiting).  You have very bad pain or swelling around your face or eyes.  You have trouble seeing.  You feel confused.  Your neck is stiff.  You have trouble breathing. Summary  Sinusitis is swelling of your sinuses. Sinuses are hollow spaces in the bones around your face.  This condition is caused by tissues in your nose that become inflamed or swollen. This traps germs. These can lead to infection.  If you were prescribed an antibiotic medicine, take it as told by your doctor. Do not stop taking it even if you start to feel better.  Keep all follow-up visits as told by your doctor. This is important. This information is not intended to replace advice given to you by your health care provider. Make sure you discuss any questions you have with your health care provider. Document Released: 06/28/2007 Document Revised: 06/11/2017 Document Reviewed: 06/11/2017 Elsevier Interactive Patient Education  2019 [employer].

## 2018-03-06 NOTE — Progress Notes (Signed)
MRN: [id number] DOB: 10/15/1985  Subjective:   Teresa Brennan is a 33 y.o. female presenting for chief complaint of ~1.5 week history of worsening illness.  Started out a little over a week ago with scratchy throat, nasal congestion, sinus pressure, and dry cough.  Started feeling better after about 5 days, then started feeling worse a few days ago.  Head congestion is most bothersome symptom.  Having difficulty breathing out of her nose.  Has associated body aches, sinus headache, ear fullness, scratchy throat, and cough.  Cough is mostly dry but will occasionally cough up phlegm.  Thinks it is from drainage.  Has yellowish-green color.  She works as an Therapist, sports in the oncology department and was encouraged by her boss to come get tested for the flu as a lot of people there have tested positive for the flu.  She denies fever, inability to swallow, wheezing, shortness of breath and chest pain, nausea, vomiting, abdominal pain and diarrhea has tried dayquil and NyQuil with no full relief. Denies hx of seasonal allergies, asthma, DM, and HTN. Denies smoking. Taking OCP daily. Denies any other aggravating or relieving factors, no other questions or concerns.  Review of Systems  Constitutional: Negative for diaphoresis.  Respiratory: Negative for hemoptysis and shortness of breath.   Skin: Negative for rash.  Neurological: Negative for dizziness and weakness.    Jari has a current medication list which includes the following prescription(s): cyanocobalamin, daysee, escitalopram, needles & syringes, pseudoeph-doxylamine-dm-apap, amoxicillin-clavulanate, brompheniramine-pseudoephedrine-dm, and fluticasone. Also has No Known Allergies.  Huong  has a past medical history of Anemia, Asthma, Dyspareunia, female, Fibroid, Headache(784.0), History of fatigue (03/10/11), Irregular menses, Migraine, Ovarian tumor, and Painful intercourse (11/15/10). Also  has a past surgical history that includes Hysteroscopy and Cesarean  section (N/A, 07/18/2013).   Objective:   Vitals: BP 100/72 (BP Location: Right Arm, Patient Position: Sitting, Cuff Size: Normal)   Pulse 96   Temp 98.9 F (37.2 C) (Oral)   Resp 18   Wt 197 lb 6.4 oz (89.5 kg)   SpO2 99%   BMI 31.86 kg/m   Physical Exam Vitals signs reviewed.  Constitutional:      General: She is not in acute distress.    Appearance: She is well-developed. She is not ill-appearing or toxic-appearing.  HENT:     Head: Normocephalic and atraumatic.     Right Ear: Ear canal and external ear normal. A middle ear effusion is present. No mastoid tenderness. Tympanic membrane is not erythematous or bulging.     Left Ear: Ear canal and external ear normal. A middle ear effusion is present. No mastoid tenderness. Tympanic membrane is not erythematous or bulging.     Nose: Mucosal edema (moderate b/l, decreased nasal patancy b/l) and congestion present.     Right Sinus: No maxillary sinus tenderness or frontal sinus tenderness.     Left Sinus: No maxillary sinus tenderness or frontal sinus tenderness.     Mouth/Throat:     Lips: Pink.     Mouth: Mucous membranes are moist.     Pharynx: Uvula midline. Posterior oropharyngeal erythema present.     Tonsils: No tonsillar exudate or tonsillar abscesses. Swelling: 1+ on the right. 1+ on the left.  Eyes:     Extraocular Movements: Extraocular movements intact.     Conjunctiva/sclera: Conjunctivae normal.     Pupils: Pupils are equal, round, and reactive to light.  Neck:     Musculoskeletal: Normal range of motion.  Cardiovascular:  Rate and Rhythm: Normal rate and regular rhythm.     Heart sounds: Normal heart sounds.  Pulmonary:     Effort: Pulmonary effort is normal.     Breath sounds: Normal breath sounds. No decreased breath sounds, wheezing, rhonchi or rales.  Lymphadenopathy:     Head:     Right side of head: No submental, submandibular, tonsillar, preauricular, posterior auricular or occipital adenopathy.      Left side of head: No submental, submandibular, tonsillar, preauricular, posterior auricular or occipital adenopathy.     Cervical: No cervical adenopathy.     Upper Body:     Right upper body: No supraclavicular adenopathy.     Left upper body: No supraclavicular adenopathy.  Skin:    General: Skin is warm and dry.  Neurological:     Mental Status: She is alert.     Results for orders placed or performed in visit on 03/06/18 (from the past 24 hour(s))  POCT Influenza A/B     Status: Normal   Collection Time: 03/06/18  1:49 PM  Result Value Ref Range   Influenza A, POC Negative Negative   Influenza B, POC Negative Negative    Assessment and Plan :  1. Acute sinusitis, recurrence not specified, unspecified location Patient is overall well appearing, NAD. VSS. POCT influenza test negative.  Hx concerning for secondary sickening, will treat with oral antibiotics at this time. Recommend continuing symptomatic treatment as well. Advised to f/u with family doctor if no improvement with treatment plan or to seek care sooner if symptoms worsen/develops new concerning symptoms. Patient voices understanding.  - amoxicillin-clavulanate (AUGMENTIN) 875-125 MG tablet; Take 1 tablet by mouth 2 (two) times daily for 7 days.  Dispense: 14 tablet; Refill: 0 - fluticasone (FLONASE) 50 MCG/ACT nasal spray; Place 2 sprays into both nostrils daily.  Dispense: 16 g; Refill: 0 - brompheniramine-pseudoephedrine-DM 30-2-10 MG/5ML syrup; Take 5 mLs by mouth 4 (four) times daily as needed.  Dispense: 120 mL; Refill: 0 - ipratropium (ATROVENT) 0.03 % nasal spray; Place 2 sprays into both nostrils 2 (two) times daily.  Dispense: 30 mL; Refill: 0  2. Flu-like symptoms - POCT Influenza A/B  3. Antibiotic-induced yeast infection - fluconazole (DIFLUCAN) 150 MG tablet; Take 1 tablet (150 mg total) by mouth once for 1 dose. Repeat if needed  Dispense: 2 tablet; Refill: 0   Tenna Delaine, [facility] 03/06/2018 1:50 PM

## 2018-03-08 ENCOUNTER — Telehealth: Payer: Self-pay

## 2018-03-08 NOTE — Telephone Encounter (Signed)
Patient did not answered the phone, I left a message asking to call us back.  

## 2018-04-01 MED FILL — DAYSEE 0.15-0.03-0.01 MG TA: 0.15-0.03 & | 91 days supply | Qty: 91 | Fill #2

## 2018-04-01 MED FILL — VALACYCLOVIR HCL 500 MG TAB: 500 | 15 days supply | Qty: 30 | Fill #1

## 2018-04-18 MED FILL — ESCITALOPRAM 10 MG TABLET: 10 | 90 days supply | Qty: 90 | Fill #1

## 2018-05-03 MED FILL — BD LUER-LOK SYR 3 ML 25GX5/: 25G X 5/8" | 84 days supply | Qty: 12 | Fill #0

## 2018-05-03 MED FILL — CYANOCOBALAMIN 1,000 MCG/ML: 1000 | 84 days supply | Qty: 12 | Fill #1

## 2018-06-12 ENCOUNTER — Other Ambulatory Visit: Payer: Self-pay

## 2018-06-12 ENCOUNTER — Encounter: Payer: No Typology Code available for payment source | Admitting: Family

## 2018-06-12 ENCOUNTER — Encounter: Payer: Self-pay | Admitting: Family

## 2018-06-12 ENCOUNTER — Other Ambulatory Visit (INDEPENDENT_AMBULATORY_CARE_PROVIDER_SITE_OTHER): Payer: No Typology Code available for payment source

## 2018-06-12 ENCOUNTER — Ambulatory Visit (INDEPENDENT_AMBULATORY_CARE_PROVIDER_SITE_OTHER): Payer: No Typology Code available for payment source | Admitting: Family

## 2018-06-12 VITALS — BP 108/76 | HR 93 | Temp 98.4°F | Ht 66.0 in | Wt 207.2 lb

## 2018-06-12 DIAGNOSIS — E538 Deficiency of other specified B group vitamins: Secondary | ICD-10-CM

## 2018-06-12 DIAGNOSIS — Z13220 Encounter for screening for lipoid disorders: Secondary | ICD-10-CM | POA: Diagnosis not present

## 2018-06-12 DIAGNOSIS — E559 Vitamin D deficiency, unspecified: Secondary | ICD-10-CM | POA: Diagnosis not present

## 2018-06-12 DIAGNOSIS — L709 Acne, unspecified: Secondary | ICD-10-CM

## 2018-06-12 DIAGNOSIS — Z0000 Encounter for general adult medical examination without abnormal findings: Secondary | ICD-10-CM | POA: Diagnosis not present

## 2018-06-12 LAB — COMPREHENSIVE METABOLIC PANEL
ALT: 14 U/L (ref 0–35)
AST: 14 U/L (ref 0–37)
Albumin: 3.8 g/dL (ref 3.5–5.2)
Alkaline Phosphatase: 59 U/L (ref 39–117)
BUN: 10 mg/dL (ref 6–23)
CO2: 25 mEq/L (ref 19–32)
Calcium: 8.9 mg/dL (ref 8.4–10.5)
Chloride: 105 mEq/L (ref 96–112)
Creatinine, Ser: 0.94 mg/dL (ref 0.40–1.20)
GFR: 83.63 mL/min (ref >60.00)
Glucose, Bld: 73 mg/dL (ref 70–99)
Potassium: 3.9 mEq/L (ref 3.5–5.1)
Sodium: 138 mEq/L (ref 135–145)
Total Bilirubin: 0.3 mg/dL (ref 0.2–1.2)
Total Protein: 7.2 g/dL (ref 6.0–8.3)

## 2018-06-12 LAB — LIPID PANEL
Cholesterol: 144 mg/dL (ref 0–200)
HDL: 43.7 mg/dL (ref >39.00)
LDL Cholesterol: 85 mg/dL (ref 0–99)
NonHDL: 100.55
Total CHOL/HDL Ratio: 3
Triglycerides: 76 mg/dL (ref 0.0–149.0)
VLDL: 15.2 mg/dL (ref 0.0–40.0)

## 2018-06-12 LAB — CBC WITH DIFFERENTIAL/PLATELET
Basophils Absolute: 0.1 10*3/uL (ref 0.0–0.1)
Basophils Relative: 1.1 % (ref 0.0–3.0)
Eosinophils Absolute: 0.1 10*3/uL (ref 0.0–0.7)
Eosinophils Relative: 2.3 % (ref 0.0–5.0)
HCT: 42.4 % (ref 36.0–46.0)
Hemoglobin: 14.8 g/dL (ref 12.0–15.0)
Lymphocytes Relative: 38.7 % (ref 12.0–46.0)
Lymphs Abs: 2.3 10*3/uL (ref 0.7–4.0)
MCHC: 34.8 g/dL (ref 30.0–36.0)
MCV: 84 fl (ref 78.0–100.0)
Monocytes Absolute: 0.5 10*3/uL (ref 0.1–1.0)
Monocytes Relative: 7.8 % (ref 3.0–12.0)
Neutro Abs: 3 10*3/uL (ref 1.4–7.7)
Neutrophils Relative %: 50.1 % (ref 43.0–77.0)
Platelets: 299 10*3/uL (ref 150.0–400.0)
RBC: 5.05 Mil/uL (ref 3.87–5.11)
RDW: 12.6 % (ref 11.5–15.5)
WBC: 6 10*3/uL (ref 4.0–10.5)

## 2018-06-12 LAB — VITAMIN D 25 HYDROXY (VIT D DEFICIENCY, FRACTURES): VITD: 38.62 ng/mL (ref 30.00–100.00)

## 2018-06-12 LAB — VITAMIN B12: Vitamin B-12: 682 pg/mL (ref 211–911)

## 2018-06-12 MED ORDER — ESCITALOPRAM OXALATE 10 MG PO TABS: 10.0000 mg | ORAL_TABLET | Freq: Every day | ORAL | 1 refills | Status: DC

## 2018-06-12 NOTE — Progress Notes (Signed)
Teresa Brennan is a 33 y.o. female with the following history as recorded in [employer]:  Patient Active Problem List   Diagnosis Date Noted  . Elevated ferritin 02/04/2018  . History of anemia 05/15/2015  . Status post primary low transverse cesarean section--NRFHR 07/20/2013  . Uterine fibroid 07/17/2013  . 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 Cervical high risk HPV (human papillomavirus) test positive 07/17/2013  . Pernicious anemia 10/03/2011  . Vitamin B12 deficiency 03/10/2011  . Anemia 03/10/2011    Current Outpatient Medications  Medication Sig Dispense Refill  . cyanocobalamin (,VITAMIN B-12,) 1000 MCG/ML injection Inject 1 mL (1,000 mcg total) into the muscle once a week. 30 mL 3  . DAYSEE 0.15-0.03 &0.01 MG tablet   4  . escitalopram (LEXAPRO) 10 MG tablet Take 1 tablet (10 mg total) by mouth daily. 90 tablet 1  . fluticasone (FLONASE) 50 MCG/ACT nasal spray Place 2 sprays into both nostrils daily. 16 g 0  . ipratropium (ATROVENT) 0.03 % nasal spray Place 2 sprays into both nostrils 2 (two) times daily. 30 mL 0  . Needles & Syringes MISC 1 Syringe by Does not apply route once a week. 30 each 3   No current facility-administered medications for this visit.     Allergies: Patient has no known allergies.  Past Medical History:  Diagnosis Date  . Anemia   . Asthma   . Dyspareunia, female   . Fibroid   . Headache(784.0)   . History of fatigue 03/10/11  . Irregular menses   . Migraine   . Ovarian tumor   . Painful intercourse 11/15/10    Past Surgical History:  Procedure Laterality Date  . CESAREAN SECTION N/A 07/18/2013   Procedure: CESAREAN SECTION;  Surgeon: Ena Dawley, MD;  Location: [facility];  Service: Obstetrics;  Laterality: N/A;  . HYSTEROSCOPY      Family History  Adopted: Yes  Problem Relation Age of Onset  . Sickle cell trait Daughter     Social History   Tobacco Use  . Smoking status: Never Smoker  .  Smokeless tobacco: Never Used  Substance Use Topics  . Alcohol use: No    Alcohol/week: 0.0 standard drinks    Subjective:  Patient presents for yearly CPE; in baseline state of health today; under care of GYN- up to date on pap smear; LMP- continual cycling Sees dentist and eye doctor regularly; Feels weight is up today- not exercising regularly due to [place]; knows she need to start a regular exercise routine and work on weight loss goals  Review of Systems  Constitutional: Negative.   HENT: Negative.   Eyes: Negative.   Respiratory: Negative.   Cardiovascular: Negative.   Gastrointestinal: Negative.   Genitourinary: Negative.   Musculoskeletal: Negative.   Skin: Negative.   Neurological: Negative.   Endo/Heme/Allergies: Negative.   Psychiatric/Behavioral: Negative.        Objective:  Vitals:   06/12/18 1100  BP: 108/76  Pulse: 93  Temp: 98.4 F (36.9 C)  TempSrc: Oral  SpO2: 99%  Weight: 207 lb 3.2 oz (94 kg)  Height: '5\' 6"'  (1.676 m)    General: Well developed, well nourished, in no acute distress  Skin : Warm and dry.  Head: Normocephalic and atraumatic  Eyes: Sclera and conjunctiva clear; pupils round and reactive to light; extraocular movements intact  Ears: External normal; canals clear; tympanic membranes normal  Oropharynx: Pink, supple. No suspicious lesions  Neck: Supple without thyromegaly, adenopathy  Lungs: Respirations unlabored; clear to auscultation bilaterally without wheeze, rales, rhonchi  CVS exam: normal rate and regular rhythm.  Abdomen: Soft; nontender; nondistended; normoactive bowel sounds; no masses or hepatosplenomegaly  Musculoskeletal: No deformities; no active joint inflammation  Extremities: No edema, cyanosis, clubbing  Vessels: Symmetric bilaterally  Neurologic: Alert and oriented; speech intact; face symmetrical; moves all extremities well; CNII-XII intact without focal deficit   Assessment:  1. PE (physical exam), annual    2. Adult acne   3. Lipid screening   4. Vitamin D deficiency   5. Vitamin B12 deficiency     Plan:  Age appropriate preventive healthcare needs addressed; encouraged regular eye doctor and dental exams; encouraged regular exercise; will update labs and refills as needed today; follow-up to be determined; Encouraged to work on losing 20 pounds over the next 6 months and then will re-evaluate weight goals;  Keep planned follow-up with GYN;   No follow-ups on file.  Orders Placed This Encounter  Procedures  . CBC w/Diff    Standing Status:   Future    Number of Occurrences:   1    Standing Expiration Date:   06/12/2019  . Comp Met (CMET)    Standing Status:   Future    Number of Occurrences:   1    Standing Expiration Date:   06/12/2019  . Lipid panel    Standing Status:   Future    Number of Occurrences:   1    Standing Expiration Date:   06/12/2019  . Vitamin D (25 hydroxy)    Standing Status:   Future    Number of Occurrences:   1    Standing Expiration Date:   06/12/2019  . B12    Standing Status:   Future    Number of Occurrences:   1    Standing Expiration Date:   06/12/2019  . Ambulatory referral to Dermatology    Referral Priority:   Routine    Referral Type:   Consultation    Referral Reason:   Specialty Services Required    Requested Specialty:   Dermatology    Number of Visits Requested:   1    Requested Prescriptions   Signed Prescriptions Disp Refills  . escitalopram (LEXAPRO) 10 MG tablet 90 tablet 1    Sig: Take 1 tablet (10 mg total) by mouth daily.

## 2018-06-18 MED FILL — DAYSEE 0.15-0.03-0.01 MG TA: 0.15-0.03 & | 91 days supply | Qty: 91 | Fill #3

## 2018-07-22 MED FILL — ESCITALOPRAM 10 MG TABLET: 10 | 90 days supply | Qty: 90 | Fill #0

## 2018-07-22 MED FILL — VALACYCLOVIR HCL 500 MG TAB: 500 | 15 days supply | Qty: 30 | Fill #2

## 2018-07-31 ENCOUNTER — Encounter: Payer: No Typology Code available for payment source | Attending: Family | Admitting: Registered"

## 2018-07-31 ENCOUNTER — Encounter: Payer: Self-pay | Admitting: Registered"

## 2018-07-31 ENCOUNTER — Other Ambulatory Visit: Payer: Self-pay

## 2018-07-31 DIAGNOSIS — Z713 Dietary counseling and surveillance: Secondary | ICD-10-CM | POA: Insufficient documentation

## 2018-07-31 NOTE — Progress Notes (Signed)
Medical Nutrition Therapy:  Appt start time: 0811 end time:  0911.  Assessment:  Primary concerns today: [employer] Employee Visit #1. Pt present for appointment alone. Pt reports she knows she is overweight and has been told she needs to lose weight. Reports she has struggled with losing weight and since having her daughter has had a hard time getting on a schedule. Reports she still drinks some soda and knows she needs to cut it out and needs to drink more water but has a hard time making these changes. Reports she started Lexapro last December and feels that has caused her to gain more weight. Reports some increase in appetite since starting the medication. Reports if at home she will eat a lot but while working often only eats 2 times daily. Reports she wants to snack a lot once she's at home. Reports she often will have a caffeinated soda close to bedtime- around 9-10 PM and feels that is why she often has to get up during the night to urinate. 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 Pt reports she feels meal prepping would be helpful for her because it does not go well when she does not plan what to eat ahead of time.   Sleep Routine: Reports she does not sleep well. Reports she gets daughter up during the night 2 times to help prevent accidents. Reports she wakes several times. Reports she drinks caffeinated beverages close to bedtime. Reports she goes to sleep around 10-11 PM and wakes up at different times d/t scheduled changes.   Preferred Learning Style:   No preference indicated   Learning Readiness:   Ready  MEDICATIONS: See list. Reviewed.    DIETARY INTAKE:  Usual eating pattern includes 2 meals and 3 snacks per day. Typically skips breakfast.   Everyday foods include chicken.  Avoided foods include quinoa, several vegetables-corn, carrots, brussels sprouts, tomatoes. Reports she grew up eating vegetables but does not like most now. Will  eat lettuce, spinach, green beans, cooked broccoli, zucchini, and cucumbers.      Typical Snack Foods: soda, chips, vending machine foods.  Locations of Meals: couch or bed.   Electronics Present: Yes: TV, phone.   Typical Beverages: caffienated beverages; Snapple Juice, sweet tea. Typical water intake ~12 oz per day.   24-hr recall:  B (9 AM): bowl of Lucky Charms cereal with whole milk  Snk ( AM): None reported.  L ( PM): None reported.  Snk ( PM): None reported.  D (6 PM): sausage, potato and cheese casserole, sherbet, Snapple, water  Snk ( PM): sherbet  Beverages: 12-16 oz Snapple; 8 oz water; 8 oz Ginger Ale INs  Usual physical activity: None currently. Reports she used to do workout program before having her daughter. Reports she still has plan but it is hard to follow because it is very intense and takes about 2 hours to complete. Reports she has considered buying a treadmill. Also reports there is a school near by she could walk at.   Progress Towards Goal(s):  In progress.   Nutritional Diagnosis:  NI-5.11.1 Predicted suboptimal nutrient intake As related to skipping meals and inadequate intake of vegetables .  As evidenced by reported dietary recall and habits .    Intervention:  Nutrition counseling provided. Dietitian provided education regarding balanced nutrition and mindful eating. Discussed importance of focusing on healthy habits rather than focusing on weight. Worked with pt to set pt lead  nutrition and activity goals. Pt reports she would like to start with goals to meal prep, increase water intake, and purchase exercise equipment to use in home. Pt appeared agreeable to information/goals discussed.   Instructions/Goals:   Make sure to get in three meals per day. Try to have balanced meals like the My Plate example (see handout). Include lean proteins, vegetables, fruits, and whole grains at meals.   Goal #1: Plan out 3 meals per day.    Use the balanced plate to  plan out balanced meals.  When schedule is received, plan days for meal prep and grocery shopping and write down a time to complete on your calendar  Practice Mindful Eating  At meal and snack times, put away electronics (TV, phone, tablet, etc.) and try to eat seated at a table so you can better focus on eating your meal/snack and promote listening to your body's fullness and hunger signals.  If you feel that you are wanting to snack because your are bored or due to emotions and not because you are hungry, try to do a fun activity (read a book, take a walk, talk with a friend, etc.) for at least 20 minutes.    Goal #2: Increase Water Intake   Get in at least 32 oz water as a start   Purchase water bottle that holds at least 32 oz   Carry water bottle with you  Set reminders on phone to drink  Make physical activity a part of your week. Try to include at least 30 minutes of physical activity 5 days each week or at least 150 minutes per week. Regular physical activity promotes overall health-including helping to reduce risk for heart disease and diabetes, promoting mental health, and helping [place] sleep better.  Goal #3: Purchase a piece of exercise equipment you can use well in doors. Recommend checking second hand sporting equipment stores.     Teaching Method Utilized:  Visual Auditory  Handouts given during visit include:  Balanced plate and food list.  Barriers to learning/adherence to lifestyle change: None reported.   Demonstrated degree of understanding via:  Teach Back   Monitoring/Evaluation:  Dietary intake, exercise, and body weight in 1 week(s).

## 2018-07-31 NOTE — Patient Instructions (Addendum)
Instructions/Goals:   Make sure to get in three meals per day. Try to have balanced meals like the My Plate example (see handout). Include lean proteins, vegetables, fruits, and whole grains at meals.   Goal #1: Plan out 3 meals per day.    Use the balanced plate to plan out balanced meals.  When schedule is received, plan days for meal prep and grocery shopping and write down a time to complete on your calendar  Practice Mindful Eating  At meal and snack times, put away electronics (TV, phone, tablet, etc.) and try to eat seated at a table so you can better focus on eating your meal/snack and promote listening to your body's fullness and hunger signals.  If you feel that you are wanting to snack because your are bored or due to emotions and not because you are hungry, try to do a fun activity (read a book, take a walk, talk with a friend, etc.) for at least 20 minutes.    Goal #2: Increase Water Intake   Get in at least 32 oz water as a start   Purchase water bottle that holds at least 32 oz   Carry water bottle with you  Set reminders on phone to drink  Make physical activity a part of your week. Try to include at least 30 minutes of physical activity 5 days each week or at least 150 minutes per week. Regular physical activity promotes overall health-including helping to reduce risk for heart disease and diabetes, promoting mental health, and helping [place] sleep better.  Goal #3: Purchase a piece of exercise equipment you can use well in doors. Recommend checking second hand sporting equipment stores.

## 2018-08-08 ENCOUNTER — Other Ambulatory Visit: Payer: Self-pay

## 2018-08-08 ENCOUNTER — Encounter: Payer: No Typology Code available for payment source | Admitting: Registered"

## 2018-08-08 DIAGNOSIS — Z713 Dietary counseling and surveillance: Secondary | ICD-10-CM

## 2018-08-08 NOTE — Progress Notes (Signed)
Medical Nutrition Therapy:  Appt start time: 9381 end time:  1215.  Assessment:  Primary concerns today: [employer] Employee Visit #2. Pt reports things are going well overall but she has not increased water intake. Reports she needs water to be cold in order to drink it. Reports she fell off track Monday d/t going out with her sister. Reports she has been prepping meals some but usually does it on the same day or night before and being out all day with her sister caused her not to have time for meal prep. She reports having access to a refrigerator where she could store food in case she forgets to pack a lunch. Reports she feels wheat thins, yogurt, and fruit could be kept at work. Reports she has increased vegetable intake, but feels she needs to watch sauces and cheese. Reports she has looked into exercise equipment and has told her family/friends to give her money in lieu of gifts for her birthday so she can buy an exercise machine. Reports her goal is to have one by end of August. Reports she has not done any other physical activities. Reports her sister told her they could start walking together. Reports she may do that in mean time until she purchases the equipment.   Sleep Routine: Reports she does not sleep well. Reports she gets daughter up during the night 2 times to help prevent accidents. Reports she wakes several times. Reports she drinks caffeinated beverages close to bedtime. Reports she goes to sleep around 10-11 PM and wakes up at different times d/t scheduled changes.   Preferred Learning Style:   No preference indicated   Learning Readiness:   Ready  MEDICATIONS: See list. Reviewed.    DIETARY INTAKE:  Usual eating pattern includes 2 meals and 3 snacks per day. Typically skips breakfast.   Everyday foods include chicken.  Avoided foods include quinoa, several vegetables-corn, carrots, brussels sprouts, tomatoes. Reports she grew up eating vegetables but does not like most now. Will  eat lettuce, spinach, green beans, cooked broccoli, zucchini, and cucumbers.      Typical Snack Foods: soda, chips, vending machine foods.  Locations of Meals: couch or bed.   Electronics Present: Yes: TV, phone.   Typical Beverages: caffienated beverages; Snapple Juice, sweet tea. Typical water intake ~12 oz per day.   24-hr recall:  B (9 AM): [employer] 4 count breaded mini, hashbrowns, small sweet tea  Snk ( AM): None reported.  L ( PM): None reported. (at work)  Snk ( PM): None reported.  D (930-10 PM): chicken and spinach bake with cheese, mayo, sour cream, broccoli with cheese Snk ( PM):  Beverages: typically ~20 oz water; Snapple; sweet tea.   Usual physical activity: None currently. Reports she used to do workout program before having her daughter. Reports she still has plan but it is hard to follow because it is very intense and takes about 2 hours to complete. Reports she has considered buying a treadmill. Also reports there is a school near by she could walk at.   Progress Towards Goal(s):  In progress.   Nutritional Diagnosis:  NI-5.11.1 Predicted suboptimal nutrient intake As related to skipping meals and inadequate intake of vegetables .  As evidenced by reported dietary recall and habits .    Intervention:  Nutrition counseling provided. Dietitian praised pt for working to include more vegetables and prepping part of the time. Discussed that it is good she has already started making some changes as the  last appointment was only 8 days ago. Discussed continuing to work on goal of meal prepping ahead of time to help avoid skipping meals at work. Discussed also keeping healthy snacks/foods at work as a plan B in case she missing packing one day. Encouraged pt to purchase a insulated water bottle to keep water cold for extended period to help with water intake. Recommended logging physical activity in [place] for motivation to increase activity minutes. Pt  wanted to know if she should extend next appointment out further to help with progress. Recommended extending to next month to provide more time to work on discussed goals. Pt appeared agreeable to information/goals discussed.   Instructions/Goals:  Make sure to get in three meals per day. Try to have balanced meals like the My Plate example (see handout). Include lean proteins, vegetables, fruits, and whole grains at meals.   Continued Goal #1: Plan out 3 meals per day   Use the balanced plate to plan out balanced meals.  When schedule is received, plan days for meal prep and grocery shopping and write down a time to complete on your calendar  Goal #1.5: Have balanced snacks at work as plan B in case you don't have meal packed for that day. Recommend including a complex carbohydrate or vegetable (whole grains, fruits, raw vegetables) and a protein (Greek yogurt or regular yogurt with protein source added, low fat cheese, boiled eggs, peanut butter, sunflower or pumpkin seeds, nuts, etc).    Practice Mindful Eating  At meal and snack times, put away electronics (TV, phone, tablet, etc.) and try to eat seated at a table so you can better focus on eating your meal/snack and promote listening to your body's fullness and hunger signals.  If you feel that you are wanting to snack because your are bored or due to emotions and not because you are hungry, try to do a fun activity (read a book, take a walk, talk with a friend, etc.) for at least 20 minutes.    Goal #2: Increase Water Intake   Get in at least 32 oz water as a start   Purchase water bottle that holds at least 32 oz. Recommend getting an insulated water bottle to keep water cold.  Carry water bottle with you  Set reminders on phone to drink  Make physical activity a part of your week. Try to include at least 30 minutes of physical activity 5 days each week or at least 150 minutes per week. Regular physical activity promotes  overall health-including helping to reduce risk for heart disease and diabetes, promoting mental health, and helping [place] sleep better.  Goal #3: Purchase a piece of exercise equipment you can use well in doors. Recommend checking second hand sporting equipment stores.    Recommend looking into Healthy Rewards Program through [facility] to log physical activity and increase motivation to be active.     Teaching Method Utilized:  Visual Auditory  Barriers to learning/adherence to lifestyle change: None reported.   Demonstrated degree of understanding via:  Teach Back   Monitoring/Evaluation:  Dietary intake, exercise, and body weight in 1 month(s).

## 2018-08-08 NOTE — Patient Instructions (Addendum)
Instructions/Goals:   Make sure to get in three meals per day. Try to have balanced meals like the My Plate example (see handout). Include lean proteins, vegetables, fruits, and whole grains at meals.   Continued Goal #1: Plan out 3 meals per day   Use the balanced plate to plan out balanced meals.  When schedule is received, plan days for meal prep and grocery shopping and write down a time to complete on your calendar  Goal #1.5: Have balanced snacks at work as plan B in case you don't have meal packed for that day. Recommend including a complex carbohydrate or vegetable (whole grains, fruits, raw vegetables) and a protein (Greek yogurt or regular yogurt with protein source added, low fat cheese, boiled eggs, peanut butter, sunflower or pumpkin seeds, nuts, etc).    Practice Mindful Eating  At meal and snack times, put away electronics (TV, phone, tablet, etc.) and try to eat seated at a table so you can better focus on eating your meal/snack and promote listening to your body's fullness and hunger signals.  If you feel that you are wanting to snack because your are bored or due to emotions and not because you are hungry, try to do a fun activity (read a book, take a walk, talk with a friend, etc.) for at least 20 minutes.    Goal #2: Increase Water Intake   Get in at least 32 oz water as a start   Purchase water bottle that holds at least 32 oz. Recommend getting an insulated water bottle to keep water cold.  Carry water bottle with you  Set reminders on phone to drink  Make physical activity a part of your week. Try to include at least 30 minutes of physical activity 5 days each week or at least 150 minutes per week. Regular physical activity promotes overall health-including helping to reduce risk for heart disease and diabetes, promoting mental health, and helping [place] sleep better.  Goal #3: Purchase a piece of exercise equipment you can use well in doors. Recommend checking  second hand sporting equipment stores.    Recommend looking into Healthy Rewards Program through [facility] to log physical activity and increase motivation to be active.

## 2018-08-09 ENCOUNTER — Encounter: Payer: Self-pay | Admitting: Registered"

## 2018-08-19 MED FILL — SPIRONOLACTONE 100 MG TAB: 100 | 30 days supply | Qty: 30 | Fill #0

## 2018-09-05 ENCOUNTER — Other Ambulatory Visit: Payer: Self-pay

## 2018-09-05 ENCOUNTER — Encounter: Payer: Self-pay | Admitting: Registered"

## 2018-09-05 ENCOUNTER — Encounter: Payer: No Typology Code available for payment source | Attending: Family | Admitting: Registered"

## 2018-09-05 DIAGNOSIS — Z713 Dietary counseling and surveillance: Secondary | ICD-10-CM | POA: Insufficient documentation

## 2018-09-05 NOTE — Progress Notes (Signed)
Medical Nutrition Therapy:  Appt start time: 0952 end time: 1022.  Assessment:  Primary concerns today: [employer] Employee Visit #3.   Pt present for third and final [place] Employee visit. Pt reports she has fell off track some with her nutrition. Reports she did get a 32 oz insulated water bottle and reports it is working really well. Reports it keeps her water cold and she has been including at least 32 oz water daily. Reports she bought a treadmill and just got it set up. She plans to start using it next week and plans to start with walking and work up to jogging on it. Reports she still been trying to get in 3 meals but usually skips breakfast d/t lack of time. Pt reports she needs quick on the go breakfast ideas because with her current work schedule she does not get up early enough to prepare a breakfast and eat before leaving for work. Reports she has thought about doing a protein bar. Reports at work she has been getting something from the cafeteria or [employer] if unable to pack lunch. Gets in a protein and vegetable at least. She reports she wants to work to have 3 meals and 1-2 snacks to keep metabolism going. Pt reports she often gets off track if her day gets busy and she is on the go a lot. Pt reports that she really likes bread and that makes things difficult.   Sleep Routine: Reports she does not sleep well. Reports she gets daughter up during the night 2 times to help prevent accidents. Reports she wakes several times. Reports she drinks caffeinated beverages close to bedtime. Reports she goes to sleep around 10-11 PM and wakes up at different times d/t scheduled changes.   Preferred Learning Style:   No preference indicated   Learning Readiness:   Ready  MEDICATIONS: See list. Reviewed.    DIETARY INTAKE:  Usual eating pattern includes 2-3 meals and 1-2 snacks per day. Typically skips breakfast d/t lack of time.   Everyday foods include chicken.  Avoided foods include  quinoa, several vegetables-corn, carrots, brussels sprouts, tomatoes. Reports she grew up eating vegetables but does not like most now. Will eat lettuce, spinach, green beans, cooked broccoli, zucchini, and cucumbers.      Typical Snack Foods: soda, chips, vending machine foods.  Locations of Meals: couch or bed.   Electronics Present: Yes: TV, phone.   Typical Beverages: caffienated beverages; Snapple Juice, sweet tea. Typical water intake ~12 oz per day.   24-hr recall:  B ( AM): Honey Nut Cheerios  Snk ( AM): None reported.  L (1230-1 PM): 1 egg roll, water  Snk ( PM): None reported.  D ( PM): sausage dog and fries, sweet tea (reports she was out with daughter and stopped for food) Snk ( PM): None reported.  Beverages: water, sweet tea; milk in cereal   Usual physical activity: None currently. Pt recently purchased treadmill and plans to start using it next week.   Progress Towards Goal(s):  In progress.   Nutritional Diagnosis:  [id number] Predicted suboptimal nutrient intake As related to skipping meals and inadequate intake of vegetables .  As evidenced by reported dietary recall and habits .    Intervention:  Nutrition counseling provided. Dietitian praised pt for working to increase water intake and purchasing a treadmill. Discussed ideas for on-the-go breakfasts and what to look for in a protein bar. Discussed keeping healthy snacks on hand for when pt gets busy and  meals are more prolonged. Discussed that bread can be included as part of a balanced diet-discussed having it in balance with other food groups and how to have in moderation. Discussed thinking about what starch she wants to have with a meal and if having bread may leave out others starches or vice versa or could include 2 starches with reduced portions of each. Encouraged pt to continue working toward goals. Pt appeared agreeable to information/goals discussed.   Instructions/Goals:  Make sure to get in three meals  per day. Try to have balanced meals like the My Plate example (see handout). Include lean proteins, vegetables, fruits, and whole grains at meals.  Continued Goal #1: Plan out 3 meals per day  Quick On the Go Breakfast Ideas:   Greek yogurt drink (at least 10 g protein, can compare and choose those with lower sugar content)  Protein bar (at least 8 g protein and 3 g fiber per serving)   Goal #1.5: Have balanced snacks at work and in purse for times you get busy and may go a longer time between meals. Recommend including a complex carbohydrate or vegetable (whole grains, fruits, raw vegetables) and a protein (Greek yogurt or regular yogurt with protein source added, low fat cheese, boiled eggs if refrigerator present at work and if no refrigeration:  peanut butter, sunflower or pumpkin seeds, nuts, protein bars, etc).    Practice Mindful Eating  At meal and snack times, put away electronics (TV, phone, tablet, etc.) and try to eat seated at a table so you can better focus on eating your meal/snack and promote listening to your body's fullness and hunger signals.  If you feel that you are wanting to snack because your are bored or due to emotions and not because you are hungry, try to do a fun activity (read a book, take a walk, talk with a friend, etc.) for at least 20 minutes.    Goal #2: Increase Water Intake   Great job increasing water intake to at least 32 oz!   Water Goal: at least 64 oz per day  Make physical activity a part of your week. Try to include at least 30 minutes of physical activity 5 days each week or at least 150 minutes per week. Regular physical activity promotes overall health-including helping to reduce risk for heart disease and diabetes, promoting mental health, and helping [place] sleep better.  Goal #3: Great job getting your treadmill! Gradually work to include at least 30 minutes x 5 days per week  Recommend checking out the [place] through [place] to  log physical activity and increase motivation to be active.       Teaching Method Utilized:  Visual Auditory  Barriers to learning/adherence to lifestyle change: None reported.   Demonstrated degree of understanding via:  Teach Back   Monitoring/Evaluation:  Dietary intake, exercise, and body weight prn.

## 2018-09-05 NOTE — Patient Instructions (Addendum)
Instructions/Goals:  Make sure to get in three meals per day. Try to have balanced meals like the My Plate example (see handout). Include lean proteins, vegetables, fruits, and whole grains at meals.  Continued Goal #1: Plan out 3 meals per day  Quick On the Go Breakfast Ideas:   Greek yogurt drink (at least 10 g protein, can compare and choose those with lower sugar content)  Protein bar (at least 8 g protein and 3 g fiber per serving)   Goal #1.5: Have balanced snacks at work and in purse for times you get busy and may go a longer time between meals. Recommend including a complex carbohydrate or vegetable (whole grains, fruits, raw vegetables) and a protein (Greek yogurt or regular yogurt with protein source added, low fat cheese, boiled eggs if refrigerator present at work and if no refrigeration:  peanut butter, sunflower or pumpkin seeds, nuts, protein bars, etc).    Practice Mindful Eating  At meal and snack times, put away electronics (TV, phone, tablet, etc.) and try to eat seated at a table so you can better focus on eating your meal/snack and promote listening to your body's fullness and hunger signals.  If you feel that you are wanting to snack because your are bored or due to emotions and not because you are hungry, try to do a fun activity (read a book, take a walk, talk with a friend, etc.) for at least 20 minutes.    Goal #2: Increase Water Intake   Great job increasing water intake to at least 32 oz!   Water Goal: at least 64 oz per day  Make physical activity a part of your week. Try to include at least 30 minutes of physical activity 5 days each week or at least 150 minutes per week. Regular physical activity promotes overall health-including helping to reduce risk for heart disease and diabetes, promoting mental health, and helping [place] sleep better.  Goal #3: Great job getting your treadmill! Gradually work to include at least 30 minutes x 5 days per week  Recommend  checking out the [place] through [place] to log physical activity and increase motivation to be active.

## 2018-09-18 MED FILL — ASHLYNA 0.15-0.03-0.01 MG T: 0.15-0.03 & | 91 days supply | Qty: 91 | Fill #4

## 2018-10-04 MED FILL — CYANOCOBALAMIN 1,000 MCG/ML: 1000 | 84 days supply | Qty: 12 | Fill #2

## 2018-10-04 MED FILL — SPIRONOLACTONE 100 MG TAB: 100 | 30 days supply | Qty: 30 | Fill #1

## 2018-10-08 MED FILL — AZELAIC ACID 15 % GEL: 15 | 14 days supply | Qty: 50 | Fill #0

## 2018-10-21 MED FILL — SPIRONOLACTONE 100 MG TAB: 100 | 30 days supply | Qty: 45 | Fill #0

## 2018-11-14 MED FILL — MYORISAN 40 MG CAPSULE: 40 | 30 days supply | Qty: 30 | Fill #0

## 2018-12-09 MED FILL — VALACYCLOVIR HCL 500 MG TAB: 500 | 30 days supply | Qty: 30 | Fill #0

## 2018-12-09 MED FILL — LEVONOR-ETH ESTRAD 0.15-0.0: 0.15-30 | 84 days supply | Qty: 112 | Fill #0

## 2018-12-10 MED FILL — DAYSEE 0.15-0.03-0.01 MG TA: 0.15-0.03 & | 91 days supply | Qty: 91 | Fill #0

## 2018-12-17 MED FILL — TRIAMCINOLONE 0.1% OINTMENT: 0.1 | 15 days supply | Qty: 80 | Fill #0

## 2018-12-17 MED FILL — MYORISAN 30 MG CAPSULE: 30 | 30 days supply | Qty: 30 | Fill #0

## 2019-01-20 ENCOUNTER — Other Ambulatory Visit: Payer: Self-pay | Admitting: Family

## 2019-01-20 MED FILL — VALACYCLOVIR HCL 500 MG TAB: 500 | 30 days supply | Qty: 30 | Fill #1

## 2019-01-21 MED FILL — MYORISAN 30 MG CAPSULE: 30 | 30 days supply | Qty: 30 | Fill #0

## 2019-01-21 MED FILL — ESCITALOPRAM 10 MG TABLET: 10 | 90 days supply | Qty: 90 | Fill #0

## 2019-01-21 NOTE — Telephone Encounter (Signed)
Patient called to check the status of her refill request

## 2019-02-20 MED FILL — MYORISAN 30 MG CAPSULE: 30 | 30 days supply | Qty: 30 | Fill #0

## 2019-02-24 MED FILL — VALACYCLOVIR HCL 500 MG TAB: 500 | 30 days supply | Qty: 30 | Fill #2

## 2019-03-10 ENCOUNTER — Other Ambulatory Visit: Payer: Self-pay | Admitting: Hematology and Oncology

## 2019-03-10 MED FILL — ASHLYNA 0.15-0.03-0.01 MG T: 0.15-0.03 & | 91 days supply | Qty: 91 | Fill #1

## 2019-03-10 MED FILL — CYANOCOBALAMIN 1,000 MCG/ML: 1000 | 84 days supply | Qty: 12 | Fill #0

## 2019-03-10 MED FILL — DENTAGEL 1.1% GEL: 1.1 | 30 days supply | Qty: 56 | Fill #0

## 2019-03-24 MED FILL — VALACYCLOVIR HCL 500 MG TAB: 500 | 30 days supply | Qty: 30 | Fill #3

## 2019-03-26 MED FILL — MYORISAN 30 MG CAPSULE: 30 | 30 days supply | Qty: 30 | Fill #0

## 2019-03-28 ENCOUNTER — Other Ambulatory Visit: Payer: Self-pay

## 2019-03-28 ENCOUNTER — Ambulatory Visit (INDEPENDENT_AMBULATORY_CARE_PROVIDER_SITE_OTHER): Payer: No Typology Code available for payment source | Admitting: Family

## 2019-03-28 ENCOUNTER — Encounter: Payer: Self-pay | Admitting: Family

## 2019-03-28 VITALS — BP 116/80 | HR 86 | Temp 98.0°F | Ht 66.5 in | Wt 196.6 lb

## 2019-03-28 DIAGNOSIS — H9312 Tinnitus, left ear: Secondary | ICD-10-CM

## 2019-03-28 NOTE — Progress Notes (Signed)
Teresa Brennan is a 34 y.o. female with the following history as recorded in EpicCare:  Patient Active Problem List   Diagnosis Date Noted  . Elevated ferritin 02/04/2018  . History of anemia 05/15/2015  . Status post primary low transverse cesarean section--NRFHR 07/20/2013  . Uterine fibroid 07/17/2013  . 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 Cervical high risk HPV (human papillomavirus) test positive 07/17/2013  . Pernicious anemia 10/03/2011  . Vitamin B12 deficiency 03/10/2011  . Anemia 03/10/2011    Current Outpatient Medications  Medication Sig Dispense Refill  . cyanocobalamin (,VITAMIN B-12,) 1000 MCG/ML injection INJECT 1 ML INTO THE MUSCLE ONCE A WEEK 12 mL 3  . DAYSEE 0.15-0.03 &0.01 MG tablet   4  . DENTAGEL 1.1 % GEL dental gel     . escitalopram (LEXAPRO) 10 MG tablet TAKE 1 TABLET (10 MG TOTAL) BY MOUTH DAILY. 90 tablet 1  . MYORISAN 30 MG capsule Take 30 mg by mouth daily.    . Needles & Syringes MISC 1 Syringe by Does not apply route once a week. 30 each 3  . VITAMIN D PO Take 2,000 Int'l Units by mouth.    . fluticasone (FLONASE) 50 MCG/ACT nasal spray Place 2 sprays into both nostrils daily. (Patient not taking: Reported on 03/28/2019) 16 g 0  . ipratropium (ATROVENT) 0.03 % nasal spray Place 2 sprays into both nostrils 2 (two) times daily. (Patient not taking: Reported on 03/28/2019) 30 mL 0  . triamcinolone ointment (KENALOG) 0.1 %     . valACYclovir (VALTREX) 500 MG tablet Take 500 mg by mouth daily.     No current facility-administered medications for this visit.    Allergies: Patient has no known allergies.  Past Medical History:  Diagnosis Date  . Anemia   . Asthma   . Dyspareunia, female   . Fibroid   . Headache(784.0)   . History of fatigue 03/10/11  . Irregular menses   . Migraine   . Ovarian tumor   . Painful intercourse 11/15/10    Past Surgical History:  Procedure Laterality Date  . CESAREAN SECTION N/A  07/18/2013   Procedure: CESAREAN SECTION;  Surgeon: Ena Dawley, MD;  Location: [facility];  Service: Obstetrics;  Laterality: N/A;  . HYSTEROSCOPY      Family History  Adopted: Yes  Problem Relation Age of Onset  . Sickle cell trait Daughter     Social History   Tobacco Use  . Smoking status: Never Smoker  . Smokeless tobacco: Never Used  Substance Use Topics  . 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  Head: Normocephalic and atraumatic  Eyes: Sclera and conjunctiva clear; pupils round and reactive to light; extraocular movements intact  Ears: External normal; canals clear; tympanic membranes normal  Oropharynx: Pink, supple. No suspicious lesions  Neck: Supple without thyromegaly, adenopathy  Lungs: Respirations unlabored; clear to auscultation bilaterally without wheeze, rales, rhonchi  Neurologic: Alert and oriented; speech intact; face symmetrical; moves all extremities well; CNII-XII intact without focal deficit   Assessment:  1. Tinnitus of left ear     Plan:  Exam unremarkable; try using Flonase; can refer to ENT if symptoms persist.  This visit occurred during the SARS-CoV-2 public health emergency.  Safety protocols were in place, including screening questions prior to the visit, additional usage of staff PPE, and extensive cleaning of exam room while observing appropriate contact time as indicated for disinfecting solutions.     No follow-ups on file.  No orders of the defined types were placed in this encounter.    Requested Prescriptions    No prescriptions requested or ordered in this encounter

## 2019-04-24 MED FILL — ESCITALOPRAM 10 MG TABLET: 10 | 90 days supply | Qty: 90 | Fill #1

## 2019-04-25 MED FILL — VALACYCLOVIR HCL 500 MG TAB: 500 | 30 days supply | Qty: 30 | Fill #0

## 2019-05-03 MED FILL — MYORISAN 30 MG CAPSULE: 30 | 30 days supply | Qty: 30 | Fill #0

## 2019-05-08 MED FILL — MYORISAN 30 MG CAPSULE: 30 | 30 days supply | Qty: 30 | Fill #0

## 2019-06-09 MED FILL — ASHLYNA 0.15-0.03-0.01 MG T: 0.15-0.03 & | 91 days supply | Qty: 91 | Fill #2

## 2019-06-09 MED FILL — MYORISAN 30 MG CAPSULE: 30 | 30 days supply | Qty: 30 | Fill #0

## 2019-07-23 ENCOUNTER — Other Ambulatory Visit: Payer: Self-pay | Admitting: Family

## 2019-07-23 ENCOUNTER — Other Ambulatory Visit: Payer: Self-pay | Admitting: Internal Medicine

## 2019-07-23 MED FILL — ESCITALOPRAM 10 MG TABLET: 10 | 90 days supply | Qty: 90 | Fill #0

## 2019-08-06 MED FILL — VALACYCLOVIR HCL 500 MG TAB: 500 | 30 days supply | Qty: 30 | Fill #2

## 2019-08-22 ENCOUNTER — Ambulatory Visit: Payer: No Typology Code available for payment source | Admitting: Family

## 2019-08-22 DIAGNOSIS — Z0289 Encounter for other administrative examinations: Secondary | ICD-10-CM

## 2019-08-27 MED FILL — TRETINOIN 0.025% CREAM: 0.025 | 30 days supply | Qty: 45 | Fill #0

## 2019-08-29 MED FILL — BETAMETHASONE DP 0.05% CRM: 0.05 | 25 days supply | Qty: 45 | Fill #0

## 2019-08-29 MED FILL — ASHLYNA 0.15-0.03-0.01 MG T: 0.15-0.03 & | 91 days supply | Qty: 91 | Fill #3

## 2019-08-29 MED FILL — predniSONE 20 MG TABS: 20 | 5 days supply | Qty: 10 | Fill #0

## 2019-09-04 MED FILL — VALACYCLOVIR HCL 500 MG TAB: 500 | 30 days supply | Qty: 30 | Fill #3

## 2019-11-10 ENCOUNTER — Other Ambulatory Visit: Payer: Self-pay | Admitting: Family

## 2019-11-10 MED FILL — ESCITALOPRAM 10 MG TABLET: 10 | 90 days supply | Qty: 90 | Fill #0

## 2019-11-15 MED FILL — VALACYCLOVIR HCL 500 MG TAB: 500 | 30 days supply | Qty: 30 | Fill #4

## 2019-11-15 MED FILL — CYANOCOBALAMIN 1,000 MCG/ML: 1000 | 84 days supply | Qty: 12 | Fill #1

## 2019-11-19 MED FILL — VALACYCLOVIR HCL 500 MG TAB: 500 | 30 days supply | Qty: 30 | Fill #4

## 2019-12-01 MED FILL — ASHLYNA 0.15-0.03-0.01 MG T: 0.15-0.03 & | 91 days supply | Qty: 91 | Fill #4

## 2019-12-23 ENCOUNTER — Other Ambulatory Visit (HOSPITAL_COMMUNITY): Payer: Self-pay | Admitting: Obstetrics and Gynecology

## 2019-12-23 MED FILL — VALACYCLOVIR HCL 500 MG TAB: 500 | 15 days supply | Qty: 30 | Fill #0

## 2020-02-26 MED FILL — ASHLYNA 0.15-0.03-0.01 MG T: 0.15-0.03 & | 91 days supply | Qty: 91 | Fill #0

## 2020-03-23 ENCOUNTER — Other Ambulatory Visit: Payer: Self-pay | Admitting: Family

## 2020-03-23 MED FILL — ESCITALOPRAM 10 MG TABLET: 10 | 30 days supply | Qty: 30 | Fill #0

## 2020-03-23 MED FILL — BENZONATATE 200 MG CAP: 200 | 10 days supply | Qty: 30 | Fill #0

## 2020-03-23 MED FILL — DOXYCYCLINE HYCLATE 100 MG: 100 | 7 days supply | Qty: 14 | Fill #0

## 2020-03-24 ENCOUNTER — Ambulatory Visit: Payer: 59 | Admitting: Family

## 2020-03-24 ENCOUNTER — Ambulatory Visit (INDEPENDENT_AMBULATORY_CARE_PROVIDER_SITE_OTHER): Payer: 59

## 2020-03-24 ENCOUNTER — Other Ambulatory Visit: Payer: Self-pay | Admitting: Family

## 2020-03-24 ENCOUNTER — Encounter: Payer: Self-pay | Admitting: Family

## 2020-03-24 ENCOUNTER — Other Ambulatory Visit: Payer: Self-pay

## 2020-03-24 VITALS — BP 120/86 | HR 94 | Temp 98.2°F | Ht 66.0 in | Wt 199.4 lb

## 2020-03-24 DIAGNOSIS — R053 Chronic cough: Secondary | ICD-10-CM

## 2020-03-24 DIAGNOSIS — F419 Anxiety disorder, unspecified: Secondary | ICD-10-CM | POA: Diagnosis not present

## 2020-03-24 DIAGNOSIS — F32A Depression, unspecified: Secondary | ICD-10-CM | POA: Diagnosis not present

## 2020-03-24 DIAGNOSIS — E538 Deficiency of other specified B group vitamins: Secondary | ICD-10-CM | POA: Diagnosis not present

## 2020-03-24 MED ORDER — ESCITALOPRAM OXALATE 10 MG PO TABS: 10.0000 mg | ORAL_TABLET | Freq: Every day | ORAL | 3 refills | Status: DC

## 2020-03-24 NOTE — Progress Notes (Signed)
Teresa Brennan is a 35 y.o. female with the following history as recorded in EpicCare:  Patient Active Problem List   Diagnosis Date Noted  . Elevated ferritin 02/04/2018  . History of anemia 05/15/2015  . Status post primary low transverse cesarean section--NRFHR 07/20/2013  . Uterine fibroid 07/17/2013  . 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 Cervical high risk HPV (human papillomavirus) test positive 07/17/2013  . Pernicious anemia 10/03/2011  . Vitamin B12 deficiency 03/10/2011  . Anemia 03/10/2011    Current Outpatient Medications  Medication Sig Dispense Refill  . cyanocobalamin (,VITAMIN B-12,) 1000 MCG/ML injection INJECT 1 ML INTO THE MUSCLE ONCE A WEEK 12 mL 3  . DAYSEE 0.15-0.03 &0.01 MG tablet   4  . DENTAGEL 1.1 % GEL dental gel     . Needles & Syringes MISC 1 Syringe by Does not apply route once a week. 30 each 3  . triamcinolone ointment (KENALOG) 0.1 %     . valACYclovir (VALTREX) 500 MG tablet Take 500 mg by mouth daily.    Marland Kitchen VITAMIN D PO Take 2,000 Int'l Units by mouth.    . escitalopram (LEXAPRO) 10 MG tablet Take 1 tablet (10 mg total) by mouth daily. 90 tablet 3   No current facility-administered medications for this visit.    Allergies: Patient has no known allergies.  Past Medical History:  Diagnosis Date  . Anemia   . Asthma   . Dyspareunia, female   . Fibroid   . Headache(784.0)   . History of fatigue 03/10/11  . Irregular menses   . Migraine   . Ovarian tumor   . Painful intercourse 11/15/10    Past Surgical History:  Procedure Laterality Date  . CESAREAN SECTION N/A 07/18/2013   Procedure: CESAREAN SECTION;  Surgeon: Ena Dawley, MD;  Location: [facility];  Service: Obstetrics;  Laterality: N/A;  . HYSTEROSCOPY      Family History  Adopted: Yes  Problem Relation Age of Onset  . Sickle cell trait Daughter     Social History   Tobacco Use  . Smoking status: Never Smoker  . Smokeless tobacco: Never  Used  Substance Use Topics  . Alcohol use: No    Alcohol/week: 0.0 standard drinks    Subjective:   Chronic cough x 7 weeks; seemed to start after COVID mid-January; Did virtual visit yesterday with provider- was prescribed Doxycycline for possible bronchitis; requesting and in agreement that CXR is warranted; notes that she feels "fine" but it is just a lingering cough; Not using OTC medication- just felt like offering limited relief;   Did have asthma as a child/ sports related asthma- has not had any issues since middle school;   LMP- denies any chance of being pregnant/ continual cycling  Is continuing to take weekly B12 shots; last saw hematology in 2020; did not understand she was due for follow up OV in 02/2018;     Objective:  Vitals:   03/24/20 1011  BP: 120/86  Pulse: 94  Temp: 98.2 F (36.8 C)  TempSrc: Oral  SpO2: 99%  Weight: 199 lb 6.4 oz (90.4 kg)  Height: 5\' 6"  (1.676 m)    General: Well developed, well nourished, in no acute distress  Skin : Warm and dry.  Head: Normocephalic and atraumatic  Eyes: Sclera and conjunctiva clear; pupils round and reactive to light; extraocular movements intact  Ears: External normal; canals clear; tympanic membranes normal  Oropharynx: Pink, supple. No suspicious lesions  Neck:  Supple without thyromegaly, adenopathy  Lungs: Respirations unlabored; clear to auscultation bilaterally without wheeze, rales, rhonchi  CVS exam: normal rate and regular rhythm.  Neurologic: Alert and oriented; speech intact; face symmetrical; moves all extremities well; CNII-XII intact without focal deficit   Assessment:  1. Chronic cough   2. B12 deficiency   3. Anxiety and depression     Plan:  1. Symptoms started after COVID- patient has RX for Doxycycline but has not started; will most likely have her take this and add short course of prednisone; update CXR today and D-dimer today; follow-up to be determined; 2. Check B12 level- patient is  encouraged to consider follow up with her hematologist if B12 level has not remained stable; 3. Stable; refill updated;  This visit occurred during the SARS-CoV-2 public health emergency.  Safety protocols were in place, including screening questions prior to the visit, additional usage of staff PPE, and extensive cleaning of exam room while observing appropriate contact time as indicated for disinfecting solutions.      No follow-ups on file.  Orders Placed This Encounter  Procedures  . DG Chest 2 View    Standing Status:   Future    Number of Occurrences:   1    Standing Expiration Date:   03/24/2021    Order Specific Question:   Reason for Exam (SYMPTOM  OR DIAGNOSIS REQUIRED)    Answer:   cough x 2 months    Order Specific Question:   Is patient pregnant?    Answer:   No    Order Specific Question:   Preferred imaging location?    Answer:   [facility]  . [facility], [facility]    Standing Status:   Future    Number of Occurrences:   1    Standing Expiration Date:   03/24/2021  . B12    Standing Status:   Future    Number of Occurrences:   1    Standing Expiration Date:   03/24/2021    Requested Prescriptions   Signed Prescriptions Disp Refills  . escitalopram (LEXAPRO) 10 MG tablet 90 tablet 3    Sig: Take 1 tablet (10 mg total) by mouth daily.

## 2020-03-25 ENCOUNTER — Other Ambulatory Visit: Payer: Self-pay | Admitting: Family

## 2020-03-25 LAB — D-DIMER, QUANTITATIVE: D-Dimer, Quant: 0.23 mcg/mL FEU (ref <0.50)

## 2020-03-25 MED ORDER — PREDNISONE 20 MG PO TABS: 20.0000 mg | ORAL_TABLET | Freq: Every day | ORAL | 0 refills | Status: DC

## 2020-03-25 MED FILL — predniSONE 20 MG TABS: 20 | 5 days supply | Qty: 5 | Fill #0

## 2020-04-23 ENCOUNTER — Telehealth (INDEPENDENT_AMBULATORY_CARE_PROVIDER_SITE_OTHER): Payer: 59 | Admitting: Family

## 2020-04-23 ENCOUNTER — Other Ambulatory Visit: Payer: Self-pay | Admitting: Family

## 2020-04-23 DIAGNOSIS — U099 Post covid-19 condition, unspecified: Secondary | ICD-10-CM | POA: Diagnosis not present

## 2020-04-23 DIAGNOSIS — R053 Chronic cough: Secondary | ICD-10-CM

## 2020-04-23 MED ORDER — MONTELUKAST SODIUM 10 MG PO TABS: 10.0000 mg | ORAL_TABLET | Freq: Every day | ORAL | 0 refills | Status: DC

## 2020-04-23 MED ORDER — CETIRIZINE HCL 10 MG PO TABS: 10.0000 mg | ORAL_TABLET | Freq: Every day | ORAL | 0 refills | Status: DC

## 2020-04-23 NOTE — Progress Notes (Signed)
Teresa Brennan is a 35 y.o. female with the following history as recorded in EpicCare:  Patient Active Problem List   Diagnosis Date Noted  . Elevated ferritin 02/04/2018  . History of anemia 05/15/2015  . Status post primary low transverse cesarean section--NRFHR 07/20/2013  . Uterine fibroid 07/17/2013  . 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 Cervical high risk HPV (human papillomavirus) test positive 07/17/2013  . Pernicious anemia 10/03/2011  . Vitamin B12 deficiency 03/10/2011  . Anemia 03/10/2011    Current Outpatient Medications  Medication Sig Dispense Refill  . cetirizine (ZYRTEC) 10 MG tablet Take 1 tablet (10 mg total) by mouth at bedtime. 30 tablet 0  . montelukast (SINGULAIR) 10 MG tablet Take 1 tablet (10 mg total) by mouth at bedtime. 30 tablet 0  . cyanocobalamin (,VITAMIN B-12,) 1000 MCG/ML injection INJECT 1 ML INTO THE MUSCLE ONCE A WEEK 12 mL 3  . DAYSEE 0.15-0.03 &0.01 MG tablet   4  . DENTAGEL 1.1 % GEL dental gel     . escitalopram (LEXAPRO) 10 MG tablet Take 1 tablet (10 mg total) by mouth daily. 90 tablet 3  . Needles & Syringes MISC 1 Syringe by Does not apply route once a week. 30 each 3  . triamcinolone ointment (KENALOG) 0.1 %     . valACYclovir (VALTREX) 500 MG tablet Take 500 mg by mouth daily.    Marland Kitchen VITAMIN D PO Take 2,000 Int'l Units by mouth.     No current facility-administered medications for this visit.    Allergies: Patient has no known allergies.  Past Medical History:  Diagnosis Date  . Anemia   . Asthma   . Dyspareunia, female   . Fibroid   . Headache(784.0)   . History of fatigue 03/10/11  . Irregular menses   . Migraine   . Ovarian tumor   . Painful intercourse 11/15/10    Past Surgical History:  Procedure Laterality Date  . CESAREAN SECTION N/A 07/18/2013   Procedure: CESAREAN SECTION;  Surgeon: Ena Dawley, MD;  Location: [facility];  Service: Obstetrics;  Laterality: N/A;  . HYSTEROSCOPY       Family History  Adopted: Yes  Problem Relation Age of Onset  . Sickle cell trait Daughter     Social History   Tobacco Use  . Smoking status: Never Smoker  . Smokeless tobacco: Never Used  Substance Use Topics  . Alcohol use: No    Alcohol/week: 0.0 standard drinks    Subjective:   I connected with Teresa Brennan on 04/23/20 at 11:20 AM EDT by a telephone call  and verified that I am speaking with the correct person using two identifiers. Patient was unable to get video or microphone to work on her computer today.    I discussed the limitations of evaluation and management by telemedicine and the availability of in person appointments. The patient expressed understanding and agreed to proceed. Provider in office/ patient is at home; provider and patient are only 2 people on telephone call.   Last seen in the office in early March with concerns for chronic cough that had started in mid-January; cough seemed to start after she was diagnosed with COVID; she had a normal CXR, normal D-dimer and was treated with combination of Doxycycline and Prednisone; per patient, cough may be slightly improved but still present; does have history of childhood asthma but notes she never really has a cough; is not taking any medications at this time for  regular treatment; has used [employer] with limited benefit; no chest pain or shortness of breath;    Objective:  There were no vitals filed for this visit.  Lungs: Respirations unlabored;  Neurologic: Alert and oriented; speech intact; face symmetrical;   Assessment:  1. Chronic cough   2. Post-COVID chronic cough     Plan:  Exam and testing done here in March were normal with same symptoms; will give trial of Singulair and Zyrtec; will refer to pulmonology for further testing/ treatment options.  Time spent 11 minutes   No follow-ups on file.  Orders Placed This Encounter  Procedures  . Ambulatory referral to Pulmonology    Referral  Priority:   Routine    Referral Type:   Consultation    Referral Reason:   Specialty Services Required    Requested Specialty:   Pulmonary Disease    Number of Visits Requested:   1    Requested Prescriptions   Signed Prescriptions Disp Refills  . cetirizine (ZYRTEC) 10 MG tablet 30 tablet 0    Sig: Take 1 tablet (10 mg total) by mouth at bedtime.  . montelukast (SINGULAIR) 10 MG tablet 30 tablet 0    Sig: Take 1 tablet (10 mg total) by mouth at bedtime.

## 2020-04-24 ENCOUNTER — Other Ambulatory Visit (HOSPITAL_COMMUNITY): Payer: Self-pay

## 2020-04-24 MED FILL — Montelukast Sodium Tab 10 MG (Base Equiv): ORAL | 30 days supply | Qty: 30 | Fill #0 | Status: AC

## 2020-04-26 ENCOUNTER — Other Ambulatory Visit (HOSPITAL_COMMUNITY): Payer: Self-pay

## 2020-04-26 MED ORDER — VALACYCLOVIR HCL 500 MG PO TABS
500.0000 mg | ORAL_TABLET | Freq: Two times a day (BID) | ORAL | 1 refills | Status: AC | PRN
  Filled 2020-04-26: qty 30, 15d supply, fill #0
  Filled 2020-10-08: qty 30, 15d supply, fill #1

## 2020-04-26 MED FILL — Cetirizine HCl Tab 10 MG: ORAL | 30 days supply | Qty: 30 | Fill #0 | Status: AC

## 2020-04-27 ENCOUNTER — Other Ambulatory Visit: Payer: Self-pay | Admitting: *Deleted

## 2020-04-27 ENCOUNTER — Other Ambulatory Visit (HOSPITAL_COMMUNITY): Payer: Self-pay

## 2020-04-27 DIAGNOSIS — E538 Deficiency of other specified B group vitamins: Secondary | ICD-10-CM

## 2020-04-27 MED ORDER — CYANOCOBALAMIN 1000 MCG/ML IJ SOLN
INTRAMUSCULAR | 0 refills | Status: DC
  Filled 2020-04-27: qty 12, 84d supply, fill #0

## 2020-04-28 ENCOUNTER — Telehealth: Payer: Self-pay | Admitting: Hematology and Oncology

## 2020-04-28 NOTE — Telephone Encounter (Signed)
Scheduled appts per 4/4 sch msg. Called pt, no answer. Left msg with appt date and times.

## 2020-05-06 ENCOUNTER — Other Ambulatory Visit (HOSPITAL_COMMUNITY): Payer: Self-pay

## 2020-05-20 ENCOUNTER — Telehealth: Payer: Self-pay | Admitting: Hematology and Oncology

## 2020-05-20 NOTE — Telephone Encounter (Signed)
Cancelled appts per 4/28 sch msg. Pt declined to r/s at this time. Said she would call back.

## 2020-05-22 ENCOUNTER — Other Ambulatory Visit (HOSPITAL_COMMUNITY): Payer: Self-pay

## 2020-05-22 MED FILL — Levonorg-Eth Est Tab 0.15-0.03MG(84) & Eth Est Tab 0.01MG(7): ORAL | 91 days supply | Qty: 91 | Fill #0 | Status: CN

## 2020-05-24 ENCOUNTER — Other Ambulatory Visit: Payer: 59

## 2020-05-24 ENCOUNTER — Other Ambulatory Visit (HOSPITAL_COMMUNITY): Payer: Self-pay

## 2020-05-26 ENCOUNTER — Ambulatory Visit: Payer: 59 | Admitting: Hematology and Oncology

## 2020-05-31 ENCOUNTER — Encounter: Payer: Self-pay | Admitting: Family

## 2020-05-31 MED ORDER — MONTELUKAST SODIUM 10 MG PO TABS: 10.0000 mg | ORAL_TABLET | Freq: Every day | ORAL | 3 refills | Status: DC

## 2020-05-31 MED ORDER — CETIRIZINE HCL 10 MG PO TABS: 10.0000 mg | ORAL_TABLET | Freq: Every day | ORAL | 3 refills | Status: AC

## 2020-06-07 ENCOUNTER — Other Ambulatory Visit (HOSPITAL_COMMUNITY): Payer: Self-pay

## 2020-08-14 ENCOUNTER — Encounter: Payer: Self-pay | Admitting: Emergency Medicine

## 2020-08-14 ENCOUNTER — Ambulatory Visit
Admission: EM | Admit: 2020-08-14 | Discharge: 2020-08-14 | Disposition: A | Discharge status: Home / Self Care | Payer: Self-pay | Attending: Physician Assistant | Admitting: Physician Assistant

## 2020-08-14 ENCOUNTER — Other Ambulatory Visit: Payer: Self-pay

## 2020-08-14 DIAGNOSIS — H1031 Unspecified acute conjunctivitis, right eye: Secondary | ICD-10-CM

## 2020-08-14 MED ORDER — TOBRAMYCIN 0.3 % OP SOLN: 1.0000 [drp] | OPHTHALMIC | 0 refills | Status: AC

## 2020-08-14 NOTE — ED Provider Notes (Signed)
[facility]    CSN: [id number] Arrival date & time: 08/14/20  1025      History   Chief Complaint Chief Complaint  Patient presents with   Eye Pain    HPI Teresa Brennan is a 35 y.o. female.   The history is provided by the patient. No language interpreter was used.  Eye Pain This is a new problem. The current episode started more than 2 days ago. The problem occurs constantly. The problem has not changed since onset.Pertinent negatives include no chest pain. Nothing aggravates the symptoms. She has tried nothing for the symptoms. The treatment provided no relief.  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 07/17/2013   Cervical high risk HPV (human papillomavirus) test positive 07/17/2013   Pernicious anemia 10/03/2011   Vitamin B12 deficiency 03/10/2011   Anemia 03/10/2011    Past Surgical History:  Procedure Laterality Date   CESAREAN SECTION N/A 07/18/2013   Procedure: CESAREAN SECTION;  Surgeon: Ena Dawley, MD;  Location: [facility];  Service: Obstetrics;  Laterality: N/A;   HYSTEROSCOPY      OB History     Gravida  1   Para  1   Term  1   Preterm      AB      Living  1      SAB      IAB      Ectopic      Multiple      Live Births  1            Home Medications    Prior to Admission medications   Medication Sig Start Date End Date Taking? Authorizing Provider  tobramycin (TOBREX)  0.3 % ophthalmic solution Place 1 drop into the right eye every 4 (four) hours for 10 days. 08/14/20 08/24/20 Yes Caryl Ada K, PA-C  benzonatate (TESSALON) 200 MG capsule TAKE 1 CAPSULE BY MOUTH 3 TIMES DAILY AS NEEDED FOR COUGH Patient not taking: Reported on 08/14/2020 03/23/20 03/23/21    cetirizine (ZYRTEC) 10 MG tablet Take 1 tablet (10 mg total) by mouth at bedtime. 05/31/20   Marrian Salvage, FNP  cyanocobalamin (,VITAMIN B-12,) 1000 MCG/ML injection INJECT 1 ML INTO THE MUSCLE ONCE A WEEK 04/27/20   Nicholas Lose, MD  DAYSEE 0.15-0.03 &0.01 MG tablet  10/04/17   [provider]  DENTAGEL 1.1 % GEL dental gel  03/10/19   [provider]  doxycycline (VIBRAMYCIN) 100 MG capsule TAKE 1 CAPSULE BY MOUTH 2 TIMES DAILY Patient not taking: Reported on 08/14/2020 03/23/20 03/23/21    escitalopram (LEXAPRO) 10 MG tablet TAKE 1 TABLET BY MOUTH DAILY 03/24/20 03/24/21  Marrian Salvage, FNP  Levonorgestrel-Ethinyl Estradiol (AMETHIA) 0.15-0.03 &0.01 MG tablet TAKE 1 TABLET BY MOUTH ONCE DAILY 12/23/19 12/22/20  Everett Graff, MD  montelukast (SINGULAIR) 10 MG tablet Take 1 tablet (10 mg total) by  mouth at bedtime. 05/31/20   Marrian Salvage, FNP  Needles & Syringes MISC 1 Syringe by Does not apply route once a week. 02/04/18   Nicholas Lose, MD  triamcinolone ointment (KENALOG) 0.1 %  12/17/18   [provider]  valACYclovir (VALTREX) 500 MG tablet Take 500 mg by mouth daily. 03/24/19   [provider]  valACYclovir (VALTREX) 500 MG tablet Take 1 tablet (500 mg total) by mouth 2 (two) times daily as needed for 3 days 04/26/20   Everett Graff, MD  VITAMIN D PO Take 2,000 Int'l Units by mouth.    [provider]    Family History Family History  Adopted: Yes  Problem Relation Age of Onset   Sickle cell trait Daughter     Social History Social History   Tobacco Use   Smoking status: Never   Smokeless tobacco: Never  Substance Use Topics   Alcohol  use: No    Alcohol/week: 0.0 standard drinks   Drug use: No     Allergies   Patient has no known allergies.   Review of Systems Review of Systems  Eyes:  Positive for pain.  Cardiovascular:  Negative for chest pain.  All other systems reviewed and are negative.   Physical Exam Triage Vital Signs ED Triage Vitals [08/14/20 1315]  Enc Vitals Group     BP 132/86     Pulse Rate 92     Resp 18     Temp 98 F (36.7 C)     Temp Source Oral     SpO2 100 %     Weight      Height      Head Circumference      Peak Flow      Pain Score 5     Pain Loc      Pain Edu?      Excl. in [place]?    No data found.  Updated Vital Signs BP 132/86 (BP Location: Left Arm)   Pulse 92   Temp 98 F (36.7 C) (Oral)   Resp 18   SpO2 100%   Visual Acuity Right Eye Distance:   Left Eye Distance:   Bilateral Distance:    Right Eye Near:   Left Eye Near:    Bilateral Near:     Physical Exam Vitals reviewed.  HENT:     Head: Normocephalic.  Eyes:     Extraocular Movements: Extraocular movements intact.     Pupils: Pupils are equal, round, and reactive to light.     Comments: Slight injection, no lesions.    Cardiovascular:     Rate and Rhythm: Normal rate.  Pulmonary:     Effort: Pulmonary effort is normal.  Skin:    General: Skin is warm.  Neurological:     General: No focal deficit present.     Mental Status: She is alert.  Psychiatric:        Mood and Affect: Mood normal.     UC Treatments / Results  Labs (all labs ordered are listed, but only abnormal results are displayed) Labs Reviewed - No data to display  EKG   Radiology No results found.  Procedures Procedures (including critical care time)  Medications Ordered in UC Medications - No data to display  Initial Impression / Assessment and Plan / UC Course  I have reviewed the triage vital signs and the nursing notes.  Pertinent labs & imaging results that were available during my care of the  patient  were reviewed by me and considered in my medical decision making (see chart for details).     MDM:  Pt given rx for eye drops.  Pt advised to follow up with eye doctor if symptoms persist  Final Clinical Impressions(s) / UC Diagnoses   Final diagnoses:  Acute conjunctivitis of right eye, unspecified acute conjunctivitis type     Discharge Instructions      Return if any problems.    ED Prescriptions     Medication Sig Dispense Auth. Provider   tobramycin (TOBREX) 0.3 % ophthalmic solution Place 1 drop into the right eye every 4 (four) hours for 10 days. 5 mL Fransico Meadow, [place]      PDMP not reviewed this encounter. An After Visit Summary was printed and given to the patient.    Fransico Meadow, [place] 08/14/20 1555

## 2020-08-14 NOTE — ED Triage Notes (Signed)
Pt sts right eye irritation x 3 days with increased watering; pt sts removed contact lens

## 2020-08-14 NOTE — Discharge Instructions (Addendum)
Return if any problems.

## 2020-10-08 ENCOUNTER — Encounter: Payer: Self-pay | Admitting: Internal Medicine

## 2020-10-08 ENCOUNTER — Telehealth: Payer: Self-pay | Admitting: Hematology and Oncology

## 2020-10-08 ENCOUNTER — Other Ambulatory Visit: Payer: Self-pay | Admitting: Hematology and Oncology

## 2020-10-08 ENCOUNTER — Other Ambulatory Visit (HOSPITAL_COMMUNITY): Payer: Self-pay

## 2020-10-08 MED ORDER — CYANOCOBALAMIN 1000 MCG/ML IJ SOLN
INTRAMUSCULAR | 0 refills | Status: DC
  Filled 2020-10-08: qty 4, 28d supply, fill #0

## 2020-10-08 NOTE — Telephone Encounter (Signed)
Scheduled appt per 9/16 sch msg. Pt is aware.  

## 2020-10-11 ENCOUNTER — Other Ambulatory Visit (HOSPITAL_COMMUNITY): Payer: Self-pay

## 2020-10-11 ENCOUNTER — Encounter: Payer: Self-pay | Admitting: Internal Medicine

## 2020-10-11 MED ORDER — TRETINOIN 0.025 % EX CREA
TOPICAL_CREAM | CUTANEOUS | 3 refills | Status: DC
  Filled 2020-10-11: qty 45, 30d supply, fill #0

## 2020-10-12 ENCOUNTER — Other Ambulatory Visit (HOSPITAL_COMMUNITY): Payer: Self-pay

## 2020-10-18 ENCOUNTER — Encounter: Payer: Self-pay | Admitting: Internal Medicine

## 2020-10-20 ENCOUNTER — Other Ambulatory Visit (HOSPITAL_COMMUNITY): Payer: Self-pay

## 2020-10-23 NOTE — Progress Notes (Signed)
Patient Care Team: Marrian Salvage, FNP as PCP - General (Internal Medicine) Ena Dawley, MD as Consulting Physician (Obstetrics and Gynecology) Bernadene Bell, MD (Inactive) as Consulting Physician (Hematology)  DIAGNOSIS:    ICD-10-CM   1. Vitamin B12 deficiency  E53.8     2. Elevated ferritin  R79.89       CHIEF COMPLIANT: Follow-up of pernicious anemia  INTERVAL HISTORY: Teresa Brennan is a 35 y.o. with above-mentioned history of pernicious anemia. She presents to the clinic today for follow-up.  She is doing quite well with exception of occasional tingling and numbness of her feet.  She continues to do monthly B12 injections without any problems or concerns.  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  She also works part-time at the [employer] on the sixth floor.  She generally feels well without any shortness of breath or lightheadedness or dizziness.  ALLERGIES:  has No Known Allergies.  MEDICATIONS:  Current Outpatient Medications  Medication Sig Dispense Refill   cetirizine (ZYRTEC) 10 MG tablet Take 1 tablet (10 mg total) by mouth at bedtime. 90 tablet 3   cyanocobalamin (,VITAMIN B-12,) 1000 MCG/ML injection INJECT 1 ML INTO THE MUSCLE ONCE A WEEK 12 mL 1   DAYSEE 0.15-0.03 &0.01 MG tablet   4   DENTAGEL 1.1 % GEL dental gel      escitalopram (LEXAPRO) 10 MG tablet TAKE 1 TABLET BY MOUTH DAILY 90 tablet 3   montelukast (SINGULAIR) 10 MG tablet Take 1 tablet (10 mg total) by mouth at bedtime. 90 tablet 3   Needles & Syringes MISC 1 Syringe by Does not apply route once a week. 30 each 3   tretinoin (RETIN-A) 0.025 % cream Apply a small amount to skin every night.  Start 2-3x per week, increase use as tolerated. 45 g 3   valACYclovir (VALTREX) 500 MG tablet Take 1 tablet (500 mg total) by mouth 2 (two) times daily as needed for 3 days 30 tablet 1   VITAMIN D PO Take 2,000 Int'l Units by mouth.     No  current facility-administered medications for this visit.    PHYSICAL EXAMINATION: ECOG PERFORMANCE STATUS: 1 - Symptomatic but completely ambulatory  Vitals:   10/25/20 0845  BP: 119/75  Pulse: 82  Resp: 18  Temp: (!) 97.5 F (36.4 C)  SpO2: 100%   Filed Weights   10/25/20 0845  Weight: 207 lb 14.4 oz (94.3 kg)    LABORATORY DATA:  I have reviewed the data as listed CMP Latest Ref Rng & Units 06/12/2018 12/24/2017 05/24/2015  Glucose 70 - 99 mg/dL 73 90 75  BUN 6 - 23 mg/dL 10 11 9   Creatinine 0.40 - 1.20 mg/dL 0.94 0.98 0.93  Sodium 135 - 145 mEq/L 138 138 141  Potassium 3.5 - 5.1 mEq/L 3.9 3.9 3.8  Chloride 96 - 112 mEq/L 105 105 102  CO2 19 - 32 mEq/L 25 25 26   Calcium 8.4 - 10.5 mg/dL 8.9 8.9 9.6  Total Protein 6.0 - 8.3 g/dL 7.2 6.9 7.0  Total Bilirubin 0.2 - 1.2 mg/dL 0.3 0.6 0.4  Alkaline Phos 39 - 117 U/L 59 49 68  AST 0 - 37 U/L 14 15 14   ALT 0 - 35 U/L 14 16 18     Lab Results  Component Value Date   WBC 5.3 10/25/2020   HGB 14.2 10/25/2020   HCT 42.1 10/25/2020   MCV 82.2 10/25/2020  PLT 284 10/25/2020   NEUTROABS 3.1 10/25/2020    ASSESSMENT & PLAN:  Elevated ferritin Lab review 12/24/2017: Hemoglobin 13.9, MCV 107.4, vitamin B12 39 Ferritin 247, iron saturation 50%   Causes of elevated ferritin: Inflammation versus iron overload The lab test did not make sense. Repeat Testing: Normal hemochromatosis gene testing: Neg      Severe vitamin B12 deficiency: Previously diagnosed as pernicious anemia. She wanted to do B12 injections at home. I sent her prescription for B12 shots along with syringes   Return to clinic in 1 year for labs followed by MyChart virtual visit.  No orders of the defined types were placed in this encounter.  The patient has a good understanding of the overall plan. she agrees with it. she will call with any problems that may develop before the next visit here.  Total time spent: 20 mins including face to face time and  time spent for planning, charting and coordination of care  Rulon Eisenmenger, MD, MPH 10/25/2020  I, Thana Ates, am acting as scribe for Dr. Nicholas Lose.  I have reviewed the above documentation for accuracy and completeness, and I agree with the above.

## 2020-10-25 ENCOUNTER — Inpatient Hospital Stay: Payer: 59 | Attending: Hematology and Oncology

## 2020-10-25 ENCOUNTER — Other Ambulatory Visit: Payer: Self-pay

## 2020-10-25 ENCOUNTER — Inpatient Hospital Stay (HOSPITAL_BASED_OUTPATIENT_CLINIC_OR_DEPARTMENT_OTHER): Payer: 59 | Admitting: Hematology and Oncology

## 2020-10-25 VITALS — BP 119/75 | HR 82 | Temp 97.5°F | Resp 18 | Ht 66.0 in | Wt 207.9 lb

## 2020-10-25 DIAGNOSIS — E538 Deficiency of other specified B group vitamins: Secondary | ICD-10-CM

## 2020-10-25 DIAGNOSIS — R7989 Other specified abnormal findings of blood chemistry: Secondary | ICD-10-CM | POA: Diagnosis not present

## 2020-10-25 DIAGNOSIS — D510 Vitamin B12 deficiency anemia due to intrinsic factor deficiency: Secondary | ICD-10-CM | POA: Diagnosis not present

## 2020-10-25 LAB — CBC WITH DIFFERENTIAL (CANCER CENTER ONLY)
Abs Immature Granulocytes: 0.02 10*3/uL (ref 0.00–0.07)
Basophils Absolute: 0.1 10*3/uL (ref 0.0–0.1)
Basophils Relative: 1 %
Eosinophils Absolute: 0.1 10*3/uL (ref 0.0–0.5)
Eosinophils Relative: 2 %
HCT: 42.1 % (ref 36.0–46.0)
Hemoglobin: 14.2 g/dL (ref 12.0–15.0)
Immature Granulocytes: 0 %
Lymphocytes Relative: 30 %
Lymphs Abs: 1.6 10*3/uL (ref 0.7–4.0)
MCH: 27.7 pg (ref 26.0–34.0)
MCHC: 33.7 g/dL (ref 30.0–36.0)
MCV: 82.2 fL (ref 80.0–100.0)
Monocytes Absolute: 0.5 10*3/uL (ref 0.1–1.0)
Monocytes Relative: 9 %
Neutro Abs: 3.1 10*3/uL (ref 1.7–7.7)
Neutrophils Relative %: 58 %
Platelet Count: 284 10*3/uL (ref 150–400)
RBC: 5.12 MIL/uL — ABNORMAL HIGH (ref 3.87–5.11)
RDW: 12.1 % (ref 11.5–15.5)
WBC Count: 5.3 10*3/uL (ref 4.0–10.5)
nRBC: 0 % (ref 0.0–0.2)

## 2020-10-25 LAB — IRON AND TIBC
Iron: 83 ug/dL (ref 41–142)
Saturation Ratios: 21 % (ref 21–57)
TIBC: 401 ug/dL (ref 236–444)
UIBC: 318 ug/dL (ref 120–384)

## 2020-10-25 LAB — VITAMIN B12: Vitamin B-12: 344 pg/mL (ref 180–914)

## 2020-10-25 LAB — FERRITIN: Ferritin: 37 ng/mL (ref 11–307)

## 2020-10-25 MED ORDER — CYANOCOBALAMIN 1000 MCG/ML IJ SOLN: INTRAMUSCULAR | 1 refills | Status: DC

## 2020-10-25 NOTE — Assessment & Plan Note (Signed)
Lab review 12/24/2017: Hemoglobin 13.9, MCV 107.4, vitamin B12 39 Ferritin 247, iron saturation 50%  Causes of elevated ferritin: Inflammation versus iron overload The lab test did not make sense. Repeat Testing: Normal hemochromatosis gene testing: Neg    Severe vitamin B12 deficiency: Previously diagnosed as pernicious anemia. Teresa Brennan wanted to do B12 injections at home. I sent her prescription for B12 shots along with syringes

## 2020-10-29 ENCOUNTER — Institutional Professional Consult (permissible substitution): Payer: 59 | Admitting: Internal Medicine

## 2020-11-09 NOTE — Progress Notes (Signed)
Synopsis: Referred for chronic cough by Marrian Salvage,*  Subjective:   PATIENT ID: Teresa Brennan GENDER: female DOB: Dec 19, 1985, MRN: [id number]  Chief Complaint  Patient presents with   Consult    Pt. Says she's having a lot of coughing spells.    35yF with pernicious anemia, asthma when she was a kid, covid-19 infection in 01/2020 who is referred for chronic cough.  She had been vaccinated. Got covid 02/2020, never saw physician for it, never was hospitalized. Can't shake cough though. She tried singulair and has been taking it ever since. Cough is worse when off of singulair. She also takes zyrtec for allergy but doesn't think it's helpful. She doesn't have sinus congestion. Does have PND. Does not like using flonase. No clearly symptomatic reflux. It is a dry cough. No accompanying throat tightness or voice change with coughing spells.   She has mild DOE which she attributes to being overweight. It is probably worse since covid. Hasn't been working out as much over this time frame.  Otherwise pertinent review of systems is negative.  No family history of lung disease but she was adopted.  She has previously lived in [place], [place]. 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 She has no pets. No water damage or hot tub at home.     Past Medical History:  Diagnosis Date   Anemia    Asthma    Dyspareunia, female    Fibroid    Headache(784.0)    History of fatigue 03/10/11   Irregular menses    Migraine    Ovarian tumor    Painful intercourse 11/15/10     Family History  Adopted: Yes  Problem Relation Age of Onset   Sickle cell trait Daughter      Past Surgical History:  Procedure Laterality Date   CESAREAN SECTION N/A 07/18/2013   Procedure: CESAREAN SECTION;  Surgeon: Ena Dawley, MD;  Location: [facility];  Service: Obstetrics;  Laterality: N/A;   HYSTEROSCOPY      Social History   Socioeconomic  History   Marital status: Single    Spouse name: n/a   Number of children: 1   Years of education: 12+   Highest education level: Not on file  Occupational History   Occupation: billing specialist    Comment: [employer]  Tobacco Use   Smoking status: Never   Smokeless tobacco: Never  Substance and Sexual Activity   Alcohol use: No    Alcohol/week: 0.0 standard drinks   Drug use: No   Sexual activity: Yes    Partners: Male    Birth control/protection: Pill  Other Topics Concern   Not on file  Social History Narrative   Working on an associates degree in Nature conservation officer.   Adopted, along with her fraternal twin sister, at 87 days old.   She is in contact with other biological siblings.   Lives alone, with her daughter, born in 06/2013.   Her adoptive parents are both deceased.   Her sister lives nearby.   Social Determinants of Health   Financial Resource Strain: Not on file  Food Insecurity: Not on file  Transportation Needs: Not on file  Physical Activity: Not on file  Stress: Not on file  Social Connections: Not on file  Intimate Partner Violence: Not on file     No Known Allergies   Outpatient Medications Prior to Visit  Medication Sig Dispense Refill   cetirizine (ZYRTEC)  10 MG tablet Take 1 tablet (10 mg total) by mouth at bedtime. 90 tablet 3   cyanocobalamin (,VITAMIN B-12,) 1000 MCG/ML injection INJECT 1 ML INTO THE MUSCLE ONCE A WEEK 12 mL 1   DAYSEE 0.15-0.03 &0.01 MG tablet   4   DENTAGEL 1.1 % GEL dental gel      escitalopram (LEXAPRO) 10 MG tablet TAKE 1 TABLET BY MOUTH DAILY 90 tablet 3   montelukast (SINGULAIR) 10 MG tablet Take 1 tablet (10 mg total) by mouth at bedtime. 90 tablet 3   Needles & Syringes MISC 1 Syringe by Does not apply route once a week. 30 each 3   tretinoin (RETIN-A) 0.025 % cream Apply a small amount to skin every night.  Start 2-3x per week, increase use as tolerated. 45 g 3   valACYclovir (VALTREX) 500 MG tablet Take 1 tablet (500 mg  total) by mouth 2 (two) times daily as needed for 3 days 30 tablet 1   VITAMIN D PO Take 2,000 Int'l Units by mouth.     No facility-administered medications prior to visit.       Objective:   Physical Exam:  General appearance: 35 y.o., female, NAD, conversant  Eyes: anicteric sclerae, moist conjunctivae; no lid-lag; PERRL, tracking appropriately HENT: NCAT; oropharynx, MMM, no mucosal ulcerations; normal hard and soft palate Neck: Trachea midline; no lymphadenopathy, no JVD Lungs: CTAB, no crackles, no wheeze, with normal respiratory effort CV: RRR, no MRGs  Abdomen: Soft, non-tender; non-distended, BS present  Extremities: No peripheral edema, radial and DP pulses present bilaterally  Skin: Normal temperature, turgor and texture; no rash Psych: Appropriate affect Neuro: Alert and oriented to person and place, no focal deficit    Vitals:   11/10/20 0906  BP: 112/70  Pulse: 94  Temp: 98.6 F (37 C)  TempSrc: Oral  SpO2: 99%  Weight: 210 lb 12.8 oz (95.6 kg)  Height: 5\' 6"  (1.676 m)   99% on RA BMI Readings from Last 3 Encounters:  11/10/20 34.02 kg/m  10/25/20 33.56 kg/m  03/24/20 32.18 kg/m   Wt Readings from Last 3 Encounters:  11/10/20 210 lb 12.8 oz (95.6 kg)  10/25/20 207 lb 14.4 oz (94.3 kg)  03/24/20 199 lb 6.4 oz (90.4 kg)     CBC    Component Value Date/Time   WBC 5.3 10/25/2020 0836   WBC 6.0 06/12/2018 1139   RBC 5.12 (H) 10/25/2020 0836   HGB 14.2 10/25/2020 0836   HGB 14.2 05/24/2015 1246   HGB 13.8 10/05/2014 1556   HCT 42.1 10/25/2020 0836   HCT 42.5 05/24/2015 1246   HCT 41.9 10/05/2014 1556   PLT 284 10/25/2020 0836   PLT 323 05/24/2015 1246   MCV 82.2 10/25/2020 0836   MCV 86 05/24/2015 1246   MCV 82.1 10/05/2014 1556   MCH 27.7 10/25/2020 0836   MCHC 33.7 10/25/2020 0836   RDW 12.1 10/25/2020 0836   RDW 12.9 05/24/2015 1246   RDW 13.3 10/05/2014 1556   LYMPHSABS 1.6 10/25/2020 0836   LYMPHSABS 2.3 05/24/2015 1246    LYMPHSABS 1.6 10/05/2014 1556   MONOABS 0.5 10/25/2020 0836   MONOABS 0.5 10/05/2014 1556   EOSABS 0.1 10/25/2020 0836   EOSABS 0.1 05/24/2015 1246   BASOSABS 0.1 10/25/2020 0836   BASOSABS 0.0 05/24/2015 1246   BASOSABS 0.0 10/05/2014 1556    Unremarkable eosinophilia historically  Chest Imaging: CXR 03/24/2020 reviewed by me and unremarkable  CTA Chest 04/2014 reviewed by me and unremarkable  Pulmonary Functions Testing Results: No flowsheet data found.     Assessment & Plan:   # Chronic cough: May be dealing with asthma or reactive airways post covid-19, alternatively consider rhinitis/postnasal drainage. GERD possible but not overtly symptomatic. Covid-ILD seems less likely given clear CXR earlier this year  Plan: - PFTs - hold montelukast beforehand - flonase 1 spray each nare after clearing nose of crusting following shower or neti pot irrigation - if does not clearly have features of asthma on PFT or methacholine challenge and not responsive to treatment of PND then consider trial of PPI.     Maryjane Hurter, MD [facility] 11/10/2020 9:14 AM

## 2020-11-10 ENCOUNTER — Encounter: Payer: Self-pay | Admitting: Student

## 2020-11-10 ENCOUNTER — Other Ambulatory Visit: Payer: Self-pay

## 2020-11-10 ENCOUNTER — Ambulatory Visit: Payer: 59 | Admitting: Student

## 2020-11-10 VITALS — BP 112/70 | HR 94 | Temp 98.6°F | Ht 66.0 in | Wt 210.8 lb

## 2020-11-10 DIAGNOSIS — R053 Chronic cough: Secondary | ICD-10-CM | POA: Diagnosis not present

## 2020-11-10 DIAGNOSIS — U071 COVID-19: Secondary | ICD-10-CM

## 2020-11-10 NOTE — Patient Instructions (Addendum)
-   You will be called to schedule breathing tests, I'll let you know if we should start an inhaler after reviewing them or if we need to consider alternative testing. Hold singulair for a week ideally beforehand. - flonase 1 spray each nare after clearing nose of crusting following shower or neti pot irrigation

## 2021-02-03 ENCOUNTER — Ambulatory Visit: Payer: 59 | Admitting: Student

## 2021-02-14 ENCOUNTER — Other Ambulatory Visit: Payer: Self-pay | Admitting: Obstetrics and Gynecology

## 2021-02-14 NOTE — Progress Notes (Signed)
Synopsis: Referred for chronic cough by Marrian Salvage,*  Subjective:   PATIENT ID: Teresa Brennan GENDER: female DOB: 1985/03/20, MRN: [id number]  Chief Complaint  Patient presents with   Follow-up    Overall doing well and she is not coughing much. She only gets winded with strenuous exercise.    36yF with pernicious anemia, asthma when she was a kid, covid-19 infection in 01/2020 who is referred for chronic cough.  She had been vaccinated. Got covid 02/2020, never saw physician for it, never was hospitalized. Can't shake cough though. She tried singulair and has been taking it ever since. Cough is worse when off of singulair. She also takes zyrtec for allergy but doesn't think it's helpful. She doesn't have sinus congestion. Does have PND. Does not like using flonase. No clearly symptomatic reflux. It is a dry cough. No accompanying throat tightness or voice change with coughing spells.   She has mild DOE which she attributes to being overweight. It is probably worse since covid. Hasn't been working out as much over this time frame  No family history of lung disease but she was adopted.  She has previously lived in [place], urban. 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 She has no pets. No water damage or hot tub at home.   Interval HPI Last seen in clinic 11/10/20 at which point recommended consistent use of flonase, PFTs. Flonase helped some with PND but she still has a bit of drainage (she isn't using it everyday). She is doing better from cough standpoint. She does have DOE however. Wonders if there's some deconditioning. It is stable since last visit.   Otherwise pertinent review of systems is negative.   Past Medical History:  Diagnosis Date   Anemia    Asthma    Dyspareunia, female    Fibroid    Headache(784.0)    History of fatigue 03/10/11   Irregular menses    Migraine    Ovarian tumor    Painful  intercourse 11/15/10     Family History  Adopted: Yes  Problem Relation Age of Onset   Sickle cell trait Daughter      Past Surgical History:  Procedure Laterality Date   CESAREAN SECTION N/A 07/18/2013   Procedure: CESAREAN SECTION;  Surgeon: Ena Dawley, MD;  Location: [facility];  Service: Obstetrics;  Laterality: N/A;   HYSTEROSCOPY      Social History   Socioeconomic History   Marital status: Single    Spouse name: n/a   Number of children: 1   Years of education: 12+   Highest education level: Not on file  Occupational History   Occupation: billing specialist    Comment: [employer]  Tobacco Use   Smoking status: Never   Smokeless tobacco: Never  Substance and Sexual Activity   Alcohol use: No    Alcohol/week: 0.0 standard drinks   Drug use: No   Sexual activity: Yes    Partners: Male    Birth control/protection: Pill  Other Topics Concern   Not on file  Social History Narrative   Working on an associates degree in Nature conservation officer.   Adopted, along with her fraternal twin sister, at 71 days old.   She is in contact with other biological siblings.   Lives alone, with her daughter, born in 06/2013.   Her adoptive parents are both deceased.   Her sister lives nearby.   Social Determinants of Health  Financial Resource Strain: Not on file  Food Insecurity: Not on file  Transportation Needs: Not on file  Physical Activity: Not on file  Stress: Not on file  Social Connections: Not on file  Intimate Partner Violence: Not on file     No Known Allergies   Outpatient Medications Prior to Visit  Medication Sig Dispense Refill   cetirizine (ZYRTEC) 10 MG tablet Take 1 tablet (10 mg total) by mouth at bedtime. (Patient taking differently: Take 10 mg by mouth daily as needed.) 90 tablet 3   cyanocobalamin (,VITAMIN B-12,) 1000 MCG/ML injection INJECT 1 ML INTO THE MUSCLE ONCE A WEEK 12 mL 1   DAYSEE 0.15-0.03 &0.01 MG tablet   4   DENTAGEL 1.1 % GEL dental gel       escitalopram (LEXAPRO) 10 MG tablet TAKE 1 TABLET BY MOUTH DAILY 90 tablet 3   Needles & Syringes MISC 1 Syringe by Does not apply route once a week. 30 each 3   tretinoin (RETIN-A) 0.025 % cream Apply a small amount to skin every night.  Start 2-3x per week, increase use as tolerated. 45 g 3   valACYclovir (VALTREX) 500 MG tablet Take 1 tablet (500 mg total) by mouth 2 (two) times daily as needed for 3 days 30 tablet 1   VITAMIN D PO Take 2,000 Int'l Units by mouth.     montelukast (SINGULAIR) 10 MG tablet Take 1 tablet (10 mg total) by mouth at bedtime. (Patient not taking: Reported on 02/15/2021) 90 tablet 3   No facility-administered medications prior to visit.       Objective:   Physical Exam:  General appearance: 36 y.o., female, NAD, female, NAD, conversant  Eyes: anicteric sclerae; PERRL, tracking appropriately HENT: NCAT; MMM Neck: Trachea midline; no lymphadenopathy, no JVD Lungs: CTAB, no crackles, no wheeze, with normal respiratory effort CV: RRR, no murmur  Abdomen: Soft, non-tender; non-distended, BS present  Extremities: No peripheral edema, warm Skin: Normal turgor and texture; no rash Psych: Appropriate affect Neuro: Alert and oriented to person and place, no focal deficit    Vitals:   02/15/21 0926  BP: 124/78  Pulse: 91  Temp: 99 F (37.2 C)  TempSrc: Oral  SpO2: 98%  Weight: 211 lb 6.4 oz (95.9 kg)  Height: 5\' 8"  (1.727 m)    98% on RA BMI Readings from Last 3 Encounters:  02/15/21 32.14 kg/m  11/10/20 34.02 kg/m  10/25/20 33.56 kg/m   Wt Readings from Last 3 Encounters:  02/15/21 211 lb 6.4 oz (95.9 kg)  11/10/20 210 lb 12.8 oz (95.6 kg)  10/25/20 207 lb 14.4 oz (94.3 kg)     CBC    Component Value Date/Time   WBC 5.3 10/25/2020 0836   WBC 6.0 06/12/2018 1139   RBC 5.12 (H) 10/25/2020 0836   HGB 14.2 10/25/2020 0836   HGB 14.2 05/24/2015 1246   HGB 13.8 10/05/2014 1556   HCT 42.1 10/25/2020 0836   HCT 42.5 05/24/2015 1246   HCT 41.9  10/05/2014 1556   PLT 284 10/25/2020 0836   PLT 323 05/24/2015 1246   MCV 82.2 10/25/2020 0836   MCV 86 05/24/2015 1246   MCV 82.1 10/05/2014 1556   MCH 27.7 10/25/2020 0836   MCHC 33.7 10/25/2020 0836   RDW 12.1 10/25/2020 0836   RDW 12.9 05/24/2015 1246   RDW 13.3 10/05/2014 1556   LYMPHSABS 1.6 10/25/2020 0836   LYMPHSABS 2.3 05/24/2015 1246   LYMPHSABS 1.6 10/05/2014 1556   MONOABS 0.5 10/25/2020  0836   MONOABS 0.5 10/05/2014 1556   EOSABS 0.1 10/25/2020 0836   EOSABS 0.1 05/24/2015 1246   BASOSABS 0.1 10/25/2020 0836   BASOSABS 0.0 05/24/2015 1246   BASOSABS 0.0 10/05/2014 1556    Unremarkable eosinophilia historically  Chest Imaging: CXR 03/24/2020 reviewed by me and unremarkable  CTA Chest 04/2014 reviewed by me and unremarkable   Pulmonary Functions Testing Results: PFT reviewed by me today - normal     Assessment & Plan:   # DOE: May be dealing with asthma or reactive airways post covid-19 although pre/post BD spiro WNL or simply with deconditioning. Covid-ILD seems less likely given clear CXR earlier this year and DLCO wnl.  # Chronic cough: Much improved, essentially resolved.   Plan: - flonase 1 spray each nare after clearing nose of crusting following shower or neti pot irrigation - if DOE not improving with efforts at exercise to work on deconditioning she'll let us know if we should proceed with methacholine challenge     Maryjane Hurter, MD [facility] 02/15/2021 11:12 AM

## 2021-02-15 ENCOUNTER — Ambulatory Visit: Payer: 59 | Admitting: Student

## 2021-02-15 ENCOUNTER — Ambulatory Visit (INDEPENDENT_AMBULATORY_CARE_PROVIDER_SITE_OTHER): Payer: 59 | Admitting: Student

## 2021-02-15 ENCOUNTER — Encounter: Payer: Self-pay | Admitting: Student

## 2021-02-15 ENCOUNTER — Other Ambulatory Visit: Payer: Self-pay

## 2021-02-15 VITALS — BP 124/78 | HR 91 | Temp 99.0°F | Ht 68.0 in | Wt 211.4 lb

## 2021-02-15 DIAGNOSIS — R053 Chronic cough: Secondary | ICD-10-CM | POA: Diagnosis not present

## 2021-02-15 DIAGNOSIS — R0609 Other forms of dyspnea: Secondary | ICD-10-CM

## 2021-02-15 LAB — PULMONARY FUNCTION TEST
DL/VA % pred: 110 %
DL/VA: 4.93 ml/min/mmHg/L
DLCO cor % pred: 85 %
DLCO cor: 20.71 ml/min/mmHg
DLCO unc % pred: 85 %
DLCO unc: 20.71 ml/min/mmHg
FEF 25-75 Post: 2.6 L/sec
FEF 25-75 Pre: 2.22 L/sec
FEF2575-%Change-Post: 17 %
FEF2575-%Pred-Post: 80 %
FEF2575-%Pred-Pre: 68 %
FEV1-%Change-Post: 2 %
FEV1-%Pred-Post: 89 %
FEV1-%Pred-Pre: 87 %
FEV1-Post: 2.58 L
FEV1-Pre: 2.52 L
FEV1FVC-%Change-Post: 4 %
FEV1FVC-%Pred-Pre: 93 %
FEV6-%Change-Post: -1 %
FEV6-%Pred-Post: 92 %
FEV6-%Pred-Pre: 93 %
FEV6-Post: 3.13 L
FEV6-Pre: 3.19 L
FEV6FVC-%Change-Post: 0 %
FEV6FVC-%Pred-Post: 101 %
FEV6FVC-%Pred-Pre: 101 %
FVC-%Change-Post: -1 %
FVC-%Pred-Post: 90 %
FVC-%Pred-Pre: 92 %
FVC-Post: 3.14 L
FVC-Pre: 3.19 L
Post FEV1/FVC ratio: 82 %
Post FEV6/FVC ratio: 100 %
Pre FEV1/FVC ratio: 79 %
Pre FEV6/FVC Ratio: 100 %
RV % pred: 96 %
RV: 1.53 L
TLC % pred: 84 %
TLC: 4.58 L

## 2021-02-15 NOTE — Patient Instructions (Signed)
-   PFTs and we will discuss results as soon as performed. If not performed today then send a message via my chart or call and I will be happy to go over results before next clinic visit

## 2021-02-15 NOTE — Progress Notes (Signed)
PFT done today. 

## 2021-04-11 ENCOUNTER — Other Ambulatory Visit: Payer: Self-pay | Admitting: Family

## 2021-05-10 ENCOUNTER — Encounter: Payer: Self-pay | Admitting: Family

## 2021-05-10 ENCOUNTER — Telehealth (INDEPENDENT_AMBULATORY_CARE_PROVIDER_SITE_OTHER): Payer: 59 | Admitting: Family

## 2021-05-10 DIAGNOSIS — J019 Acute sinusitis, unspecified: Secondary | ICD-10-CM

## 2021-05-10 MED ORDER — AMOXICILLIN-POT CLAVULANATE 875-125 MG PO TABS: 1.0000 | ORAL_TABLET | Freq: Two times a day (BID) | ORAL | 0 refills | Status: AC

## 2021-05-10 NOTE — Progress Notes (Signed)
?Teresa Brennan is a 36 y.o. female with the following history as recorded in EpicCare:  ?Patient Active Problem List  ? Diagnosis Date Noted  ? Elevated ferritin 02/04/2018  ? History of anemia 05/15/2015  ? Status post primary low transverse cesarean section--NRFHR 07/20/2013  ? Uterine fibroid 07/17/2013  ? 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 Cervical high risk HPV (human papillomavirus) test positive 07/17/2013  ? Pernicious anemia 10/03/2011  ? Vitamin B12 deficiency 03/10/2011  ? Anemia 03/10/2011  ?  ?Current Outpatient Medications  ?Medication Sig Dispense Refill  ? amoxicillin-clavulanate (AUGMENTIN) 875-125 MG tablet Take 1 tablet by mouth 2 (two) times daily for 10 days. 20 tablet 0  ? cyanocobalamin (,VITAMIN B-12,) 1000 MCG/ML injection INJECT 1 ML INTO THE MUSCLE ONCE A WEEK 12 mL 1  ? DAYSEE 0.15-0.03 &0.01 MG tablet   4  ? DENTAGEL 1.1 % GEL dental gel     ? escitalopram (LEXAPRO) 10 MG tablet Take 1 tablet (10 mg total) by mouth daily. OV due for further refills 30 tablet 1  ? Needles & Syringes MISC 1 Syringe by Does not apply route once a week. 30 each 3  ? valACYclovir (VALTREX) 500 MG tablet Take 1 tablet (500 mg total) by mouth 2 (two) times daily as needed for 3 days 30 tablet 1  ? VITAMIN D PO Take 2,000 Int'l Units by mouth.    ? cetirizine (ZYRTEC) 10 MG tablet Take 1 tablet (10 mg total) by mouth at bedtime. (Patient not taking: Reported on 05/10/2021) 90 tablet 3  ? montelukast (SINGULAIR) 10 MG tablet Take 1 tablet (10 mg total) by mouth at bedtime. (Patient not taking: Reported on 02/15/2021) 90 tablet 3  ? ?No current facility-administered medications for this visit.  ?  ?Allergies: Patient has no known allergies.  ?Past Medical History:  ?Diagnosis Date  ? Anemia   ? Asthma   ? Dyspareunia, female   ? Fibroid   ? Headache(784.0)   ? History of fatigue 03/10/11  ? Irregular menses   ? Migraine   ? Ovarian tumor   ? Painful intercourse 11/15/10  ?   ?Past Surgical History:  ?Procedure Laterality Date  ? CESAREAN SECTION N/A 07/18/2013  ? Procedure: CESAREAN SECTION;  Surgeon: Ena Dawley, MD;  Location: [facility];  Service: Obstetrics;  Laterality: N/A;  ? HYSTEROSCOPY    ?  ?Family History  ?Adopted: Yes  ?Problem Relation Age of Onset  ? Sickle cell trait Daughter   ?  ?Social History  ? ?Tobacco Use  ? Smoking status: Never  ? Smokeless tobacco: Never  ?Substance Use Topics  ? Alcohol use: No  ?  Alcohol/week: 0.0 standard drinks  ?  ?Subjective:  ? ? ?I connected with Teresa Brennan on 05/10/21 at  3:00 PM EDT by a video enabled telemedicine application and verified that I am speaking with the correct person using two identifiers. ?  ?I discussed the limitations of evaluation and management by telemedicine and the availability of in person appointments. The patient expressed understanding and agreed to proceed. ?Provider in office/ patient is at home; provider and patient are only 2 people on video call.  ? ?Notes that she had COVID 2 weeks ago; is concerned about persisting head and chest congestion; feels like she may have a sinus infection; requesting antibiotic;  ? ?LMP continual cycling ? ? ?Objective:  ?There were no vitals filed for this visit.  ?General: Well developed, well nourished, in  no acute distress  ?Lungs: Respirations unlabored;  ?Neurologic: Alert and oriented; speech intact; face symmetrical; moves all extremities well;  ? ?Assessment:  ?1. Acute sinusitis, recurrence not specified, unspecified location   ?  ?Plan:  ? ?Rx for Augmentin 875 mg bid x 10 days; increase fluids, rest and follow up worse, no better. Will need in person visit if symptoms persist.  ? ?No follow-ups on file.  ?No orders of the defined types were placed in this encounter. ?  ?Requested Prescriptions  ? ?Signed Prescriptions Disp Refills  ? amoxicillin-clavulanate (AUGMENTIN) 875-125 MG tablet 20 tablet 0  ?  Sig: Take 1 tablet by mouth 2 (two) times daily for 10  days.  ?  ? ?

## 2021-05-30 NOTE — Progress Notes (Deleted)
Synopsis: Referred for chronic cough by Marrian Salvage,*  Subjective:   PATIENT ID: Teresa Brennan GENDER: female DOB: 02-02-1985, MRN: [id number]  No chief complaint on file.  51yF with pernicious anemia, asthma when she was a kid, covid-19 infection in 01/2020 who is referred for chronic cough.  She had been vaccinated. Got covid 02/2020, never saw physician for it, never was hospitalized. Can't shake cough though. She tried singulair and has been taking it ever since. Cough is worse when off of singulair. She also takes zyrtec for allergy but doesn't think it's helpful. She doesn't have sinus congestion. Does have PND. Does not like using flonase. No clearly symptomatic reflux. It is a dry cough. No accompanying throat tightness or voice change with coughing spells.   She has mild DOE which she attributes to being overweight. It is probably worse since covid. Hasn't been working out as much over this time frame  No family history of lung disease but she was adopted.  She has previously lived in [place], urban. 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 She has no pets. No water damage or hot tub at home.   Interval HPI   Otherwise pertinent review of systems is negative.   Past Medical History:  Diagnosis Date   Anemia    Asthma    Dyspareunia, female    Fibroid    Headache(784.0)    History of fatigue 03/10/11   Irregular menses    Migraine    Ovarian tumor    Painful intercourse 11/15/10     Family History  Adopted: Yes  Problem Relation Age of Onset   Sickle cell trait Daughter      Past Surgical History:  Procedure Laterality Date   CESAREAN SECTION N/A 07/18/2013   Procedure: CESAREAN SECTION;  Surgeon: Ena Dawley, MD;  Location: [facility];  Service: Obstetrics;  Laterality: N/A;   HYSTEROSCOPY      Social History   Socioeconomic History   Marital status: Single    Spouse name: n/a    Number of children: 1   Years of education: 12+   Highest education level: Not on file  Occupational History   Occupation: billing specialist    Comment: [employer]  Tobacco Use   Smoking status: Never   Smokeless tobacco: Never  Substance and Sexual Activity   Alcohol use: No    Alcohol/week: 0.0 standard drinks   Drug use: No   Sexual activity: Yes    Partners: Male    Birth control/protection: Pill  Other Topics Concern   Not on file  Social History Narrative   Working on an associates degree in Nature conservation officer.   Adopted, along with her fraternal twin sister, at 84 days old.   She is in contact with other biological siblings.   Lives alone, with her daughter, born in 06/2013.   Her adoptive parents are both deceased.   Her sister lives nearby.   Social Determinants of Health   Financial Resource Strain: Not on file  Food Insecurity: Not on file  Transportation Needs: Not on file  Physical Activity: Not on file  Stress: Not on file  Social Connections: Not on file  Intimate Partner Violence: Not on file     No Known Allergies   Outpatient Medications Prior to Visit  Medication Sig Dispense Refill   cetirizine (ZYRTEC) 10 MG tablet Take 1 tablet (10 mg total) by mouth at bedtime. (Patient not  taking: Reported on 05/10/2021) 90 tablet 3   cyanocobalamin (,VITAMIN B-12,) 1000 MCG/ML injection INJECT 1 ML INTO THE MUSCLE ONCE A WEEK 12 mL 1   DAYSEE 0.15-0.03 &0.01 MG tablet   4   DENTAGEL 1.1 % GEL dental gel      escitalopram (LEXAPRO) 10 MG tablet Take 1 tablet (10 mg total) by mouth daily. OV due for further refills 30 tablet 1   montelukast (SINGULAIR) 10 MG tablet Take 1 tablet (10 mg total) by mouth at bedtime. (Patient not taking: Reported on 02/15/2021) 90 tablet 3   Needles & Syringes MISC 1 Syringe by Does not apply route once a week. 30 each 3   valACYclovir (VALTREX) 500 MG tablet Take 1 tablet (500 mg total) by mouth 2 (two) times daily as needed for 3 days 30  tablet 1   VITAMIN D PO Take 2,000 Int'l Units by mouth.     No facility-administered medications prior to visit.       Objective:   Physical Exam:  General appearance: 36 y.o., female, NAD, conversant  Eyes: anicteric sclerae; PERRL, tracking appropriately HENT: NCAT; MMM Neck: Trachea midline; no lymphadenopathy, no JVD Lungs: CTAB, no crackles, no wheeze, with normal respiratory effort CV: RRR, no murmur  Abdomen: Soft, non-tender; non-distended, BS present  Extremities: No peripheral edema, warm Skin: Normal turgor and texture; no rash Psych: Appropriate affect Neuro: Alert and oriented to person and place, no focal deficit    There were no vitals filed for this visit.     on RA BMI Readings from Last 3 Encounters:  02/15/21 32.14 kg/m  11/10/20 34.02 kg/m  10/25/20 33.56 kg/m   Wt Readings from Last 3 Encounters:  02/15/21 211 lb 6.4 oz (95.9 kg)  11/10/20 210 lb 12.8 oz (95.6 kg)  10/25/20 207 lb 14.4 oz (94.3 kg)     CBC    Component Value Date/Time   WBC 5.3 10/25/2020 0836   WBC 6.0 06/12/2018 1139   RBC 5.12 (H) 10/25/2020 0836   HGB 14.2 10/25/2020 0836   HGB 14.2 05/24/2015 1246   HGB 13.8 10/05/2014 1556   HCT 42.1 10/25/2020 0836   HCT 42.5 05/24/2015 1246   HCT 41.9 10/05/2014 1556   PLT 284 10/25/2020 0836   PLT 323 05/24/2015 1246   MCV 82.2 10/25/2020 0836   MCV 86 05/24/2015 1246   MCV 82.1 10/05/2014 1556   MCH 27.7 10/25/2020 0836   MCHC 33.7 10/25/2020 0836   RDW 12.1 10/25/2020 0836   RDW 12.9 05/24/2015 1246   RDW 13.3 10/05/2014 1556   LYMPHSABS 1.6 10/25/2020 0836   LYMPHSABS 2.3 05/24/2015 1246   LYMPHSABS 1.6 10/05/2014 1556   MONOABS 0.5 10/25/2020 0836   MONOABS 0.5 10/05/2014 1556   EOSABS 0.1 10/25/2020 0836   EOSABS 0.1 05/24/2015 1246   BASOSABS 0.1 10/25/2020 0836   BASOSABS 0.0 05/24/2015 1246   BASOSABS 0.0 10/05/2014 1556    Unremarkable eosinophilia historically  Chest Imaging: CXR 03/24/2020  reviewed by me and unremarkable  CTA Chest 04/2014 reviewed by me and unremarkable   Pulmonary Functions Testing Results: PFT 02/15/21 reviewed by me - normal     Assessment & Plan:   # DOE: May be dealing with asthma or reactive airways post covid-19 although pre/post BD spiro WNL or simply with deconditioning. Covid-ILD seems less likely given clear CXR earlier this year and DLCO wnl.  # Chronic cough: Much improved, essentially resolved.   Plan: - flonase 1 spray  each nare after clearing nose of crusting following shower or neti pot irrigation - if DOE not improving with efforts at exercise to work on deconditioning she'll let us know if we should proceed with methacholine challenge     Maryjane Hurter, MD [facility] 05/30/2021 3:58 PM

## 2021-05-31 ENCOUNTER — Ambulatory Visit: Payer: 59 | Admitting: Student

## 2021-07-04 ENCOUNTER — Encounter: Payer: Self-pay | Admitting: Internal Medicine

## 2021-09-02 ENCOUNTER — Encounter: Payer: Self-pay | Admitting: Internal Medicine

## 2021-10-25 ENCOUNTER — Other Ambulatory Visit: Payer: Self-pay | Admitting: *Deleted

## 2021-10-25 DIAGNOSIS — R7989 Other specified abnormal findings of blood chemistry: Secondary | ICD-10-CM

## 2021-10-25 DIAGNOSIS — E538 Deficiency of other specified B group vitamins: Secondary | ICD-10-CM

## 2021-10-25 NOTE — Progress Notes (Signed)
HEMATOLOGY-ONCOLOGY TELEMETRY VISIT PROGRESS NOTE  I connected with [name] on 10/28/2021 at  9:30 AM EDT by telephone visit and verified that I am speaking with the correct person using two identifiers.  I discussed the limitations, risks, security and privacy concerns of performing an evaluation and management service by Webex and the availability of in person appointments.  I also discussed with the patient that there may be a patient responsible charge related to this service. The patient expressed understanding and agreed to proceed.  Patient's Location: Home Physician Location: Clinic  CHIEF COMPLIANT: Follow-up of pernicious anemia    INTERVAL HISTORY: Teresa Brennan is a 36 y.o. female with above-mentioned history of 36 y.o. with above-mentioned history of pernicious anemia. She presents to the clinic today for telephone visit  REVIEW OF SYSTEMS:   Constitutional: Denies fevers, chills or abnormal weight loss   All other systems were reviewed with the patient and are negative.  Observations/Objective:  There were no vitals filed for this visit. There is no height or weight on file to calculate BMI.  I have reviewed the data as listed    Latest Ref Rng & Units 06/12/2018   11:39 AM 12/24/2017    9:01 AM 05/24/2015   12:46 PM  CMP  Glucose 70 - 99 mg/dL 73  90  75   BUN 6 - 23 mg/dL '10  11  9   '$ Creatinine 0.40 - 1.20 mg/dL 0.94  0.98  0.93   Sodium 135 - 145 mEq/L 138  138  141   Potassium 3.5 - 5.1 mEq/L 3.9  3.9  3.8   Chloride 96 - 112 mEq/L 105  105  102   CO2 19 - 32 mEq/L '25  25  26   '$ Calcium 8.4 - 10.5 mg/dL 8.9  8.9  9.6   Total Protein 6.0 - 8.3 g/dL 7.2  6.9  7.0   Total Bilirubin 0.2 - 1.2 mg/dL 0.3  0.6  0.4   Alkaline Phos 39 - 117 U/L 59  49  68   AST 0 - 37 U/L '14  15  14   '$ ALT 0 - 35 U/L '14  16  18     '$ Lab Results  Component Value Date   WBC 4.3 10/27/2021   HGB 14.0 10/27/2021   HCT 41.5 10/27/2021   MCV 80.6 10/27/2021   PLT 317 10/27/2021    NEUTROABS 2.1 10/27/2021      Assessment Plan:  Vitamin B12 deficiency Lab review 12/24/2017: Hemoglobin 13.9, MCV 107.4, vitamin B12 39 Ferritin 247, iron saturation 50% 10/27/2021: Hemoglobin 14, B12 397, ferritin 23, iron saturation 18%   Causes of elevated ferritin: Inflammation versus iron overload The lab test did not make sense. Repeat Testing: Normal hemochromatosis gene testing: Neg       Severe vitamin B12 deficiency: Previously diagnosed as pernicious anemia. Currently doing B12 self injections.  Return to clinic in 1 year for labs and follow-up    I discussed the assessment and treatment plan with the patient. The patient was provided an opportunity to ask questions and all were answered. The patient agreed with the plan and demonstrated an understanding of the instructions. The patient was advised to call back or seek an in-person evaluation if the symptoms worsen or if the condition fails to improve as anticipated.   I provided 12 minutes of face-to-face Web Ex time during this encounter.    Rulon Eisenmenger, MD 10/28/2021 I Gardiner Coins am scribing for Dr.  Deeanna Beightol  I have reviewed the above documentation for accuracy and completeness, and I agree with the above.

## 2021-10-26 ENCOUNTER — Inpatient Hospital Stay: Payer: 59

## 2021-10-27 ENCOUNTER — Inpatient Hospital Stay: Payer: 59 | Attending: Hematology and Oncology

## 2021-10-27 ENCOUNTER — Other Ambulatory Visit: Payer: Self-pay

## 2021-10-27 DIAGNOSIS — D510 Vitamin B12 deficiency anemia due to intrinsic factor deficiency: Secondary | ICD-10-CM | POA: Diagnosis present

## 2021-10-27 DIAGNOSIS — R7989 Other specified abnormal findings of blood chemistry: Secondary | ICD-10-CM

## 2021-10-27 DIAGNOSIS — E538 Deficiency of other specified B group vitamins: Secondary | ICD-10-CM

## 2021-10-27 LAB — CBC WITH DIFFERENTIAL (CANCER CENTER ONLY)
Abs Immature Granulocytes: 0.01 10*3/uL (ref 0.00–0.07)
Basophils Absolute: 0 10*3/uL (ref 0.0–0.1)
Basophils Relative: 1 %
Eosinophils Absolute: 0.1 10*3/uL (ref 0.0–0.5)
Eosinophils Relative: 2 %
HCT: 41.5 % (ref 36.0–46.0)
Hemoglobin: 14 g/dL (ref 12.0–15.0)
Immature Granulocytes: 0 %
Lymphocytes Relative: 42 %
Lymphs Abs: 1.8 10*3/uL (ref 0.7–4.0)
MCH: 27.2 pg (ref 26.0–34.0)
MCHC: 33.7 g/dL (ref 30.0–36.0)
MCV: 80.6 fL (ref 80.0–100.0)
Monocytes Absolute: 0.3 10*3/uL (ref 0.1–1.0)
Monocytes Relative: 6 %
Neutro Abs: 2.1 10*3/uL (ref 1.7–7.7)
Neutrophils Relative %: 49 %
Platelet Count: 317 10*3/uL (ref 150–400)
RBC: 5.15 MIL/uL — ABNORMAL HIGH (ref 3.87–5.11)
RDW: 12.2 % (ref 11.5–15.5)
WBC Count: 4.3 10*3/uL (ref 4.0–10.5)
nRBC: 0 % (ref 0.0–0.2)

## 2021-10-27 LAB — FERRITIN: Ferritin: 23 ng/mL (ref 11–307)

## 2021-10-27 LAB — IRON AND IRON BINDING CAPACITY (CC-WL,HP ONLY)
Iron: 73 ug/dL (ref 28–170)
Saturation Ratios: 18 % (ref 10.4–31.8)
TIBC: 416 ug/dL (ref 250–450)
UIBC: 343 ug/dL (ref 148–442)

## 2021-10-27 LAB — VITAMIN B12: Vitamin B-12: 397 pg/mL (ref 180–914)

## 2021-10-27 NOTE — Assessment & Plan Note (Signed)
Lab review 12/24/2017: Hemoglobin 13.9, MCV 107.4, vitamin B12 39 Ferritin 247, iron saturation 50%  Causes of elevated ferritin: Inflammation versus iron overload The lab test did not make sense. Repeat Testing: Normal hemochromatosis gene testing: Neg    Severe vitamin B12 deficiency:Previously diagnosed aspernicious anemia. Currently doing B12 self injections.  Return to clinic in 1 year for labs and follow-up

## 2021-10-28 ENCOUNTER — Inpatient Hospital Stay (HOSPITAL_BASED_OUTPATIENT_CLINIC_OR_DEPARTMENT_OTHER): Payer: 59 | Admitting: Hematology and Oncology

## 2021-10-28 DIAGNOSIS — E538 Deficiency of other specified B group vitamins: Secondary | ICD-10-CM | POA: Diagnosis not present

## 2021-10-28 DIAGNOSIS — R7989 Other specified abnormal findings of blood chemistry: Secondary | ICD-10-CM | POA: Diagnosis not present

## 2021-10-28 MED ORDER — CYANOCOBALAMIN 1000 MCG/ML IJ SOLN: INTRAMUSCULAR | 4 refills | Status: DC

## 2021-10-28 MED ORDER — NEEDLES & SYRINGES MISC: 1.0000 | 3 refills | Status: DC

## 2021-10-31 ENCOUNTER — Telehealth: Payer: Self-pay | Admitting: Hematology and Oncology

## 2021-10-31 NOTE — Telephone Encounter (Signed)
Scheduled appointment per 10/6 los. Patient is aware.

## 2021-12-01 ENCOUNTER — Telehealth: Payer: Self-pay | Admitting: Family

## 2021-12-01 NOTE — Telephone Encounter (Signed)
Pt called to request a Transfer of Care -   From: L. Valere Dross, [place] @ [facility]  To: Lockie Pares, [place] @ [facility]  Are both providers okay with this request?  Please advise.  Respectfully, Isa C.

## 2021-12-22 NOTE — Telephone Encounter (Signed)
Ok

## 2021-12-23 NOTE — Telephone Encounter (Signed)
Yes, thank you.

## 2021-12-26 ENCOUNTER — Encounter: Payer: Self-pay | Admitting: Family Medicine

## 2021-12-26 ENCOUNTER — Ambulatory Visit (INDEPENDENT_AMBULATORY_CARE_PROVIDER_SITE_OTHER): Payer: 59 | Admitting: Family Medicine

## 2021-12-26 VITALS — BP 122/82 | HR 98 | Temp 98.9°F | Wt 213.8 lb

## 2021-12-26 DIAGNOSIS — E6609 Other obesity due to excess calories: Secondary | ICD-10-CM | POA: Diagnosis not present

## 2021-12-26 DIAGNOSIS — Z6832 Body mass index (BMI) 32.0-32.9, adult: Secondary | ICD-10-CM

## 2021-12-26 DIAGNOSIS — F411 Generalized anxiety disorder: Secondary | ICD-10-CM | POA: Diagnosis not present

## 2021-12-26 DIAGNOSIS — E538 Deficiency of other specified B group vitamins: Secondary | ICD-10-CM

## 2021-12-26 DIAGNOSIS — R030 Elevated blood-pressure reading, without diagnosis of hypertension: Secondary | ICD-10-CM

## 2021-12-26 MED ORDER — SERTRALINE HCL 25 MG PO TABS: 25.0000 mg | ORAL_TABLET | Freq: Every day | ORAL | 3 refills | Status: DC

## 2021-12-26 NOTE — Patient Instructions (Addendum)
We will have you follow-up the new medication is going.  Behavioral Health Services: -to make an appointment contact the office/provider you are interested in seeing.  No referral is needed.  The below is not an all inclusive list, but will help you get started.  [email or web address] -counseling located off of [address].  [email or web address] -website helps you find providers in your area  [facility] -Located off of [address]. across from [place] [address]  Dr. Darleene Cleaver is a Teacher, music with [facility]. [phone]  [facility]  [phone] -a place in town that has counseling and Psychiatry services.

## 2021-12-26 NOTE — Progress Notes (Signed)
Subjective:    Patient ID: Teresa Brennan, female    DOB: 1985/12/16, 36 y.o.   MRN: [id number]  Chief Complaint  Patient presents with   Establish Care    HPI Patient was seen today for [facility], previously seen by Jodi Mourning, FNP.    Low b12: -followed by Endo -on wkly b12 injections  Anxiety/depression: -endorses symptoms x a yr or -Anxiety worse in the last 6 months -Given Rx for Lexapro 10 mg.  Was not taking consistently as did not feel it was helping symptoms. -lexapro dose was not adjusted. -has never been on any other med for symptoms -pt inquires about as needed meds in addition to daily med. -states does not want to be on a med for depression as she feels anxiety is the issue.  Weight concern: -pt interested in wt loss medications -previously seen by [facility] wt loss.  States they wanted to start an appetite suppressant however did not 2/2 concerns for patient's elevated resting heart rate. -Patient was on optivia, but noticed wt gain after stopping the supplements -has lost wt on her own in the past when was motivated. -currently endorses eating "junk"/foods that are convenient.  Drinking soda regularly.  Not currently exercising as not motivated.  Social hx: Pt has a daughter. Pt works in Building surveyor. Past Medical History:  Diagnosis Date   Anemia    Asthma    Dyspareunia, female    Fibroid    Headache(784.0)    History of fatigue 03/10/11   Irregular menses    Migraine    Ovarian tumor    Painful intercourse 11/15/10    No Known Allergies  ROS General: Denies fever, chills, night sweats,changes in appetite + changes in wt HEENT: Denies headaches, ear pain, changes in vision, rhinorrhea, sore throat CV: Denies CP, palpitations, SOB, orthopnea Pulm: Denies SOB, cough, wheezing GI: Denies abdominal pain, nausea, vomiting, diarrhea, constipation GU: Denies dysuria, hematuria, frequency, vaginal discharge Msk: Denies muscle cramps, joint pains Neuro: Denies  weakness, numbness, tingling Skin: Denies rashes, bruising Psych: Denies depression, hallucinations   +anxiety    Objective:    Blood pressure 122/82, pulse 98, temperature 98.9 F (37.2 C), temperature source Oral, weight 213 lb 12.8 oz (97 kg), SpO2 99 %.  Body mass index is 32.51 kg/m.  Gen. Pleasant, well-nourished, in no distress, normal affect   HEENT: [place]/AT, face symmetric, conjunctiva clear, no scleral icterus, PERRLA, EOMI, nares patent without drainage Lungs: no accessory muscle use, CTAB, no wheezes or rales Cardiovascular: RRR, no m/r/g, no peripheral edema Musculoskeletal: No deformities, no cyanosis or clubbing, normal tone Neuro:  A&Ox3, CN II-XII intact, normal gait Skin:  Warm, no lesions/ rash   Wt Readings from Last 3 Encounters:  12/26/21 213 lb 12.8 oz (97 kg)  02/15/21 211 lb 6.4 oz (95.9 kg)  11/10/20 210 lb 12.8 oz (95.6 kg)    Lab Results  Component Value Date   WBC 4.3 10/27/2021   HGB 14.0 10/27/2021   HCT 41.5 10/27/2021   PLT 317 10/27/2021   GLUCOSE 73 06/12/2018   CHOL 144 06/12/2018   TRIG 76.0 06/12/2018   HDL 43.70 06/12/2018   LDLCALC 85 06/12/2018   ALT 14 06/12/2018   AST 14 06/12/2018   NA 138 06/12/2018   K 3.9 06/12/2018   CL 105 06/12/2018   CREATININE 0.94 06/12/2018   BUN 10 06/12/2018   CO2 25 06/12/2018   TSH 0.58 12/24/2017      12/26/2021   11:13 AM  05/10/2021    2:46 PM 03/24/2020   10:08 AM  Depression screen PHQ 2/9  Decreased Interest 1 0 0  Down, Depressed, Hopeless 1 0 0  PHQ - 2 Score 2 0 0  Altered sleeping 3    Tired, decreased energy 3    Change in appetite 2    Feeling bad or failure about yourself  1    Trouble concentrating 0    Moving slowly or fidgety/restless 0    Suicidal thoughts 0    PHQ-9 Score 11        12/26/2021   11:14 AM  GAD 7 : Generalized Anxiety Score  Nervous, Anxious, on Edge 3  Control/stop worrying 3  Worry too much - different things 3  Trouble relaxing 2  Restless 1   Easily annoyed or irritable 1  Afraid - awful might happen 1  Total GAD 7 Score 14  Anxiety Difficulty Somewhat difficult     Assessment/Plan:  GAD (generalized anxiety disorder)  -GAD-7 score 14 -PHQ-9 score 11 this visit -Discussed medication and counseling options. -Consider appointment with psychiatry for continued symptoms. - Plan: sertraline (ZOLOFT) 25 MG tablet  Vitamin B12 deficiency  Class 1 obesity due to excess calories without serious comorbidity with body mass index (BMI) of 32.0 to 32.9 in adult  -Body mass index is 32.51 kg/m. - Plan: Amb Ref to Medical Weight Management  Elevated blood pressure reading without diagnosis of hypertension -Discussed lifestyle modifications -Patient encouraged to check BP at home with continued elevation consistently greater than 140/90 start medication.  F/u in 4-6 wks  Grier Mitts, MD

## 2022-01-04 ENCOUNTER — Encounter: Payer: 59 | Admitting: Bariatrics

## 2022-01-05 ENCOUNTER — Encounter: Payer: Self-pay | Admitting: Internal Medicine

## 2022-01-07 ENCOUNTER — Encounter: Payer: Self-pay | Admitting: Internal Medicine

## 2022-01-09 ENCOUNTER — Ambulatory Visit: Payer: 59 | Admitting: Bariatrics

## 2022-01-19 ENCOUNTER — Ambulatory Visit (INDEPENDENT_AMBULATORY_CARE_PROVIDER_SITE_OTHER): Payer: 59 | Admitting: Bariatrics

## 2022-01-19 ENCOUNTER — Encounter: Payer: Self-pay | Admitting: Bariatrics

## 2022-01-19 VITALS — BP 124/88 | HR 96 | Temp 98.2°F | Ht 65.0 in | Wt 208.0 lb

## 2022-01-19 DIAGNOSIS — R0602 Shortness of breath: Secondary | ICD-10-CM | POA: Diagnosis not present

## 2022-01-19 DIAGNOSIS — E538 Deficiency of other specified B group vitamins: Secondary | ICD-10-CM | POA: Diagnosis not present

## 2022-01-19 DIAGNOSIS — Z833 Family history of diabetes mellitus: Secondary | ICD-10-CM

## 2022-01-19 DIAGNOSIS — Z1331 Encounter for screening for depression: Secondary | ICD-10-CM | POA: Diagnosis not present

## 2022-01-19 DIAGNOSIS — R5383 Other fatigue: Secondary | ICD-10-CM | POA: Diagnosis not present

## 2022-01-19 DIAGNOSIS — D510 Vitamin B12 deficiency anemia due to intrinsic factor deficiency: Secondary | ICD-10-CM

## 2022-01-19 DIAGNOSIS — E559 Vitamin D deficiency, unspecified: Secondary | ICD-10-CM | POA: Diagnosis not present

## 2022-01-19 DIAGNOSIS — E669 Obesity, unspecified: Secondary | ICD-10-CM

## 2022-01-19 DIAGNOSIS — Z6834 Body mass index (BMI) 34.0-34.9, adult: Secondary | ICD-10-CM

## 2022-01-19 DIAGNOSIS — Z0289 Encounter for other administrative examinations: Secondary | ICD-10-CM

## 2022-01-20 LAB — HEMOGLOBIN A1C
Est. average glucose Bld gHb Est-mCnc: 120 mg/dL
Hgb A1c MFr Bld: 5.8 % — ABNORMAL HIGH (ref 4.8–5.6)

## 2022-01-20 LAB — COMPREHENSIVE METABOLIC PANEL
ALT: 13 IU/L (ref 0–32)
AST: 15 IU/L (ref 0–40)
Albumin/Globulin Ratio: 1.5 (ref 1.2–2.2)
Albumin: 4.1 g/dL (ref 3.9–4.9)
Alkaline Phosphatase: 79 IU/L (ref 44–121)
BUN/Creatinine Ratio: 9 (ref 9–23)
BUN: 9 mg/dL (ref 6–20)
Bilirubin Total: 0.4 mg/dL (ref 0.0–1.2)
CO2: 22 mmol/L (ref 20–29)
Calcium: 9.6 mg/dL (ref 8.7–10.2)
Chloride: 106 mmol/L (ref 96–106)
Creatinine, Ser: 1.01 mg/dL — ABNORMAL HIGH (ref 0.57–1.00)
Globulin, Total: 2.7 g/dL (ref 1.5–4.5)
Glucose: 82 mg/dL (ref 70–99)
Potassium: 4.3 mmol/L (ref 3.5–5.2)
Sodium: 140 mmol/L (ref 134–144)
Total Protein: 6.8 g/dL (ref 6.0–8.5)
eGFR: 74 mL/min/{1.73_m2} (ref >59)

## 2022-01-20 LAB — LIPID PANEL
Chol/HDL Ratio: 4.2 ratio (ref 0.0–4.4)
Cholesterol, Total: 164 mg/dL (ref 100–199)
HDL: 39 mg/dL — ABNORMAL LOW (ref >39)
LDL Chol Calc (NIH): 111 mg/dL — ABNORMAL HIGH (ref 0–99)
Triglycerides: 70 mg/dL (ref 0–149)
VLDL Cholesterol Cal: 14 mg/dL (ref 5–40)

## 2022-01-20 LAB — TSH: TSH: 0.764 u[IU]/mL (ref 0.450–4.500)

## 2022-01-20 LAB — T3: T3, Total: 202 ng/dL — ABNORMAL HIGH (ref 71–180)

## 2022-01-20 LAB — VITAMIN D 25 HYDROXY (VIT D DEFICIENCY, FRACTURES): Vit D, 25-Hydroxy: 37.3 ng/mL (ref 30.0–100.0)

## 2022-01-20 LAB — T4, FREE: Free T4: 1.18 ng/dL (ref 0.82–1.77)

## 2022-01-20 LAB — INSULIN, RANDOM: INSULIN: 30.7 u[IU]/mL — ABNORMAL HIGH (ref 2.6–24.9)

## 2022-01-24 ENCOUNTER — Encounter (INDEPENDENT_AMBULATORY_CARE_PROVIDER_SITE_OTHER): Payer: Self-pay | Admitting: Bariatrics

## 2022-01-24 DIAGNOSIS — E786 Lipoprotein deficiency: Secondary | ICD-10-CM | POA: Insufficient documentation

## 2022-01-24 DIAGNOSIS — R7303 Prediabetes: Secondary | ICD-10-CM | POA: Insufficient documentation

## 2022-01-24 DIAGNOSIS — E88819 Insulin resistance, unspecified: Secondary | ICD-10-CM | POA: Insufficient documentation

## 2022-01-26 ENCOUNTER — Ambulatory Visit: Payer: 59 | Admitting: Bariatrics

## 2022-01-30 ENCOUNTER — Encounter: Payer: Self-pay | Admitting: Family Medicine

## 2022-01-30 ENCOUNTER — Ambulatory Visit (INDEPENDENT_AMBULATORY_CARE_PROVIDER_SITE_OTHER): Payer: 59 | Admitting: Family Medicine

## 2022-01-30 VITALS — BP 94/70 | HR 105 | Temp 99.0°F | Wt 212.8 lb

## 2022-01-30 DIAGNOSIS — Z6832 Body mass index (BMI) 32.0-32.9, adult: Secondary | ICD-10-CM | POA: Diagnosis not present

## 2022-01-30 DIAGNOSIS — E6609 Other obesity due to excess calories: Secondary | ICD-10-CM | POA: Diagnosis not present

## 2022-01-30 DIAGNOSIS — F411 Generalized anxiety disorder: Secondary | ICD-10-CM

## 2022-01-30 DIAGNOSIS — L709 Acne, unspecified: Secondary | ICD-10-CM

## 2022-01-30 MED ORDER — SERTRALINE HCL 50 MG PO TABS: 50.0000 mg | ORAL_TABLET | Freq: Every day | ORAL | 3 refills | Status: AC

## 2022-01-30 NOTE — Progress Notes (Signed)
Subjective:    Patient ID: Teresa Brennan, female    DOB: 29-Mar-1985, 37 y.o.   MRN: [id number]  Chief Complaint  Patient presents with   Follow-up    med    HPI Patient was seen today for f/u on GAD.  Started on Zoloft 25 mg at last OFV.  Taking med in a.m. with food.  Endorses intermittent nausea and loose stools since taking medication.  Does not happen daily.  Patient states anxiety is about the same.  Feels overwhelmed at times.  Previously on Lexapro but was taking intermittently.  Patient had follow-up with weight management clinic.  Trying to increase protein and eat low calorie diet.  Plans on starting exercising.  Patient inquires about refill on tretinoin cream for acne.  Previously seen by [facility] dermatology, Dr. Martin Majestic.  Past Medical History:  Diagnosis Date   Anemia    Anxiety    Asthma    B12 deficiency    Dyspareunia, female    Fibroid    Headache(784.0)    History of fatigue 03/10/2011   Irregular menses    Migraine    Ovarian tumor    Painful intercourse 11/15/2010   SOB (shortness of breath)    Vitamin D deficiency     No Known Allergies  ROS General: Denies fever, chills, night sweats, changes in weight, changes in appetite HEENT: Denies headaches, ear pain, changes in vision, rhinorrhea, sore throat CV: Denies CP, palpitations, SOB, orthopnea Pulm: Denies SOB, cough, wheezing GI: Denies abdominal pain, nausea, vomiting, diarrhea, constipation GU: Denies dysuria, hematuria, frequency, vaginal discharge Msk: Denies muscle cramps, joint pains Neuro: Denies weakness, numbness, tingling Skin: Denies rashes, bruising Psych: Denies depression, hallucinations  anxiety     Objective:    Blood pressure 94/70, pulse (!) 105, temperature 99 F (37.2 C), temperature source Oral, weight 212 lb 12.8 oz (96.5 kg), SpO2 99 %.Body mass index is 35.41 kg/m.  Gen. Pleasant, well-nourished, in no distress, normal affect   HEENT: [place]/AT, face symmetric,  conjunctiva clear, no scleral icterus, PERRLA, EOMI, nares patent without drainage Lungs: no accessory muscle use Cardiovascular: RRR, no peripheral edema Neuro:  A&Ox3, CN II-XII intact, normal gait Skin:  Warm, no lesions/ rash  Wt Readings from Last 3 Encounters:  01/30/22 212 lb 12.8 oz (96.5 kg)  01/19/22 208 lb (94.3 kg)  12/26/21 213 lb 12.8 oz (97 kg)    Lab Results  Component Value Date   WBC 4.3 10/27/2021   HGB 14.0 10/27/2021   HCT 41.5 10/27/2021   PLT 317 10/27/2021   GLUCOSE 82 01/19/2022   CHOL 164 01/19/2022   TRIG 70 01/19/2022   HDL 39 (L) 01/19/2022   LDLCALC 111 (H) 01/19/2022   ALT 13 01/19/2022   AST 15 01/19/2022   NA 140 01/19/2022   K 4.3 01/19/2022   CL 106 01/19/2022   CREATININE 1.01 (H) 01/19/2022   BUN 9 01/19/2022   CO2 22 01/19/2022   TSH 0.764 01/19/2022   HGBA1C 5.8 (H) 01/19/2022      01/30/2022   10:11 AM 12/26/2021   11:13 AM 05/10/2021    2:46 PM 03/24/2020   10:08 AM 07/31/2018    8:18 AM  Depression screen PHQ 2/9  Decreased Interest 1 1 0 0 0  Down, Depressed, Hopeless 1 1 0 0 0  PHQ - 2 Score 2 2 0 0 0  Altered sleeping 2 3     Tired, decreased energy 2 3     Change  in appetite 1 2     Feeling bad or failure about yourself   1     Trouble concentrating 0 0     Moving slowly or fidgety/restless 0 0     Suicidal thoughts 0 0     PHQ-9 Score 7 11     Difficult doing work/chores Somewhat difficult          01/30/2022   10:12 AM 12/26/2021   11:14 AM  GAD 7 : Generalized Anxiety Score  Nervous, Anxious, on Edge 2 3  Control/stop worrying 2 3  Worry too much - different things 2 3  Trouble relaxing 2 2  Restless 0 1  Easily annoyed or irritable 2 1  Afraid - awful might happen 0 1  Total GAD 7 Score 10 14  Anxiety Difficulty Very difficult Somewhat difficult      Assessment/Plan:  GAD (generalized anxiety disorder) -GAD-7 score 10 this visit, previously 14. -PHQ-9 score 7 this visit, previously 11 -Will increase  dose of Zoloft from 25 to 50 mg daily. -For continued or worsening GI side effects switch medication. -Consider counseling  - Plan: sertraline (ZOLOFT) 50 MG tablet  Class 1 obesity due to excess calories without serious comorbidity with body mass index (BMI) of 32.0 to 32.9 in adult -Body mass index is 35.41 kg/m. -Continue low calorie/high-protein diet -Continue other lifestyle modifications -Continue follow-up with weight management  Acne, unspecified acne type -Continue current medications -Advised to schedule follow-up with dermatology  F/u for-6 weeks, sooner if needed  Grier Mitts, MD

## 2022-01-30 NOTE — Patient Instructions (Signed)
We will have you follow-up in 4-6 weeks to see how things are going with the new dose of medication.  Of course if you feel like the increased dose is not working or you are having any increased effects from it call the clinic to let us know sooner so we can make adjustments by lowering the dose and/or switching medications.

## 2022-02-02 ENCOUNTER — Ambulatory Visit: Payer: 59 | Admitting: Bariatrics

## 2022-02-04 NOTE — Progress Notes (Unsigned)
Chief Complaint:   OBESITY Teresa Brennan (MR# [id number]) is a 37 y.o. female who presents for evaluation and treatment of obesity and related comorbidities. Current BMI is Body mass index is 34.61 kg/m. Teresa Brennan has been struggling with her weight for many years and has been unsuccessful in either losing weight, maintaining weight loss, or reaching her healthy weight goal.  Teresa Brennan attended the information session. She states likes to cook sometimes, but she notes time and food as an obstacle. She craves junk food. She considers herself to be a picky eater.  Teresa Brennan is currently in the action stage of change and ready to dedicate time achieving and maintaining a healthier weight. Teresa Brennan is interested in becoming our patient and working on intensive lifestyle modifications including (but not limited to) diet and exercise for weight loss.  Teresa Brennan habits were reviewed today and are as follows: Her family eats meals together, she thinks her family will eat healthier with her, her desired weight loss is 43 lbs, she started gaining weight at 22+, her heaviest weight ever was 208 pounds, she is a picky eater and doesn't like to eat healthier foods, she has significant food cravings issues, she snacks frequently in the evenings, she skips meals frequently, she is frequently drinking liquids with calories, she frequently makes poor food choices, she has problems with excessive hunger, she frequently eats larger portions than normal, and she struggles with emotional eating.  Depression Screen Teresa Brennan's Food and Mood (modified PHQ-9) score was 12.  Subjective:   1. Other fatigue Teresa Brennan admits to daytime somnolence and admits to waking up still tired. Patient has a history of symptoms of daytime fatigue, morning fatigue, and morning headache. Teresa Brennan generally gets 8 hours of sleep per night, and states that she has nightime awakenings. Snoring is present. Apneic episodes are not present. [place]  Sleepiness Score is 11.   2. SOBOE (shortness of breath on exertion) Malaiah notes increasing shortness of breath with exercising and seems to be worsening over time with weight gain. She notes getting out of breath sooner with activity than she used to. This has not gotten worse recently. Teresa Brennan denies shortness of breath at rest or orthopnea.  3. Vitamin B12 deficiency Teresa Brennan is taking B12 injections.   4. Pernicious anemia Teresa Brennan is seeing Oncology.   5. Vitamin D deficiency Teresa Brennan is taking Vitamin D.   6. Family history of diabetes mellitus Teresa Brennan has a history of elevated glucose.   Assessment/Plan:   1. Other fatigue Teresa Brennan does feel that her weight is causing her energy to be lower than it should be. Fatigue may be related to obesity, depression or many other causes. Labs will be ordered, and in the meanwhile, Teresa Brennan will focus on self care including making healthy food choices, increasing physical activity and focusing on stress reduction.  - EKG 12-Lead - TSH - T4, free - T3  2. SOBOE (shortness of breath on exertion) Teresa Brennan does feel that she gets out of breath more easily that she used to when she exercises. Teresa Brennan shortness of breath appears to be obesity related and exercise induced. She has agreed to work on weight loss and gradually increase exercise to treat her exercise induced shortness of breath. Will continue to monitor closely.  - VITAMIN D 25 Hydroxy (Vit-D Deficiency, Fractures) - TSH - T4, free - T3  3. Vitamin B12 deficiency Teresa Brennan will continue with her B12 injections.   4. Pernicious anemia Teresa Brennan will continue to follow-up with Oncology.  5. Vitamin D deficiency We will check labs today, and Teresa Brennan will continue her Vitamin D supplement.   - VITAMIN D 25 Hydroxy (Vit-D Deficiency, Fractures)  6. Family history of diabetes mellitus We will check labs today, and we will follow-up at [place] next visit.   - Lipid panel - Insulin,  random - Hemoglobin A1c - Comprehensive metabolic panel  7. Depression screening Teresa Brennan had a positive depression screening. Depression is commonly associated with obesity and often results in emotional eating behaviors. We will monitor this closely and work on CBT to help improve the non-hunger eating patterns. Referral to Psychology may be required if no improvement is seen as she continues in our clinic.  8. Obesity,current BMI 34.7 Teresa Brennan is currently in the action stage of change and her goal is to continue with weight loss efforts. I recommend Teresa Brennan begin the structured treatment plan as follows:  She has agreed to the Category 2 Plan.  Meal planning and intentional eating were discussed. May have a protein shake if needed. Eating Out guide was given.   Exercise goals: No exercise has been prescribed at this time.   Behavioral modification strategies: increasing lean protein intake, decreasing simple carbohydrates, increasing vegetables, increasing water intake, decreasing eating out, no skipping meals, meal planning and cooking strategies, keeping healthy foods in the home, and planning for success.  She was informed of the importance of frequent follow-up visits to maximize her success with intensive lifestyle modifications for her multiple health conditions. She was informed we would discuss her lab results at her next visit unless there is a critical issue that needs to be addressed sooner. Teresa Brennan agreed to keep her next visit at the agreed upon time to discuss these results.  Objective:   Blood pressure 124/88, pulse 96, temperature 98.2 F (36.8 C), height '5\' 5"'$  (1.651 m), weight 208 lb (94.3 kg), SpO2 100 %. Body mass index is 34.61 kg/m.  EKG: Normal sinus rhythm, rate 93 BPM.  Indirect Calorimeter completed today shows a VO2 of 233 and a REE of 1613.  Her calculated basal metabolic rate is 3474 thus her basal metabolic rate is worse than expected.  General: Cooperative,  alert, well developed, in no acute distress. HEENT: Conjunctivae and lids unremarkable. Cardiovascular: Regular rhythm.  Lungs: Normal work of breathing. Neurologic: No focal deficits.   Lab Results  Component Value Date   CREATININE 1.01 (H) 01/19/2022   BUN 9 01/19/2022   NA 140 01/19/2022   K 4.3 01/19/2022   CL 106 01/19/2022   CO2 22 01/19/2022   Lab Results  Component Value Date   ALT 13 01/19/2022   AST 15 01/19/2022   ALKPHOS 79 01/19/2022   BILITOT 0.4 01/19/2022   Lab Results  Component Value Date   HGBA1C 5.8 (H) 01/19/2022   Lab Results  Component Value Date   INSULIN 30.7 (H) 01/19/2022   Lab Results  Component Value Date   TSH 0.764 01/19/2022   Lab Results  Component Value Date   CHOL 164 01/19/2022   HDL 39 (L) 01/19/2022   LDLCALC 111 (H) 01/19/2022   TRIG 70 01/19/2022   CHOLHDL 4.2 01/19/2022   Lab Results  Component Value Date   WBC 4.3 10/27/2021   HGB 14.0 10/27/2021   HCT 41.5 10/27/2021   MCV 80.6 10/27/2021   PLT 317 10/27/2021   Lab Results  Component Value Date   IRON 73 10/27/2021   TIBC 416 10/27/2021   FERRITIN 23 10/27/2021  Attestation Statements:   Reviewed by clinician on day of visit: allergies, medications, problem list, medical history, surgical history, family history, social history, and previous encounter notes.   Wilhemena Durie, am acting as Location manager for [employer], DO.  I have reviewed the above documentation for accuracy and completeness, and I agree with the above. Jearld Lesch, DO

## 2022-02-06 ENCOUNTER — Encounter: Payer: Self-pay | Admitting: Bariatrics

## 2022-02-08 ENCOUNTER — Encounter: Payer: Self-pay | Admitting: Bariatrics

## 2022-02-08 ENCOUNTER — Ambulatory Visit (INDEPENDENT_AMBULATORY_CARE_PROVIDER_SITE_OTHER): Payer: 59 | Admitting: Bariatrics

## 2022-02-08 VITALS — BP 120/81 | HR 102 | Temp 98.4°F | Ht 65.0 in | Wt 209.0 lb

## 2022-02-08 DIAGNOSIS — R7303 Prediabetes: Secondary | ICD-10-CM | POA: Diagnosis not present

## 2022-02-08 DIAGNOSIS — E559 Vitamin D deficiency, unspecified: Secondary | ICD-10-CM | POA: Insufficient documentation

## 2022-02-08 DIAGNOSIS — E786 Lipoprotein deficiency: Secondary | ICD-10-CM | POA: Diagnosis not present

## 2022-02-08 DIAGNOSIS — Z6834 Body mass index (BMI) 34.0-34.9, adult: Secondary | ICD-10-CM | POA: Insufficient documentation

## 2022-02-08 DIAGNOSIS — R632 Polyphagia: Secondary | ICD-10-CM | POA: Diagnosis not present

## 2022-02-08 DIAGNOSIS — E88819 Insulin resistance, unspecified: Secondary | ICD-10-CM

## 2022-02-08 DIAGNOSIS — E669 Obesity, unspecified: Secondary | ICD-10-CM

## 2022-02-08 MED ORDER — ONDANSETRON HCL 4 MG PO TABS: 4.0000 mg | ORAL_TABLET | Freq: Three times a day (TID) | ORAL | 0 refills | Status: AC | PRN

## 2022-02-08 MED ORDER — ZEPBOUND 2.5 MG/0.5ML ~~LOC~~ SOAJ: 2.5000 mg | SUBCUTANEOUS | 0 refills | Status: DC

## 2022-02-16 NOTE — Progress Notes (Signed)
Chief Complaint:   OBESITY Teresa Brennan is here to discuss her progress with her obesity treatment plan along with follow-up of her obesity related diagnoses. Belky is on the Category 2 Plan and states she is following her eating plan approximately 50% of the time. Amra states she is doing exercises at home for 30 minutes 5 times per week.  Today's visit was #: 2 Starting weight: 208 lbs Starting date: 01/19/22 Today's weight: 209 lbs Today's date: 02/08/22 Total lbs lost to date: 0 Total lbs lost since last in-office visit: +1  Interim History: She is up 1 pound since her last visit.  She has gotten back on track but more stressed at work.  She is not drinking enough water.  Subjective:   1. Prediabetes No contraindications to GLP-1.  Taking birth control pills. A1c-5.8.  2. Low HDL (under 40) HDL low at 39.  3. Insulin resistance Insulin-30.7.  4. Vitamin D insufficiency Not taking vitamin D.  Vitamin D-37.3.  5. Polyphagia No medication tried for weight.  Taking birth control pills.   Assessment/Plan:   1. Prediabetes GLP-1 sheet discussed.  2. Low HDL (under 40) 1.  Handout-Healthy versus unhealthy fats  3. Insulin resistance 1.  Handout -insulin resistance and prediabetes.  4. Vitamin D insufficiency Start back on vitamin D.  5. Polyphagia 1.  GLP-1 sheet discussed. 2.  Discussed cravings (emotional eating). 3.  New prescription: - tirzepatide (ZEPBOUND) 2.5 MG/0.5ML Pen; Inject 2.5 mg into the skin once a week.  Dispense: 2 mL; Refill: 0 4.  New prescription: - ondansetron (ZOFRAN) 4 MG tablet; Take 1 tablet (4 mg total) by mouth every 8 (eight) hours as needed for nausea or vomiting.  Dispense: 20 tablet; Refill: 0  6. Obesity,current BMI 34.8 1.  Will adhere to the plan 80-90%. 2.  Meal planning 3.  Reviewed labs 01/19/2022: CMP, lipids, vitamin D, A1c, insulin and thyroid panel.  Stamatia is currently in the action stage of change. As such, her  goal is to continue with weight loss efforts. She has agreed to the Category 2 Plan.   Exercise goals: as is  Behavioral modification strategies: increasing lean protein intake, decreasing simple carbohydrates, increasing vegetables, increasing water intake, decreasing eating out, no skipping meals, meal planning and cooking strategies, keeping healthy foods in the home, and planning for success.  Gabryela has agreed to follow-up with our clinic in 2 weeks with [facility]. She was informed of the importance of frequent follow-up visits to maximize her success with intensive lifestyle modifications for her multiple health conditions.    Objective:   Blood pressure 120/81, pulse (!) 102, temperature 98.4 F (36.9 C), height '5\' 5"'$  (1.651 m), weight 209 lb (94.8 kg), SpO2 99 %. Body mass index is 34.78 kg/m.  General: Cooperative, alert, well developed, in no acute distress. HEENT: Conjunctivae and lids unremarkable. Cardiovascular: Regular rhythm.  Lungs: Normal work of breathing. Neurologic: No focal deficits.   Lab Results  Component Value Date   CREATININE 1.01 (H) 01/19/2022   BUN 9 01/19/2022   NA 140 01/19/2022   K 4.3 01/19/2022   CL 106 01/19/2022   CO2 22 01/19/2022   Lab Results  Component Value Date   ALT 13 01/19/2022   AST 15 01/19/2022   ALKPHOS 79 01/19/2022   BILITOT 0.4 01/19/2022   Lab Results  Component Value Date   HGBA1C 5.8 (H) 01/19/2022   Lab Results  Component Value Date   INSULIN 30.7 (H) 01/19/2022  Lab Results  Component Value Date   TSH 0.764 01/19/2022   Lab Results  Component Value Date   CHOL 164 01/19/2022   HDL 39 (L) 01/19/2022   LDLCALC 111 (H) 01/19/2022   TRIG 70 01/19/2022   CHOLHDL 4.2 01/19/2022   Lab Results  Component Value Date   VD25OH 37.3 01/19/2022   VD25OH 38.62 06/12/2018   Lab Results  Component Value Date   WBC 4.3 10/27/2021   HGB 14.0 10/27/2021   HCT 41.5 10/27/2021   MCV 80.6 10/27/2021   PLT 317  10/27/2021   Lab Results  Component Value Date   IRON 73 10/27/2021   TIBC 416 10/27/2021   FERRITIN 23 10/27/2021    Attestation Statements:   Reviewed by clinician on day of visit: allergies, medications, problem list, medical history, surgical history, family history, social history, and previous encounter notes.  I, Dawn Whitmire, FNP-C, am acting as transcriptionist for Dr. Jearld Lesch.  I have reviewed the above documentation for accuracy and completeness, and I agree with the above. Jearld Lesch, DO

## 2022-02-20 ENCOUNTER — Encounter: Payer: Self-pay | Admitting: Bariatrics

## 2022-02-23 ENCOUNTER — Encounter: Payer: Self-pay | Admitting: Nurse Practitioner

## 2022-02-23 ENCOUNTER — Ambulatory Visit (INDEPENDENT_AMBULATORY_CARE_PROVIDER_SITE_OTHER): Payer: 59 | Admitting: Nurse Practitioner

## 2022-02-23 VITALS — BP 117/84 | HR 110 | Temp 98.4°F | Ht 65.0 in | Wt 209.0 lb

## 2022-02-23 DIAGNOSIS — Z6834 Body mass index (BMI) 34.0-34.9, adult: Secondary | ICD-10-CM

## 2022-02-23 DIAGNOSIS — R7303 Prediabetes: Secondary | ICD-10-CM

## 2022-02-23 DIAGNOSIS — E669 Obesity, unspecified: Secondary | ICD-10-CM

## 2022-02-23 MED ORDER — INSULIN PEN NEEDLE 31G X 5 MM MISC: 0 refills | Status: AC

## 2022-02-23 MED ORDER — SAXENDA 18 MG/3ML ~~LOC~~ SOPN: 3.0000 mg | PEN_INJECTOR | Freq: Every day | SUBCUTANEOUS | 0 refills | Status: AC

## 2022-02-23 NOTE — Patient Instructions (Addendum)
What is a GLP-1 Glucagon like peptide-1 (GLP-1) agonists represent a class of medications used to treat type 2 diabetes mellitus and obesity.  GLP-1 medications mimic the action of a hormone called glucagon like peptide 1.  When blood sugar levels start to rise/increase these drugs stimulate the body to produce more insulin.  When that happens, the extra insulin helps to lower the blood sugar levels in the body.  This in returns helps with decreasing cravings.  These medications also slow the movement of food from the stomach into the small intestine.  This in return helps one to full faster and longer.   Diabetic medications: Approved for treatment of diabetes mellitus but does not have full approval for weight loss use Victoza (liraglutide) Ozempic (semaglutide) Mounjaro Trulicity Rybelsus  Weight loss medications: Approved for long-term weight loss use.        Saxenda (liraglutide) Wegovy (semaglutide) Zepbound Contraindications: Pancreatitis (active gallstones) Medullary thyroid cancer High triglycerides (>500)-will need labs prior to starting Multiple Endocrine Neoplasia syndrome type 2 (MEN 2) Trying to get pregnant Breastfeeding Use with caution with taking insulin or sulfonylureas (will need to monitor blood sugars for hypoglycemia) Side effects (most common): Most common side effects are nausea, gas, bloating and constipation.  Other possible side effects are headaches, belching, diarrhea, tiredness (fatigue), vomiting, upset stomach, dizziness, heartburn and stomach (abdominal pain).  If you think that you are becoming dehydrated, please inform our office or your primary family provider.  Stop immediately and go to ER if you have any symptoms of a serious allergic reaction including swelling of your face, lips, tongue or throat; problems breathing or swallowing; severe rash or itching; fainting or feeling dizzy; or very rapid heart rate.                                                                                             Start Saxenda Injection daily per prescription directions.   Start at 0.'6mg'$  daily X 1 week and then can increase to 1.'2mg'$  after 7 days if you are not having any side effects.    The office staff will send a prior authorization request to your insurance company for approval. We will send you a mychart message once we hear back from your insurance with a decision.  This can take up to 7-10 business days.    What should I tell my provider before using Saxenda? Tell your provider is you have or had: pancreatitis, stones in your gallbladder (gallstones), medullary thyroid cancer and a history of alcoholism or high blood triglyceride levels.   What should I tell my provider before using Saxenda? If you or any of you family members have a history of thyroid cancer You have Multiple Endocrine Neoplasia Syndrome Type 2 (MEN2). This is a disease where people have tumors in more than one gland in their body You are allergic to Liraglutide or any of the ingredients in Saxenda Have/had kidney or liver problems Have/had depression or suicidal thoughts Are pregnant or planning to become pregnant  What is [place] and how does it work? Kirke Shaggy is an injectable prescription medication prescribed by your provider  to help with your weight loss.  This medicine will be most effective when combined with a reduced calorie diet and physical activity.  Kirke Shaggy is not for the treatment of type 2 diabetes mellitus. Kirke Shaggy should not be used with other GLP-1 receptor agonist medicines. Saxenda and insulin should not be used together. Kirke Shaggy acts like a hormone made in the intestines called GLP-1 (glucagon-like peptide).  One role of GLP-1 is to send a signal to your brain to tell it you are full. It also slows down stomach emptying which will make you feel full longer and may help with cravings.   How should I take Saxenda? Use exactly as your provider has  instructed you. Your dose should be increased weekly until you reach the '3mg'$  dose. After that, do not change your dose unless your provider tells you to.  Kirke Shaggy is injected 1 time each day, at any time during the day but at the same time daily.  You can take Saxenda, with or without food. You will be taught at our office how to inject Saxenda before you use it for the first time. If you have questions or do not understand the instructions, talk to [place] before leaving the office.   Inject your dose of Saxenda under the skin (subcutaneous injection) in your stomach area (abdomen), upper leg (thigh) or upper arm. Do not inject into a vein or a muscle. The injection site should be rotated and not given in the same spot each day. Hold the needle under the skin and count to "10". This will allow all of the medicine to be dispensed under the skin.  If you miss your daily dose of Saxenda, use it as soon as you remember. Then take your next daily dose as usual on the following day. Do not take an extra dose of Saxenda, or increase your dose on the following day to make up for your missed dose. If you miss your dose of Saxenda for 3 days or more call our office to talk about how to restart your treatment.   Kirke Shaggy comes in a prefilled pen. Pen needles are not included. Your will receive a prescription for pen needles.  Always wipe your skin with an alcohol prep pad before injection. Always use a new needle for each injection. This will prevent contamination, infections, leakage, and blocked needles leading to the wrong dose.  Never share your Saxenda pen or needles with another person. You may give them an infection, or get an infection from them. Dispose of used needles in an approved sharps container. More practical options that can be put in the trash to go to the landfill are milk jugs or plastic laundry detergent containers with a screw on lid.   What side effects may I notice from taking  Saxenda?   Side effects that usually do not require medical attention (report to our office if they continue or are bothersome): Nausea (most common but decreases over time in most people as their body gets used to the medicine) Diarrhea Constipation (you may take an over the counter laxative if needed) Headache Decreased appetite Upset stomach Tiredness Dizziness Side effects that you should report to our office as soon as possible: Vomiting Pain in your upper stomach Fever Yellowing of your skin or eyes  Clay-colored stools Increased heart rate while at rest Low blood sugar  Sudden changes in mood, behaviors, thoughts, feelings, or thoughts of suicide If you get a lump or swelling in your neck, hoarseness,  trouble swallowing, or shortness of breath. Allergic reaction such as skin rash, itching, hives, swelling of the face, tongue, or lips  Other important information Store your new/unused pens in the refrigerator until first use then the pen can be stored in or out of the refrigerator for up to 30 days.  When starting a new prefilled pen, you must follow the "Check the Saxenda flow with each new pen" instructions. You only need to do this 1 time with each new pen. You should also do this if you drop your pen. If you check the flow more than once, you will run out of medication too soon.  If your pen appears defective and won't prime as above, return it to your pharmacy.  Refills will require an office visit.  SaxendaCareT is a 16-week step-by-step support program designed to help patients with getting off to a good start of [place] and to help you build skills to make healthier choices. Log on to Saxenda.com for more information.

## 2022-03-02 NOTE — Progress Notes (Signed)
Chief Complaint:   OBESITY Teresa Brennan is here to discuss her progress with her obesity treatment plan along with follow-up of her obesity related diagnoses. Teresa Brennan is on the Category 2 Plan and states she is following her eating plan approximately 20% of the time. Teresa Brennan states she is exercising 0 minutes 0 times per week.  Today's visit was #: 3 Starting weight: 208 lbs Starting date: 01/19/2022 Today's weight: 209 lbs Today's date: 03/01/2022 Total lbs lost to date: 0 lbs Total lbs lost since last in-office visit: 0  Interim History: Teresa Brennan is under stress at work and has been stress eating.  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  Was unable to start Zepbound due to cost.  Subjective:   1. Prediabetes Not currently on medication.  Last A1c was 5.8, insulin was 30.7.  Struggling with cravings.  Assessment/Plan:   1. Prediabetes Consider Metformin at next visit if unable to start Saxenda.  Teresa Brennan will continue to work on weight loss, exercise, and decreasing simple carbohydrates to help decrease the risk of diabetes.    2. Generalized obesity Options discussed today:  Start Saxenda 0.6 mg daily for 1 week then can increase to 1.2 mg if no side effects.  2. Consider Wellbutrin for stress/emotional eating and cravings.   3. Consider Metformin for Prediabetes and IR.   -Start  Liraglutide -Weight Management (SAXENDA) 18 MG/3ML SOPN; Inject 3 mg into the skin daily.  Dispense: 15 mL; Refill: 0  -Refill Insulin Pen Needle 31G X 5 MM MISC; Use as directed with Saxenda  Dispense: 100 each; Refill: 0  Contraindications: patient denies Pancreatitis (active gallstones) Medullary thyroid cancer High triglycerides (>500)-will need labs prior to starting Multiple Endocrine Neoplasia syndrome type 2 (MEN 2) Trying to get pregnant Breastfeeding Use with caution with taking insulin or sulfonylureas (will need to monitor blood sugars for hypoglycemia)  3. BMI  34.0-34.9,adult Teresa Brennan is currently in the action stage of change. As such, her goal is to continue with weight loss efforts. She has agreed to the Category 2 Plan.   Exercise goals: All adults should avoid inactivity. Some physical activity is better than none, and adults who participate in any amount of physical activity gain some health benefits.  Behavioral modification strategies: increasing lean protein intake, increasing vegetables, increasing water intake, and planning for success.  Teresa Brennan has agreed to follow-up with our clinic in 2 weeks. She was informed of the importance of frequent follow-up visits to maximize her success with intensive lifestyle modifications for her multiple health conditions.   Objective:   Blood pressure 117/84, pulse (!) 110, temperature 98.4 F (36.9 C), height 5' 5"$  (1.651 m), weight 209 lb (94.8 kg), SpO2 100 %. Body mass index is 34.78 kg/m.  General: Cooperative, alert, well developed, in no acute distress. HEENT: Conjunctivae and lids unremarkable. Cardiovascular: Regular rhythm.  Lungs: Normal work of breathing. Neurologic: No focal deficits.   Lab Results  Component Value Date   CREATININE 1.01 (H) 01/19/2022   BUN 9 01/19/2022   NA 140 01/19/2022   K 4.3 01/19/2022   CL 106 01/19/2022   CO2 22 01/19/2022   Lab Results  Component Value Date   ALT 13 01/19/2022   AST 15 01/19/2022   ALKPHOS 79 01/19/2022   BILITOT 0.4 01/19/2022   Lab Results  Component Value Date   HGBA1C 5.8 (H) 01/19/2022   Lab Results  Component Value Date   INSULIN 30.7 (H) 01/19/2022  Lab Results  Component Value Date   TSH 0.764 01/19/2022   Lab Results  Component Value Date   CHOL 164 01/19/2022   HDL 39 (L) 01/19/2022   LDLCALC 111 (H) 01/19/2022   TRIG 70 01/19/2022   CHOLHDL 4.2 01/19/2022   Lab Results  Component Value Date   VD25OH 37.3 01/19/2022   VD25OH 38.62 06/12/2018   Lab Results  Component Value Date   WBC 4.3 10/27/2021    HGB 14.0 10/27/2021   HCT 41.5 10/27/2021   MCV 80.6 10/27/2021   PLT 317 10/27/2021   Lab Results  Component Value Date   IRON 73 10/27/2021   TIBC 416 10/27/2021   FERRITIN 23 10/27/2021   Attestation Statements:   Reviewed by clinician on day of visit: allergies, medications, problem list, medical history, surgical history, family history, social history, and previous encounter notes.  I, Brendell Tyus, RMA, am acting as transcriptionist for Everardo Pacific, FNP.  I have reviewed the above documentation for accuracy and completeness, and I agree with the above. Everardo Pacific, FNP

## 2022-03-13 ENCOUNTER — Ambulatory Visit: Payer: 59 | Admitting: Family Medicine

## 2022-03-16 ENCOUNTER — Ambulatory Visit: Payer: 59 | Admitting: Nurse Practitioner

## 2022-05-01 ENCOUNTER — Ambulatory Visit
Admission: EM | Admit: 2022-05-01 | Discharge: 2022-05-01 | Disposition: A | Discharge status: Home / Self Care | Payer: 59 | Attending: Urgent Care | Admitting: Urgent Care

## 2022-05-01 DIAGNOSIS — N3001 Acute cystitis with hematuria: Secondary | ICD-10-CM | POA: Diagnosis not present

## 2022-05-01 DIAGNOSIS — Z7251 High risk heterosexual behavior: Secondary | ICD-10-CM | POA: Insufficient documentation

## 2022-05-01 DIAGNOSIS — R300 Dysuria: Secondary | ICD-10-CM | POA: Insufficient documentation

## 2022-05-01 LAB — POCT URINALYSIS DIP (MANUAL ENTRY)
Bilirubin, UA: NEGATIVE
Glucose, UA: NEGATIVE mg/dL
Leukocytes, UA: NEGATIVE
Nitrite, UA: NEGATIVE
Protein Ur, POC: NEGATIVE mg/dL
Spec Grav, UA: >=1.03 — AB (ref 1.010–1.025)
Urobilinogen, UA: 0.2 E.U./dL
pH, UA: 6 (ref 5.0–8.0)

## 2022-05-01 LAB — POCT URINE PREGNANCY: Preg Test, Ur: NEGATIVE

## 2022-05-01 MED ORDER — PHENAZOPYRIDINE HCL 200 MG PO TABS: 200.0000 mg | ORAL_TABLET | Freq: Three times a day (TID) | ORAL | 0 refills | Status: AC | PRN

## 2022-05-01 MED ORDER — CEPHALEXIN 500 MG PO CAPS: 500.0000 mg | ORAL_CAPSULE | Freq: Two times a day (BID) | ORAL | 0 refills | Status: AC

## 2022-05-01 NOTE — ED Provider Notes (Addendum)
[facility]  Note:  This document was prepared using Conservation officer, historic buildings and may include unintentional dictation errors.  MRN: [id number] DOB: [birth year]  Subjective:   Teresa Brennan is a 37 y.o. female presenting for 1 day history of dysuria, hematuria, vaginal discharge that is clear.  Patient took 1 Cystex.  Would like to be checked for sexually transmitted infections, has unprotected sex with 1 female partner.  Has a history of UTIs, BV infections, yeast infections.  However would like to be covered for urinary tract infection today. Denies fever, n/v, abdominal pain, pelvic pain, rashes.  Patient does not hydrate very well and she also drinks urinary irritants.  Does a lot of coffee but can do sodas as well.  No current facility-administered medications for this encounter.  Current Outpatient Medications:    levonorgestrel-ethinyl estradiol (ALESSE) 0.1-20 MG-MCG tablet, Take 1 tablet by mouth daily., Disp: , Rfl:    cetirizine (ZYRTEC) 10 MG tablet, Take 1 tablet (10 mg total) by mouth at bedtime., Disp: 90 tablet, Rfl: 3   cyanocobalamin (VITAMIN B12) 1000 MCG/ML injection, INJECT 1 ML INTO THE MUSCLE ONCE A WEEK, Disp: 12 mL, Rfl: 4   DAYSEE 0.15-0.03 &0.01 MG tablet, , Disp: , Rfl: 4   DENTAGEL 1.1 % GEL dental gel, , Disp: , Rfl:    Insulin Pen Needle 31G X 5 MM MISC, Use as directed with Saxenda, Disp: 100 each, Rfl: 0   Liraglutide -Weight Management (SAXENDA) 18 MG/3ML SOPN, Inject 3 mg into the skin daily., Disp: 15 mL, Rfl: 0   Needles & Syringes MISC, 1 Syringe by Does not apply route once a week., Disp: 30 each, Rfl: 3   ondansetron (ZOFRAN) 4 MG tablet, Take 1 tablet (4 mg total) by mouth every 8 (eight) hours as needed for nausea or vomiting., Disp: 20 tablet, Rfl: 0   sertraline (ZOLOFT) 50 MG tablet, Take 1 tablet (50 mg total) by mouth daily., Disp: 30 tablet, Rfl: 3   valACYclovir (VALTREX) 500 MG tablet, Take 1 tablet (500 mg total)  by mouth 2 (two) times daily as needed for 3 days, Disp: 30 tablet, Rfl: 1   VITAMIN D PO, Take 2,000 Int'l Units by mouth., Disp: , Rfl:    No Known Allergies  Past Medical History:  Diagnosis Date   Anemia    Anxiety    Asthma    B12 deficiency    Dyspareunia, female    Fibroid    Headache(784.0)    History of fatigue 03/10/2011   Irregular menses    Migraine    Ovarian tumor    Painful intercourse 11/15/2010   SOB (shortness of breath)    Vitamin D deficiency      Past Surgical History:  Procedure Laterality Date   CESAREAN SECTION N/A 07/18/2013   Procedure: CESAREAN SECTION;  Surgeon: Kirkland Hun, MD;  Location: [facility];  Service: Obstetrics;  Laterality: N/A;   HYSTEROSCOPY      Family History  Adopted: Yes  Problem Relation Age of Onset   Sickle cell trait Daughter     Social History   Tobacco Use   Smoking status: Never   Smokeless tobacco: Never  Vaping Use   Vaping Use: Never used  Substance Use Topics   Alcohol use: No    Alcohol/week: 0.0 standard drinks of alcohol   Drug use: No    ROS   Objective:   Vitals: BP (!) 134/91 (BP Location: Left Arm)  Pulse (!) 104   Temp 98.4 F (36.9 C) (Oral)   Resp 18   LMP  (LMP Unknown)   SpO2 98%   Physical Exam Constitutional:      General: She is not in acute distress.    Appearance: Normal appearance. She is well-developed. She is not ill-appearing, toxic-appearing or diaphoretic.  HENT:     Head: Normocephalic and atraumatic.     Nose: Nose normal.     Mouth/Throat:     Mouth: Mucous membranes are moist.     Pharynx: Oropharynx is clear.  Eyes:     General: No scleral icterus.       Right eye: No discharge.        Left eye: No discharge.     Extraocular Movements: Extraocular movements intact.     Conjunctiva/sclera: Conjunctivae normal.  Cardiovascular:     Rate and Rhythm: Normal rate.  Pulmonary:     Effort: Pulmonary effort is normal.  Abdominal:     General: Bowel sounds  are normal. There is no distension.     Palpations: Abdomen is soft. There is no mass.     Tenderness: There is no abdominal tenderness. There is no right CVA tenderness, left CVA tenderness, guarding or rebound.  Skin:    General: Skin is warm and dry.  Neurological:     General: No focal deficit present.     Mental Status: She is alert and oriented to person, place, and time.  Psychiatric:        Mood and Affect: Mood normal.        Behavior: Behavior normal.        Thought Content: Thought content normal.        Judgment: Judgment normal.    Results for orders placed or performed during the hospital encounter of 05/01/22 (from the past 24 hour(s))  POCT urinalysis dipstick     Status: Abnormal   Collection Time: 05/01/22 12:26 PM  Result Value Ref Range   Color, UA yellow yellow   Clarity, UA clear clear   Glucose, UA negative negative mg/dL   Bilirubin, UA negative negative   Ketones, POC UA trace (5) (A) negative mg/dL   Spec Grav, UA >=5.697 (A) 1.010 - 1.025   Blood, UA moderate (A) negative   pH, UA 6.0 5.0 - 8.0   Protein Ur, POC negative negative mg/dL   Urobilinogen, UA 0.2 0.2 or 1.0 E.U./dL   Nitrite, UA Negative Negative   Leukocytes, UA Negative Negative  POCT urine pregnancy     Status: None   Collection Time: 05/01/22 12:26 PM  Result Value Ref Range   Preg Test, Ur Negative Negative    Assessment and Plan :   PDMP not reviewed this encounter.  1. Acute cystitis with hematuria   2. Dysuria   3. Unprotected sex     Start Keflex to cover for acute cystitis, urine culture and STI testing pending.  Recommended aggressive hydration, limiting urinary irritants. Counseled patient on potential for adverse effects with medications prescribed/recommended today, ER and return-to-clinic precautions discussed, patient verbalized understanding.   Wallis Bamberg, [place] 05/01/22 1419

## 2022-05-01 NOTE — ED Triage Notes (Addendum)
Pt c/o dysuria that began last night after intercourse. Also noted some pink-tinged urine while wiping, as well as clear odorless discharge. Pt took Cystex.

## 2022-05-01 NOTE — Discharge Instructions (Addendum)
Please start Keflex to address an urinary tract infection. Make sure you hydrate very well with plain water and a quantity of 80 ounces of water a day.  Please limit drinks that are considered urinary irritants such as soda, sweet tea, coffee, energy drinks, alcohol.  These can worsen your urinary and genital symptoms but also be the source of them.  I will let you know about your urine culture and vaginal swab results through MyChart to see if we need to prescribe or change your antibiotics based off of those results.  

## 2022-05-02 ENCOUNTER — Ambulatory Visit: Payer: 59

## 2022-05-02 LAB — CERVICOVAGINAL ANCILLARY ONLY
Bacterial Vaginitis (gardnerella): POSITIVE — AB
Candida Glabrata: NEGATIVE
Candida Vaginitis: POSITIVE — AB
Chlamydia: NEGATIVE
Comment: NEGATIVE
Comment: NEGATIVE
Comment: NEGATIVE
Comment: NEGATIVE
Comment: NEGATIVE
Comment: NORMAL
Neisseria Gonorrhea: NEGATIVE
Trichomonas: NEGATIVE

## 2022-05-03 ENCOUNTER — Telehealth: Payer: Self-pay | Admitting: Emergency Medicine

## 2022-05-03 LAB — URINE CULTURE: Culture: NO GROWTH

## 2022-05-03 MED ORDER — FLUCONAZOLE 150 MG PO TABS: 150.0000 mg | ORAL_TABLET | Freq: Once | ORAL | 0 refills | Status: AC

## 2022-05-03 MED ORDER — METRONIDAZOLE 500 MG PO TABS: 500.0000 mg | ORAL_TABLET | Freq: Two times a day (BID) | ORAL | 0 refills | Status: DC

## 2022-05-05 ENCOUNTER — Telehealth (HOSPITAL_COMMUNITY): Payer: Self-pay | Admitting: Emergency Medicine

## 2022-05-05 MED ORDER — TINIDAZOLE 500 MG PO TABS: 500.0000 mg | ORAL_TABLET | Freq: Two times a day (BID) | ORAL | 0 refills | Status: AC

## 2022-05-05 NOTE — Telephone Encounter (Signed)
Patient requested Tinidazole for treatment of recent positive BV.  Reviewed with Urban Gibson, APP, and okay'd Reviewed with patient, verified pharmacy, prescription sent

## 2022-10-25 IMAGING — DX DG CHEST 2V
2 series · 2 of 2 positions shown · non-contrast
Comparison: 05/08/2014

CLINICAL DATA: Cough for 2 months.  Asthma.

EXAM:
CHEST - 2 VIEW

[chest pa]
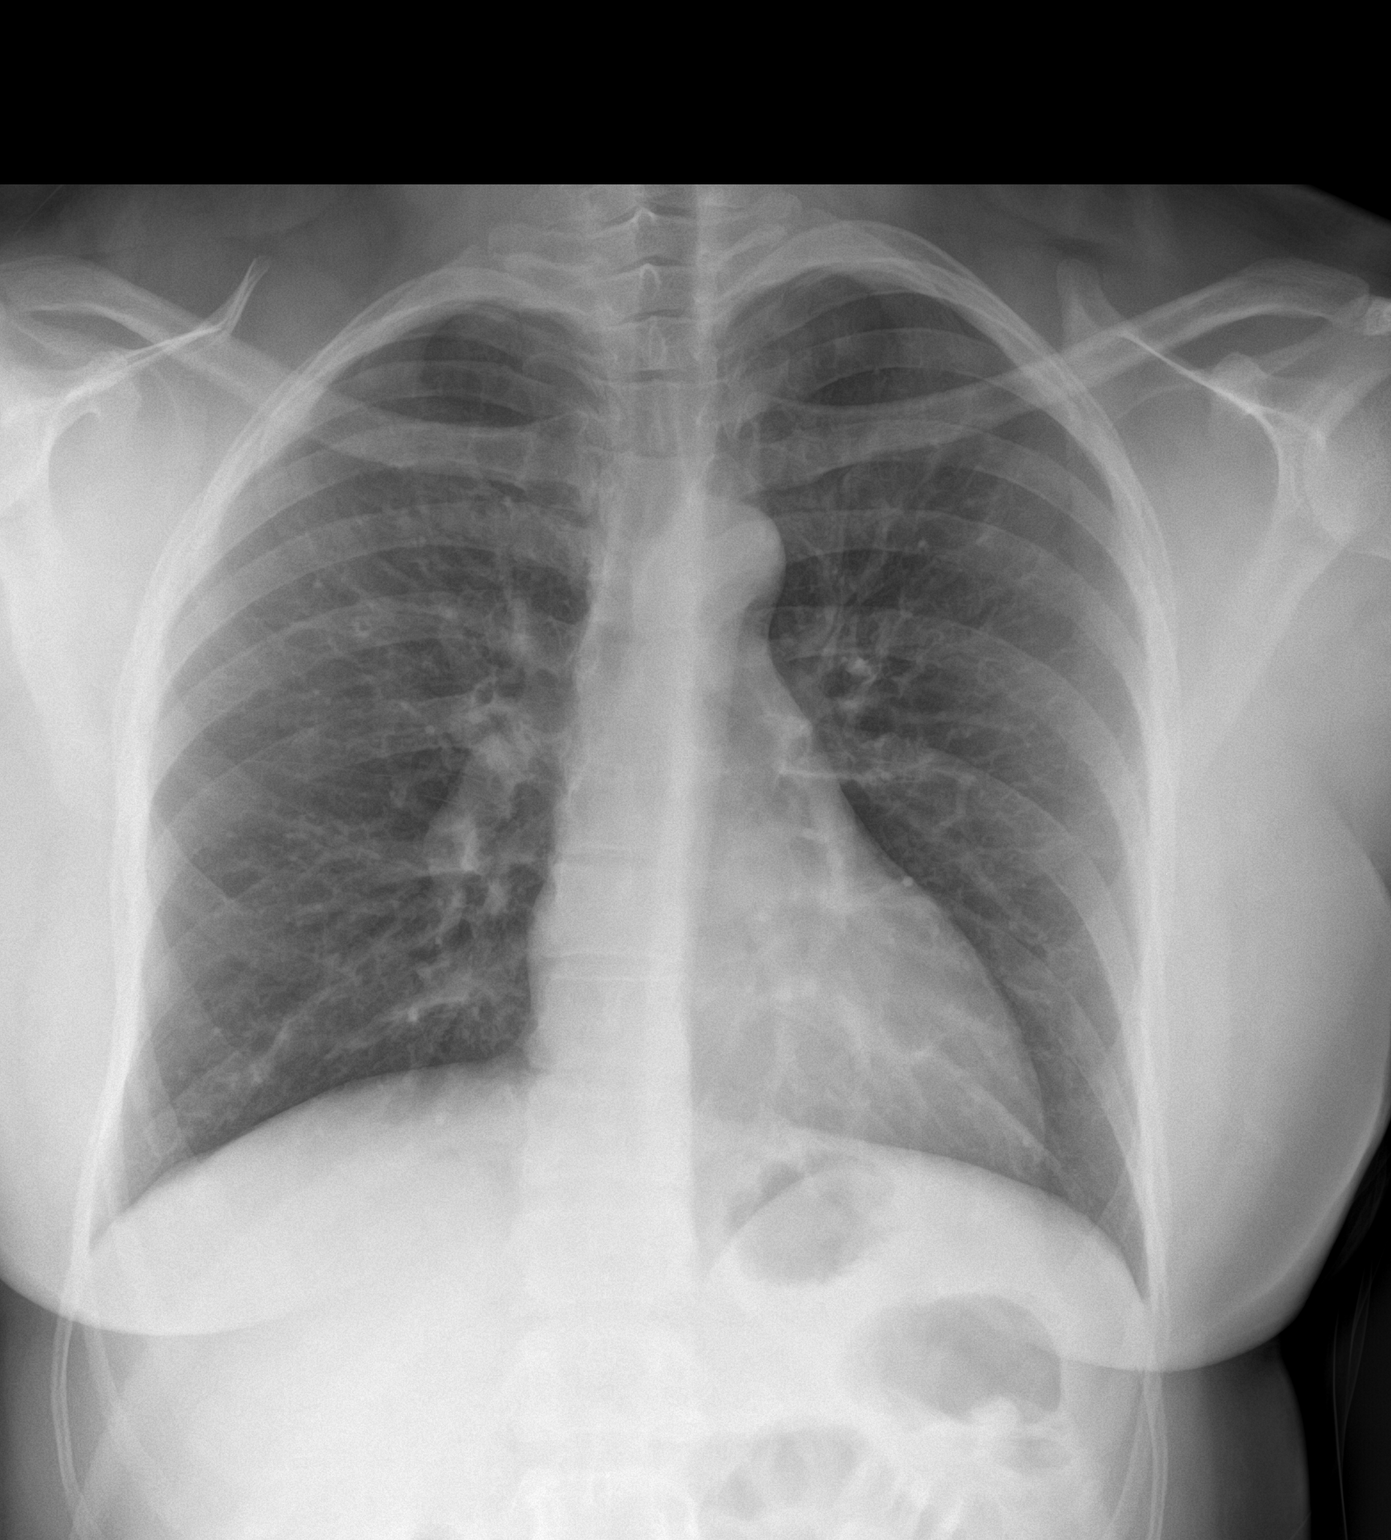

[chest lat]
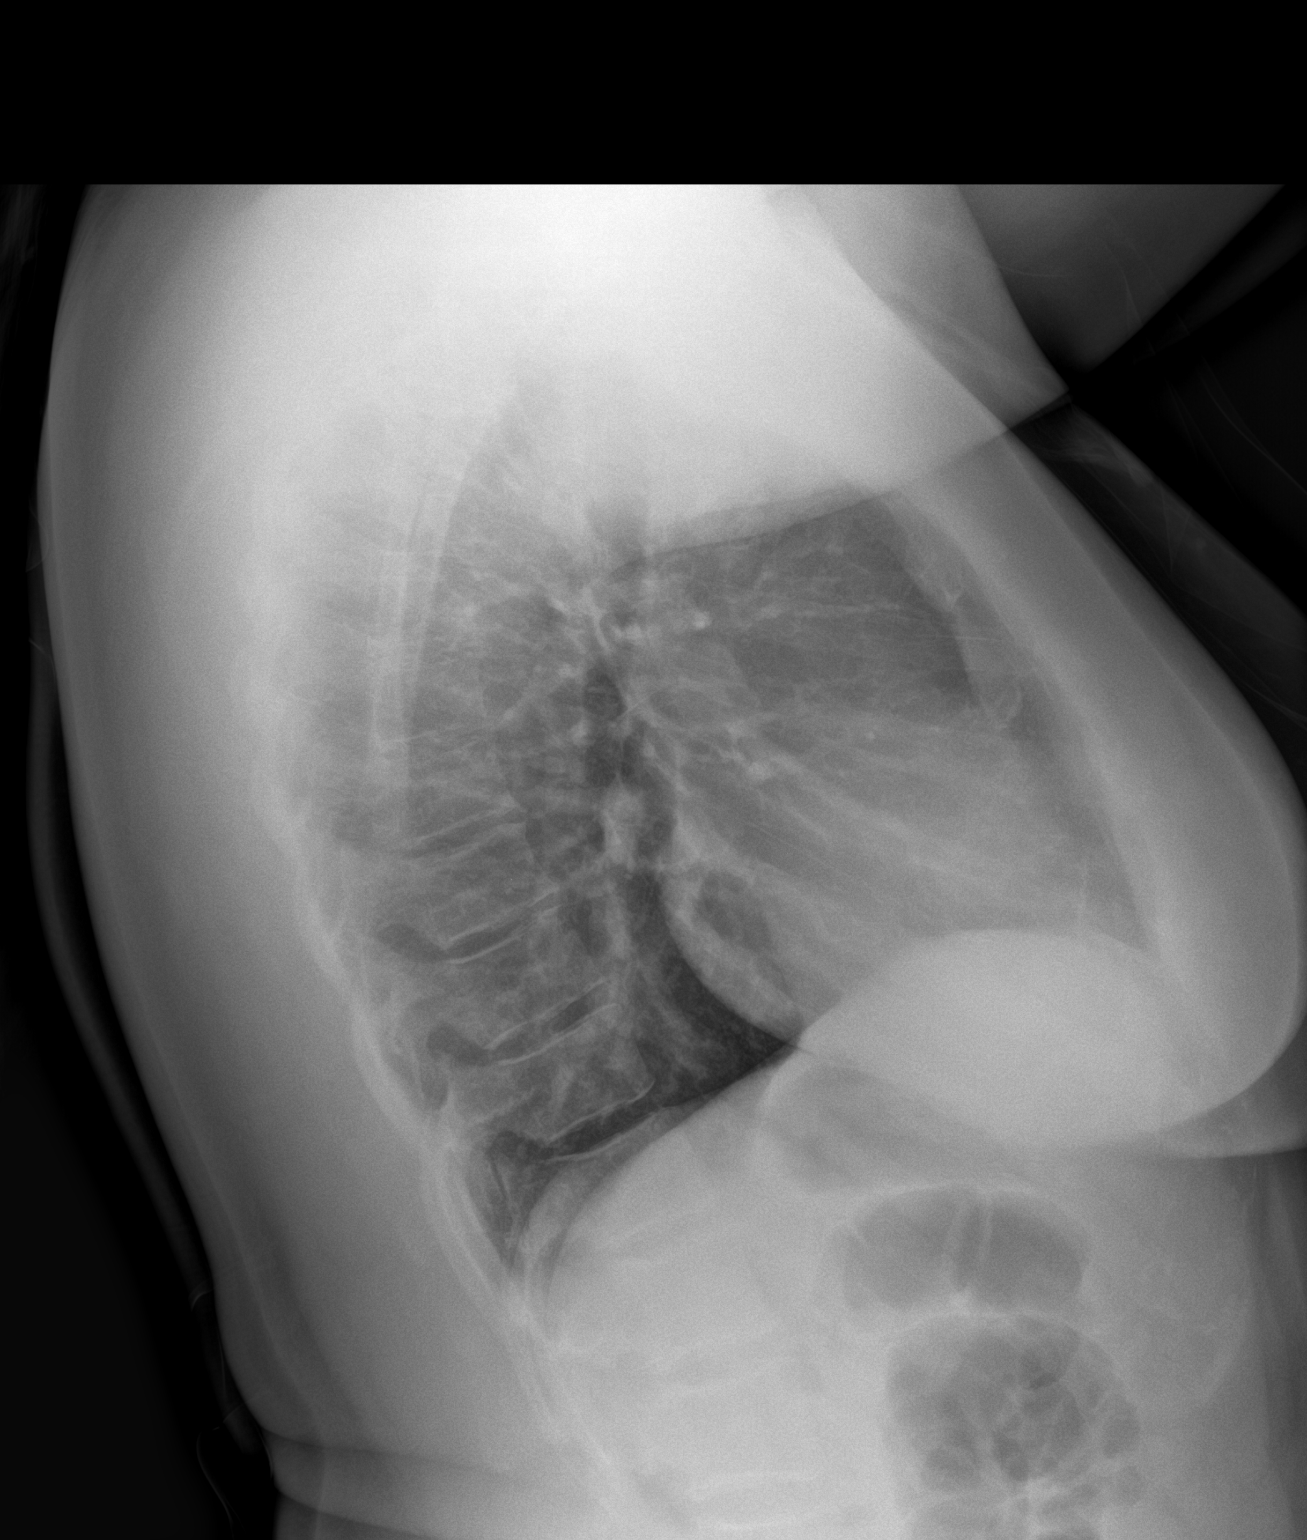

[2 of 2 positions shown; findings below may reference images not displayed]

FINDINGS: The heart size and mediastinal contours are within normal limits.
Both lungs are clear. The visualized skeletal structures are
unremarkable.
IMPRESSION: No active cardiopulmonary disease.

## 2022-10-30 ENCOUNTER — Inpatient Hospital Stay: Payer: 59 | Attending: Hematology and Oncology

## 2022-11-01 ENCOUNTER — Inpatient Hospital Stay: Payer: 59 | Admitting: Hematology and Oncology

## 2022-11-01 NOTE — Assessment & Plan Note (Deleted)
Lab review 12/24/2017: Hemoglobin 13.9, MCV 107.4, vitamin B12 39 Ferritin 247, iron saturation 50% 10/27/2021: Hemoglobin 14, B12 397, ferritin 23, iron saturation 18%   Causes of elevated ferritin: Inflammation versus iron overload The lab test did not make sense. Repeat Testing: Normal hemochromatosis gene testing: Neg       Severe vitamin B12 deficiency: Previously diagnosed as pernicious anemia. Currently doing B12 self injections.   Return to clinic in 1 year for labs and follow-up

## 2022-11-07 ENCOUNTER — Telehealth: Payer: Self-pay | Admitting: Hematology and Oncology

## 2022-11-07 NOTE — Telephone Encounter (Signed)
Patient is aware of scheduled appointment times/dates

## 2022-12-07 ENCOUNTER — Other Ambulatory Visit: Payer: Self-pay | Admitting: *Deleted

## 2022-12-07 DIAGNOSIS — E538 Deficiency of other specified B group vitamins: Secondary | ICD-10-CM

## 2022-12-08 ENCOUNTER — Inpatient Hospital Stay: Payer: 59 | Attending: Hematology and Oncology

## 2022-12-08 ENCOUNTER — Encounter: Payer: Self-pay | Admitting: Internal Medicine

## 2022-12-08 DIAGNOSIS — D510 Vitamin B12 deficiency anemia due to intrinsic factor deficiency: Secondary | ICD-10-CM | POA: Diagnosis present

## 2022-12-08 DIAGNOSIS — E538 Deficiency of other specified B group vitamins: Secondary | ICD-10-CM

## 2022-12-08 LAB — CBC WITH DIFFERENTIAL (CANCER CENTER ONLY)
Abs Immature Granulocytes: 0 10*3/uL (ref 0.00–0.07)
Basophils Absolute: 0 10*3/uL (ref 0.0–0.1)
Basophils Relative: 1 %
Eosinophils Absolute: 0.1 10*3/uL (ref 0.0–0.5)
Eosinophils Relative: 2 %
HCT: 42.9 % (ref 36.0–46.0)
Hemoglobin: 14.1 g/dL (ref 12.0–15.0)
Immature Granulocytes: 0 %
Lymphocytes Relative: 36 %
Lymphs Abs: 1.7 10*3/uL (ref 0.7–4.0)
MCH: 27.8 pg (ref 26.0–34.0)
MCHC: 32.9 g/dL (ref 30.0–36.0)
MCV: 84.4 fL (ref 80.0–100.0)
Monocytes Absolute: 0.3 10*3/uL (ref 0.1–1.0)
Monocytes Relative: 7 %
Neutro Abs: 2.6 10*3/uL (ref 1.7–7.7)
Neutrophils Relative %: 54 %
Platelet Count: 329 10*3/uL (ref 150–400)
RBC: 5.08 MIL/uL (ref 3.87–5.11)
RDW: 12.1 % (ref 11.5–15.5)
WBC Count: 4.8 10*3/uL (ref 4.0–10.5)
nRBC: 0 % (ref 0.0–0.2)

## 2022-12-08 LAB — IRON AND IRON BINDING CAPACITY (CC-WL,HP ONLY)
Iron: 42 ug/dL (ref 28–170)
Saturation Ratios: 10 % — ABNORMAL LOW (ref 10.4–31.8)
TIBC: 421 ug/dL (ref 250–450)
UIBC: 379 ug/dL (ref 148–442)

## 2022-12-08 LAB — FERRITIN: Ferritin: 34 ng/mL (ref 11–307)

## 2022-12-08 LAB — VITAMIN B12: Vitamin B-12: 726 pg/mL (ref 180–914)

## 2022-12-11 NOTE — Assessment & Plan Note (Signed)
Lab review 12/24/2017: Hemoglobin 13.9, MCV 107.4, vitamin B12 39 Ferritin 247, iron saturation 50% 10/27/2021: Hemoglobin 14, B12 397, ferritin 23, iron saturation 18%   Causes of elevated ferritin:hemochromatosis gene testing: Neg      Severe vitamin B12 deficiency: Previously diagnosed as pernicious anemia. Currently doing B12 self injections.   Return to clinic in 1 year for labs and follow-up

## 2022-12-12 ENCOUNTER — Inpatient Hospital Stay (HOSPITAL_BASED_OUTPATIENT_CLINIC_OR_DEPARTMENT_OTHER): Payer: 59 | Admitting: Hematology and Oncology

## 2022-12-12 DIAGNOSIS — D510 Vitamin B12 deficiency anemia due to intrinsic factor deficiency: Secondary | ICD-10-CM | POA: Diagnosis not present

## 2022-12-12 MED ORDER — CYANOCOBALAMIN 1000 MCG/ML IJ SOLN: INTRAMUSCULAR | 4 refills | Status: DC

## 2022-12-12 MED ORDER — NEEDLES & SYRINGES MISC: 1.0000 | 3 refills | Status: AC

## 2022-12-12 NOTE — Progress Notes (Signed)
MyChart virtual visit  Patient Care Team: Deeann Saint, MD as PCP - General (Family Medicine) Kirkland Hun, MD as Consulting Physician (Obstetrics and Gynecology) Cay Schillings, MD (Inactive) as Consulting Physician (Hematology)  DIAGNOSIS:  Encounter Diagnosis  Name Primary?   Pernicious anemia Yes      CHIEF COMPLIANT: Follow-up of B12 deficiency anemia  HISTORY OF PRESENT ILLNESS: Discussed the use of AI scribe software for clinical note transcription with the patient, who gave verbal consent to proceed.  History of Present Illness   The patient, with a history of B12 deficiency, reports good energy levels and continues to self-administer B12 injections, which she believes are beneficial. She has not noticed any weight changes recently, despite the doctor's observation that she appears thinner. She expresses a desire to lose weight but admits she is not actively facilitating this. The patient's most recent blood work showed satisfactory B12 levels, a normal hemoglobin level, and a ferritin level within the acceptable range. The patient had a previous concern about elevated iron levels, but subsequent tests have shown this to be inaccurate.         ALLERGIES:  has No Known Allergies.  MEDICATIONS:  Current Outpatient Medications  Medication Sig Dispense Refill   cephALEXin (KEFLEX) 500 MG capsule Take 1 capsule (500 mg total) by mouth 2 (two) times daily. 10 capsule 0   cetirizine (ZYRTEC) 10 MG tablet Take 1 tablet (10 mg total) by mouth at bedtime. 90 tablet 3   cyanocobalamin (VITAMIN B12) 1000 MCG/ML injection INJECT 1 ML INTO THE MUSCLE ONCE A WEEK 12 mL 4   DAYSEE 0.15-0.03 &0.01 MG tablet   4   DENTAGEL 1.1 % GEL dental gel      Insulin Pen Needle 31G X 5 MM MISC Use as directed with Saxenda 100 each 0   levonorgestrel-ethinyl estradiol (ALESSE) 0.1-20 MG-MCG tablet Take 1 tablet by mouth daily.     Liraglutide -Weight Management (SAXENDA) 18 MG/3ML SOPN  Inject 3 mg into the skin daily. 15 mL 0   Needles & Syringes MISC 1 Syringe by Does not apply route once a week. 30 each 3   ondansetron (ZOFRAN) 4 MG tablet Take 1 tablet (4 mg total) by mouth every 8 (eight) hours as needed for nausea or vomiting. 20 tablet 0   phenazopyridine (PYRIDIUM) 200 MG tablet Take 1 tablet (200 mg total) by mouth 3 (three) times daily as needed for pain. 15 tablet 0   sertraline (ZOLOFT) 50 MG tablet Take 1 tablet (50 mg total) by mouth daily. 30 tablet 3   valACYclovir (VALTREX) 500 MG tablet Take 1 tablet (500 mg total) by mouth 2 (two) times daily as needed for 3 days 30 tablet 1   VITAMIN D PO Take 2,000 Int'l Units by mouth.     No current facility-administered medications for this visit.    PHYSICAL EXAMINATION: ECOG PERFORMANCE STATUS: 1 - Symptomatic but completely ambulatory     LABORATORY DATA:  I have reviewed the data as listed    Latest Ref Rng & Units 01/19/2022    9:58 AM 06/12/2018   11:39 AM 12/24/2017    9:01 AM  CMP  Glucose 70 - 99 mg/dL 82  73  90   BUN 6 - 20 mg/dL 9  10  11    Creatinine 0.57 - 1.00 mg/dL 6.38  7.56  4.33   Sodium 134 - 144 mmol/L 140  138  138   Potassium 3.5 - 5.2 mmol/L 4.3  3.9  3.9   Chloride 96 - 106 mmol/L 106  105  105   CO2 20 - 29 mmol/L 22  25  25    Calcium 8.7 - 10.2 mg/dL 9.6  8.9  8.9   Total Protein 6.0 - 8.5 g/dL 6.8  7.2  6.9   Total Bilirubin 0.0 - 1.2 mg/dL 0.4  0.3  0.6   Alkaline Phos 44 - 121 IU/L 79  59  49   AST 0 - 40 IU/L 15  14  15    ALT 0 - 32 IU/L 13  14  16      Lab Results  Component Value Date   WBC 4.8 12/08/2022   HGB 14.1 12/08/2022   HCT 42.9 12/08/2022   MCV 84.4 12/08/2022   PLT 329 12/08/2022   NEUTROABS 2.6 12/08/2022    ASSESSMENT & PLAN:  Pernicious anemia Lab review 12/24/2017: Hemoglobin 13.9, MCV 107.4, vitamin B12 39 Ferritin 247, iron saturation 50% 10/27/2021: Hemoglobin 14, B12 397, ferritin 23, iron saturation 18%   Causes of elevated  ferritin:hemochromatosis gene testing: Neg      Severe vitamin B12 deficiency: Previously diagnosed as pernicious anemia. Currently doing B12 self injections.   Return to clinic in 1 year for labs and follow-up      No orders of the defined types were placed in this encounter.  The patient has a good understanding of the overall plan. she agrees with it. she will call with any problems that may develop before the next visit here. Total time spent: 30 mins including face to face time and time spent for planning, charting and co-ordination of care   Tamsen Meek, MD 12/12/22

## 2023-02-12 ENCOUNTER — Encounter: Payer: Self-pay | Admitting: Internal Medicine

## 2024-01-21 ENCOUNTER — Ambulatory Visit: Payer: Self-pay
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
## 2024-01-23 ENCOUNTER — Ambulatory Visit (INDEPENDENT_AMBULATORY_CARE_PROVIDER_SITE_OTHER)

## 2024-01-23 ENCOUNTER — Encounter: Payer: Self-pay | Admitting: Emergency Medicine

## 2024-01-23 ENCOUNTER — Ambulatory Visit: Admitting: Emergency Medicine

## 2024-01-23 VITALS — BP 120/80 | HR 101 | Temp 98.3°F | Ht 65.0 in | Wt 207.0 lb

## 2024-01-23 DIAGNOSIS — S39012A Strain of muscle, fascia and tendon of lower back, initial encounter: Secondary | ICD-10-CM

## 2024-01-23 MED ORDER — CYCLOBENZAPRINE HCL 10 MG PO TABS: 10.0000 mg | ORAL_TABLET | Freq: Every day | ORAL | 0 refills | Status: AC

## 2024-01-23 MED ORDER — TRAMADOL HCL 50 MG PO TABS: 50.0000 mg | ORAL_TABLET | Freq: Four times a day (QID) | ORAL | 0 refills | Status: AC | PRN

## 2024-01-23 NOTE — Assessment & Plan Note (Signed)
 Clinically stable.  No red flag signs or symptoms. Differential diagnosis discussed Recommend x-rays today.  Will review images when available Pain management discussed Recommend Flexeril 10 mg at bedtime Tylenol  for mild to moderate pain and tramadol for moderate to severe pain Recommend sports medicine evaluation.  Referral placed today. May benefit from physical therapy. Needs also follow-up with her PCP next week.

## 2024-01-23 NOTE — Patient Instructions (Signed)
 Acute Back Pain, Adult Acute back pain is sudden and usually short-lived. It is often caused by an injury to the muscles and tissues in the back. The injury may result from: A muscle, tendon, or ligament getting overstretched or torn. Ligaments are tissues that connect bones to each other. Lifting something improperly can cause a back strain. Wear and tear (degeneration) of the spinal disks. Spinal disks are circular tissue that provide cushioning between the bones of the spine (vertebrae). Twisting motions, such as while playing sports or doing yard work. A hit to the back. Arthritis. You may have a physical exam, lab tests, and imaging tests to find the cause of your pain. Acute back pain usually goes away with rest and home care. Follow these instructions at home: Managing pain, stiffness, and swelling Take over-the-counter and prescription medicines only as told by your health care provider. Treatment may include medicines for pain and inflammation that are taken by mouth or applied to the skin, or muscle relaxants. Your health care provider may recommend applying ice during the first 24-48 hours after your pain starts. To do this: Put ice in a plastic bag. Place a towel between your skin and the bag. Leave the ice on for 20 minutes, 2-3 times a day. Remove the ice if your skin turns bright red. This is very important. If you cannot feel pain, heat, or cold, you have a greater risk of damage to the area. If directed, apply heat to the affected area as often as told by your health care provider. Use the heat source that your health care provider recommends, such as a moist heat pack or a heating pad. Place a towel between your skin and the heat source. Leave the heat on for 20-30 minutes. Remove the heat if your skin turns bright red. This is especially important if you are unable to feel pain, heat, or cold. You have a greater risk of getting burned. Activity  Do not stay in bed. Staying in  bed for more than 1-2 days can delay your recovery. Sit up and stand up straight. Avoid leaning forward when you sit or hunching over when you stand. If you work at a desk, sit close to it so you do not need to lean over. Keep your chin tucked in. Keep your neck drawn back, and keep your elbows bent at a 90-degree angle (right angle). Sit high and close to the steering wheel when you drive. Add lower back (lumbar) support to your car seat, if needed. Take short walks on even surfaces as soon as you are able. Try to increase the length of time you walk each day. Do not sit, drive, or stand in one place for more than 30 minutes at a time. Sitting or standing for long periods of time can put stress on your back. Do not drive or use heavy machinery while taking prescription pain medicine. Use proper lifting techniques. When you bend and lift, use positions that put less stress on your back: [place] your knees. Keep the load close to your body. Avoid twisting. Exercise regularly as told by your health care provider. Exercising helps your back heal faster and helps prevent back injuries by keeping muscles strong and flexible. Work with a physical therapist to make a safe exercise program, as recommended by your health care provider. Do any exercises as told by your physical therapist. Lifestyle Maintain a healthy weight. Extra weight puts stress on your back and makes it difficult to have good  posture. Avoid activities or situations that make you feel anxious or stressed. Stress and anxiety increase muscle tension and can make back pain worse. Learn ways to manage anxiety and stress, such as through exercise. General instructions Sleep on a firm mattress in a comfortable position. Try lying on your side with your knees slightly bent. If you lie on your back, put a pillow under your knees. Keep your head and neck in a straight line with your spine (neutral position) when using electronic equipment like  smartphones or pads. To do this: Raise your smartphone or pad to look at it instead of bending your head or neck to look down. Put the smartphone or pad at the level of your face while looking at the screen. Follow your treatment plan as told by your health care provider. This may include: Cognitive or behavioral therapy. Acupuncture or massage therapy. Meditation or yoga. Contact a health care provider if: You have pain that is not relieved with rest or medicine. You have increasing pain going down into your legs or buttocks. Your pain does not improve after 2 weeks. You have pain at night. You lose weight without trying. You have a fever or chills. You develop nausea or vomiting. You develop abdominal pain. Get help right away if: You develop new bowel or bladder control problems. You have unusual weakness or numbness in your arms or legs. You feel faint. These symptoms may represent a serious problem that is an emergency. Do not wait to see if the symptoms will go away. Get medical help right away. Call your local emergency services (911 in the [place] Do not drive yourself to the hospital. Summary Acute back pain is sudden and usually short-lived. Use proper lifting techniques. When you bend and lift, use positions that put less stress on your back. Take over-the-counter and prescription medicines only as told by your health care provider, and apply heat or ice as told. This information is not intended to replace advice given to you by your health care provider. Make sure you discuss any questions you have with your health care provider. Document Revised: 04/02/2020 Document Reviewed: 04/02/2020 [employer]vier Patient Education  2024 [employer].
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
## 2024-02-03 ENCOUNTER — Other Ambulatory Visit: Payer: Self-pay | Admitting: Hematology and Oncology
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
## 2024-02-05 ENCOUNTER — Ambulatory Visit: Admitting: Sports Medicine

## 2024-02-05 ENCOUNTER — Encounter: Payer: Self-pay | Admitting: Internal Medicine

## 2024-02-05 VITALS — BP 130/82 | HR 103 | Ht 65.0 in | Wt 206.0 lb

## 2024-02-05 DIAGNOSIS — M545 Low back pain, unspecified: Secondary | ICD-10-CM

## 2024-02-05 MED ORDER — MELOXICAM 15 MG PO TABS: ORAL_TABLET | ORAL | 0 refills | Status: AC

## 2024-02-05 NOTE — Patient Instructions (Signed)
-   Start meloxicam  15 mg daily x2 weeks.  If still having pain after 2 weeks, complete 3rd-week of NSAID. May use remaining NSAID as needed once daily for pain control.  Do not to use additional over-the-counter NSAIDs (ibuprofen , naproxen, Advil , Aleve, etc.) while taking prescription NSAIDs.  May use Tylenol  [phone] mg 2 to 3 times a day for breakthrough pain.  Low back HEP   Heating pads over areas of pain   4 week follow up

## 2024-03-04 ENCOUNTER — Ambulatory Visit: Admitting: Sports Medicine
